# Patient Record
Sex: Male | Born: 1935 | Race: White | Hispanic: No | Smoking: Former smoker
Health system: Southern US, Community
[De-identification: ages and names within clinical notes are randomized; demographics above are authoritative.]

## PROBLEM LIST (undated history)

## (undated) DIAGNOSIS — H3530 Unspecified macular degeneration: Secondary | ICD-10-CM

## (undated) DIAGNOSIS — N189 Chronic kidney disease, unspecified: Secondary | ICD-10-CM

## (undated) DIAGNOSIS — G4730 Sleep apnea, unspecified: Secondary | ICD-10-CM

## (undated) DIAGNOSIS — R29898 Other symptoms and signs involving the musculoskeletal system: Secondary | ICD-10-CM

## (undated) DIAGNOSIS — M1990 Unspecified osteoarthritis, unspecified site: Secondary | ICD-10-CM

## (undated) DIAGNOSIS — H269 Unspecified cataract: Secondary | ICD-10-CM

## (undated) DIAGNOSIS — E119 Type 2 diabetes mellitus without complications: Secondary | ICD-10-CM

## (undated) DIAGNOSIS — I10 Essential (primary) hypertension: Secondary | ICD-10-CM

## (undated) DIAGNOSIS — R0600 Dyspnea, unspecified: Secondary | ICD-10-CM

## (undated) HISTORY — PX: BACK SURGERY: SHX140

## (undated) HISTORY — DX: Unspecified cataract: H26.9

## (undated) HISTORY — DX: Type 2 diabetes mellitus without complications: E11.9

## (undated) HISTORY — DX: Essential (primary) hypertension: I10

## (undated) HISTORY — PX: ANTERIOR CERVICAL DECOMP/DISCECTOMY FUSION: SHX1161

## (undated) HISTORY — PX: COLONOSCOPY: SHX174

## (undated) HISTORY — PX: KNEE SURGERY: SHX244

## (undated) HISTORY — DX: Unspecified macular degeneration: H35.30

## (undated) HISTORY — PX: TONSILLECTOMY: SUR1361

## (undated) HISTORY — PX: EYE SURGERY: SHX253

## (undated) SURGERY — Surgical Case
Anesthesia: *Unknown

---

## 2012-01-04 ENCOUNTER — Other Ambulatory Visit: Payer: Self-pay | Admitting: Sports Medicine

## 2012-01-04 ENCOUNTER — Ambulatory Visit (INDEPENDENT_AMBULATORY_CARE_PROVIDER_SITE_OTHER): Payer: Medicare PPO

## 2012-01-04 ENCOUNTER — Ambulatory Visit (INDEPENDENT_AMBULATORY_CARE_PROVIDER_SITE_OTHER): Payer: Medicare PPO | Admitting: Sports Medicine

## 2012-01-04 ENCOUNTER — Encounter: Payer: Self-pay | Admitting: Sports Medicine

## 2012-01-04 VITALS — BP 162/92 | HR 80 | Wt 300.0 lb

## 2012-01-04 DIAGNOSIS — R609 Edema, unspecified: Secondary | ICD-10-CM

## 2012-01-04 DIAGNOSIS — M1710 Unilateral primary osteoarthritis, unspecified knee: Secondary | ICD-10-CM

## 2012-01-04 DIAGNOSIS — N289 Disorder of kidney and ureter, unspecified: Secondary | ICD-10-CM | POA: Insufficient documentation

## 2012-01-04 DIAGNOSIS — Z0000 Encounter for general adult medical examination without abnormal findings: Secondary | ICD-10-CM | POA: Insufficient documentation

## 2012-01-04 DIAGNOSIS — R600 Localized edema: Secondary | ICD-10-CM

## 2012-01-04 DIAGNOSIS — M1712 Unilateral primary osteoarthritis, left knee: Secondary | ICD-10-CM

## 2012-01-04 DIAGNOSIS — Z299 Encounter for prophylactic measures, unspecified: Secondary | ICD-10-CM

## 2012-01-04 DIAGNOSIS — R29898 Other symptoms and signs involving the musculoskeletal system: Secondary | ICD-10-CM

## 2012-01-04 DIAGNOSIS — I10 Essential (primary) hypertension: Secondary | ICD-10-CM

## 2012-01-04 DIAGNOSIS — L821 Other seborrheic keratosis: Secondary | ICD-10-CM | POA: Insufficient documentation

## 2012-01-04 DIAGNOSIS — IMO0002 Reserved for concepts with insufficient information to code with codable children: Secondary | ICD-10-CM

## 2012-01-04 DIAGNOSIS — E785 Hyperlipidemia, unspecified: Secondary | ICD-10-CM

## 2012-01-04 DIAGNOSIS — M25569 Pain in unspecified knee: Secondary | ICD-10-CM

## 2012-01-04 DIAGNOSIS — L989 Disorder of the skin and subcutaneous tissue, unspecified: Secondary | ICD-10-CM

## 2012-01-04 DIAGNOSIS — G4730 Sleep apnea, unspecified: Secondary | ICD-10-CM

## 2012-01-04 DIAGNOSIS — Z23 Encounter for immunization: Secondary | ICD-10-CM

## 2012-01-04 DIAGNOSIS — N189 Chronic kidney disease, unspecified: Secondary | ICD-10-CM | POA: Insufficient documentation

## 2012-01-04 MED ORDER — AMBULATORY NON FORMULARY MEDICATION: Status: DC

## 2012-01-04 NOTE — Assessment & Plan Note (Signed)
Script for new CPAP machine

## 2012-01-04 NOTE — Assessment & Plan Note (Signed)
Needs Tdap and influenza. Up-to-date on Zostavax, Pneumovax, colonoscopy, PSA.

## 2012-01-04 NOTE — Assessment & Plan Note (Signed)
These appear to be multiple seborrheic keratoses. Return to have cryotherapy.

## 2012-01-04 NOTE — Assessment & Plan Note (Signed)
Currently fasting, checking lipid panel.

## 2012-01-04 NOTE — Progress Notes (Signed)
Subjective:    CC: Establish care.   HPI:  Hypertension: Has been controlled in the past on lisinopril/hydrochlorothiazide.  Hyperlipidemia: Needs recheck of lipids.  Skin lesions: Multiple, on face, and back, would like these checked.  Sleep apnea: Needs a prescription for a new CPAP machine.  Preventative measures: Up-to-date on colonoscopy, shingles vaccine, Pneumovax. Needs flu and tetanus shots.  Past medical history, Surgical history, Family history, Social history, Allergies, and medications have been entered into the medical record, reviewed, and no changes needed.   Review of Systems: No headache, visual changes, nausea, vomiting, diarrhea, constipation, dizziness, abdominal pain, skin rash, fevers, chills, night sweats, swollen lymph nodes, weight loss, chest pain, body aches, joint swelling, muscle aches, shortness of breath, mood changes, visual or auditory hallucinations.  Objective:    General: Well Developed, well nourished, and in no acute distress.  Neuro: Alert and oriented x3, extra-ocular muscles intact.  HEENT: Normocephalic, atraumatic, pupils equal round reactive to light, neck supple, no masses, no lymphadenopathy, thyroid nonpalpable.  Skin: Warm and dry, no rashes noted. Multiple seborrheic keratoses noted on back and face. Cardiac: Regular rate and rhythm, no murmurs rubs or gallops.  Respiratory: Clear to auscultation bilaterally. Not using accessory muscles, speaking in full sentences.  Abdominal: Soft, nontender, nondistended, positive bowel sounds, no masses, no organomegaly.  Musculoskeletal: Shoulder, elbow, wrist, hip, knee, ankle stable, and with full range of motion.  He does have 2+ pitting edema in both lower extremities.  Impression and Recommendations:    The patient was counselled, risk factors were discussed, anticipatory guidance given.  I spent over one hour with this patient, greater than 50% was face-to-face counseling.

## 2012-01-04 NOTE — Assessment & Plan Note (Signed)
Checking CMET, urinalysis, BNP, TSH.

## 2012-01-04 NOTE — Assessment & Plan Note (Signed)
Continue current medications. We'll check ambulatory home blood pressures. We will make changes as needed at his return visit in 2 weeks.

## 2012-01-05 LAB — CBC
HCT: 46.4 % (ref 39.0–52.0)
Hemoglobin: 16.2 g/dL (ref 13.0–17.0)
MCH: 30.7 pg (ref 26.0–34.0)
MCHC: 34.9 g/dL (ref 30.0–36.0)
MCV: 87.9 fL (ref 78.0–100.0)
Platelets: 143 K/uL — ABNORMAL LOW (ref 150–400)
RBC: 5.28 MIL/uL (ref 4.22–5.81)
RDW: 14.3 % (ref 11.5–15.5)
WBC: 9.7 10*3/uL (ref 4.0–10.5)

## 2012-01-05 LAB — URINALYSIS
Bilirubin Urine: NEGATIVE
Glucose, UA: NEGATIVE mg/dL
Hgb urine dipstick: NEGATIVE
Ketones, ur: NEGATIVE mg/dL
Nitrite: NEGATIVE
Protein, ur: NEGATIVE mg/dL
Specific Gravity, Urine: 1.011 (ref 1.005–1.030)
Urobilinogen, UA: 0.2 mg/dL (ref 0.0–1.0)
pH: 6 (ref 5.0–8.0)

## 2012-01-05 LAB — LIPID PANEL
Cholesterol: 173 mg/dL (ref 0–200)
HDL: 38 mg/dL — ABNORMAL LOW (ref >39)
LDL Cholesterol: 95 mg/dL (ref 0–99)
Total CHOL/HDL Ratio: 4.6 ratio
Triglycerides: 202 mg/dL — ABNORMAL HIGH (ref <150)
VLDL: 40 mg/dL (ref 0–40)

## 2012-01-05 LAB — COMPREHENSIVE METABOLIC PANEL
Albumin: 4.7 g/dL (ref 3.5–5.2)
BUN: 15 mg/dL (ref 6–23)
Calcium: 9.4 mg/dL (ref 8.4–10.5)
Chloride: 103 mEq/L (ref 96–112)
Creat: 1.08 mg/dL (ref 0.50–1.35)
Glucose, Bld: 86 mg/dL (ref 70–99)
Potassium: 4.4 mEq/L (ref 3.5–5.3)

## 2012-01-05 LAB — BRAIN NATRIURETIC PEPTIDE: Brain Natriuretic Peptide: 6.2 pg/mL (ref 0.0–100.0)

## 2012-01-05 LAB — COMPREHENSIVE METABOLIC PANEL WITH GFR
ALT: 25 U/L (ref 0–53)
AST: 23 U/L (ref 0–37)
Alkaline Phosphatase: 33 U/L — ABNORMAL LOW (ref 39–117)
CO2: 26 meq/L (ref 19–32)
Sodium: 140 meq/L (ref 135–145)
Total Bilirubin: 0.6 mg/dL (ref 0.3–1.2)
Total Protein: 6.8 g/dL (ref 6.0–8.3)

## 2012-01-05 LAB — TSH: TSH: 2.392 u[IU]/mL (ref 0.350–4.500)

## 2012-01-18 ENCOUNTER — Encounter: Payer: Self-pay | Admitting: Sports Medicine

## 2012-01-18 ENCOUNTER — Ambulatory Visit (INDEPENDENT_AMBULATORY_CARE_PROVIDER_SITE_OTHER): Payer: Medicare PPO | Admitting: Sports Medicine

## 2012-01-18 VITALS — BP 143/88 | HR 66 | Wt 299.0 lb

## 2012-01-18 DIAGNOSIS — IMO0002 Reserved for concepts with insufficient information to code with codable children: Secondary | ICD-10-CM

## 2012-01-18 DIAGNOSIS — I10 Essential (primary) hypertension: Secondary | ICD-10-CM

## 2012-01-18 DIAGNOSIS — R609 Edema, unspecified: Secondary | ICD-10-CM

## 2012-01-18 DIAGNOSIS — M179 Osteoarthritis of knee, unspecified: Secondary | ICD-10-CM | POA: Insufficient documentation

## 2012-01-18 DIAGNOSIS — R600 Localized edema: Secondary | ICD-10-CM

## 2012-01-18 DIAGNOSIS — G4730 Sleep apnea, unspecified: Secondary | ICD-10-CM

## 2012-01-18 DIAGNOSIS — M1710 Unilateral primary osteoarthritis, unspecified knee: Secondary | ICD-10-CM

## 2012-01-18 MED ORDER — MELOXICAM 15 MG PO TABS: ORAL_TABLET | ORAL | Status: DC

## 2012-01-18 MED ORDER — AMLODIPINE BESYLATE 10 MG PO TABS: 5.0000 mg | ORAL_TABLET | Freq: Every day | ORAL | Status: DC

## 2012-01-18 MED ORDER — AMBULATORY NON FORMULARY MEDICATION: Status: DC

## 2012-01-18 NOTE — Assessment & Plan Note (Signed)
Insufficiently controlled with lisinopril/hydrochlorothiazide. I would like to amlodipine 5 mg daily. Come back to see me in 4 weeks.

## 2012-01-18 NOTE — Assessment & Plan Note (Addendum)
Benign.

## 2012-01-18 NOTE — Assessment & Plan Note (Signed)
Ultrasound guided injection as above. Knee strapped elastic bandage. Come back in one month.

## 2012-01-18 NOTE — Assessment & Plan Note (Signed)
Needs humidifier.

## 2012-01-18 NOTE — Progress Notes (Signed)
Subjective:    CC: Followup  HPI: Hypertension: Has been on lisinopril/hydrochlorothiazide for about 2 weeks. No adverse effects.  Left knee pain: With known osteoarthritis, severe, bone-on-bone. His last injection was about 4 months ago and lasted until today. Pain is localized, doesn't radiate, severe. Feels tight.  Past medical history, Surgical history, Family history, Social history, Allergies, and medications have been entered into the medical record, reviewed, and no changes needed.   Review of Systems: No fevers, chills, night sweats, weight loss, chest pain, or shortness of breath.   Objective:    General: Well Developed, well nourished, and in no acute distress.  Neuro: Alert and oriented x3, extra-ocular muscles intact.  HEENT: Normocephalic, atraumatic, pupils equal round reactive to light, neck supple, no masses, no lymphadenopathy, thyroid nonpalpable.  Skin: Warm and dry, no rashes. Cardiac: Regular rate and rhythm, no murmurs rubs or gallops.  Respiratory: Clear to auscultation bilaterally. Not using accessory muscles, speaking in full sentences. Left knee: Normal to inspection with no erythema or effusion or obvious bony abnormalities. Palpation normal with no warmth, joint line tenderness, patellar tenderness, or condyle tenderness. ROM full in flexion and extension and lower leg rotation. Ligaments with solid consistent endpoints including ACL, PCL, LCL, MCL. Negative Mcmurray's, Apley's, and Thessalonian tests. Non painful patellar compression. Patellar glide without crepitus. Patellar and quadriceps tendons unremarkable. Hamstring and quadriceps strength is normal.   Procedure: Real-time Ultrasound Guided Injection of left knee Device: GE Logiq E  Ultrasound guided injection is preferred based studies that show increased duration, increased effect, greater accuracy, decreased procedural pain, increased response rate, and decreased cost with ultrasound guided  versus blind injection.  Verbal informed consent obtained.  Time-out conducted.  Noted no overlying erythema, induration, or other signs of local infection.  Skin prepped in a sterile fashion.  Local anesthesia: Topical Ethyl chloride.  With sterile technique and under real time ultrasound guidance:  2 cc triamcinolone, 4 cc lidocaine injected into the joint.  The knee was then strapped. Completed without difficulty  Pain immediately resolved suggesting accurate placement of the medication.  Advised to call if fevers/chills, erythema, induration, drainage, or persistent bleeding.  Images permanently stored and available for review in the ultrasound unit.  Impression: Technically successful ultrasound guided injection.  Impression and Recommendations:

## 2012-01-21 ENCOUNTER — Other Ambulatory Visit: Payer: Self-pay | Admitting: Sports Medicine

## 2012-01-21 DIAGNOSIS — I10 Essential (primary) hypertension: Secondary | ICD-10-CM

## 2012-01-22 ENCOUNTER — Other Ambulatory Visit: Payer: Self-pay | Admitting: *Deleted

## 2012-01-22 MED ORDER — LISINOPRIL-HYDROCHLOROTHIAZIDE 20-25 MG PO TABS: 1.0000 | ORAL_TABLET | Freq: Every day | ORAL | Status: DC

## 2012-01-29 ENCOUNTER — Ambulatory Visit (INDEPENDENT_AMBULATORY_CARE_PROVIDER_SITE_OTHER): Payer: Medicare PPO | Admitting: Sports Medicine

## 2012-01-29 ENCOUNTER — Encounter: Payer: Self-pay | Admitting: Sports Medicine

## 2012-01-29 ENCOUNTER — Ambulatory Visit (INDEPENDENT_AMBULATORY_CARE_PROVIDER_SITE_OTHER): Payer: Medicare PPO

## 2012-01-29 VITALS — BP 130/73 | HR 94 | Wt 297.0 lb

## 2012-01-29 DIAGNOSIS — M179 Osteoarthritis of knee, unspecified: Secondary | ICD-10-CM

## 2012-01-29 DIAGNOSIS — M48062 Spinal stenosis, lumbar region with neurogenic claudication: Secondary | ICD-10-CM

## 2012-01-29 DIAGNOSIS — I10 Essential (primary) hypertension: Secondary | ICD-10-CM

## 2012-01-29 DIAGNOSIS — M5137 Other intervertebral disc degeneration, lumbosacral region: Secondary | ICD-10-CM

## 2012-01-29 DIAGNOSIS — IMO0002 Reserved for concepts with insufficient information to code with codable children: Secondary | ICD-10-CM

## 2012-01-29 DIAGNOSIS — M1710 Unilateral primary osteoarthritis, unspecified knee: Secondary | ICD-10-CM

## 2012-01-29 MED ORDER — GABAPENTIN 300 MG PO CAPS: ORAL_CAPSULE | ORAL | Status: DC

## 2012-01-29 MED ORDER — PREDNISONE 50 MG PO TABS: ORAL_TABLET | ORAL | Status: DC

## 2012-01-29 NOTE — Assessment & Plan Note (Signed)
Improved significantly, still awaiting the lisinopril/hydrochlorothiazide combination pill from his mail-order pharmacy. 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

## 2012-01-29 NOTE — Progress Notes (Signed)
SPORTS MEDICINE CONSULTATION REPORT  Subjective:    CC: Feet numbness  HPI: Steven Patton comes back to see me to discuss 3-4 years of numbness that he localizes on the medial aspect and plantar aspect of both feet, radiating down his legs. He notes only mild back pain. He denies any trauma. He notes that this is particularly bad associated with calf cramping after walking for long distances, flexing his spine relieves all symptoms. In fact, he does particularly like to push the grocery cart. He denies any bowel or bladder changes. He also has had ABIs in the past that were negative. He's never had any evaluation or treatment for lumbar spinal stenosis.  Past medical history, Surgical history, Family history, Social history, Allergies, and medications have been entered into the medical record, reviewed, and no changes needed.   Review of Systems: No headache, visual changes, nausea, vomiting, diarrhea, constipation, dizziness, abdominal pain, skin rash, fevers, chills, night sweats, weight loss, swollen lymph nodes, body aches, joint swelling, muscle aches, chest pain, shortness of breath, mood changes, visual or auditory hallucinations.   Objective:   Vitals:  Afebrile, vital signs stable. General: Well Developed, well nourished, and in no acute distress.  Neuro/Psych: Alert and oriented x3, extra-ocular muscles intact, able to move all 4 extremities.  Skin: Warm and dry, no rashes noted.  Respiratory: Not using accessory muscles, speaking in full sentences, trachea midline.  Cardiovascular: Pulses palpable, no extremity edema. Abdomen: Does not appear distended. Back Exam:  Inspection: Unremarkable  Motion: Flexion 45 deg, Extension 45 deg, Side Bending to 45 deg bilaterally,  Rotation to 45 deg bilaterally  SLR laying: Negative  XSLR laying: Negative  Palpable tenderness: None. FABER: negative. Sensory change: Gross sensation intact to all lumbar and sacral dermatomes.  Reflexes: 2+ at both  patellar tendons, 2+ at achilles tendons, Babinski's downgoing.  Strength at foot  Plantar-flexion: 5/5 Dorsi-flexion: 5/5 Eversion: 5/5 Inversion: 5/5  Leg strength  Quad: 5/5 Hamstring: 5/5 Hip flexor: 5/5 Hip abductors: 5/5   Impression and Recommendations:   This case required medical decision making of moderate complexity.

## 2012-01-29 NOTE — Assessment & Plan Note (Signed)
Doing very well status post injection.

## 2012-01-29 NOTE — Patient Instructions (Addendum)
Spinal Stenosis One cause of back pain is spinal stenosis. Stenosis means abnormal narrowing. The spinal canal contains and protects the spinal nerve roots. In spinal stenosis, the spinal canal narrows and pinches the spinal cord and nerves. This causes low back pain and pain in the legs. Stenosis may pinch the nerves that control muscles and sensation in the legs. This leads to pain and abnormal feelings in the leg muscles and areas supplied by those nerves. CAUSES  Spinal stenosis often happens to people as they get older and arthritic boney growths occur in their spinal canal. There is also a loss of the disk height between the bones of the back, which also adds to this problem. Sometimes the problem is present at birth. SYMPTOMS   Pain that is generally worse with activities, particularly standing and walking.  Numbness, tingling, hot or cold feelings, weakness, or a weariness in the legs.  Clumsiness, frequent falling, and a foot-slapping gait, which may come as a result of nerve pressure and muscle weakness. DIAGNOSIS   Your caregiver may suspect spinal stenosis if you have unusual leg symptoms, such as those previously mentioned.  Your orthopedic surgeon may request special imaging exams, such a computerized magnetic scan (MRI) or computerized X-ray scan (CT) to find out the cause of the problem. TREATMENT   Sometimes treatments such as postural changes or nonsteroidal anti-inflammatory drugs will relieve the pain.  Nonsteroidal anti-inflammatory medications may help relieve symptoms. These medicines do this by decreasing swelling and inflammation in the nerves.  When stenosis causes severe nerve root compression, conservative treatment may not be enough to maintain a normal lifestyle. Surgery may be recommended to relieve the pressure on affected nerves. In properly selected patients, the results are very good, and patients are able to continue a normal lifestyle. HOME CARE  INSTRUCTIONS   Flexing the spine by leaning forward while walking may relieve symptoms. Lying with the knees drawn up to the chest may offer some relief. These positions enlarge the space available to the nerves. They may make it easier for stenosis sufferers to walk longer distances.  Rest, followed by gradually resuming activity, also can help.  Aerobic activity, such as bicycling or swimming, is often recommended.  Losing weight can also relieve some of the load on the spine.  Application of warm or cold compresses to the area of pain can be helpful. SEEK MEDICAL CARE IF:   The periods of relief between episodes of pain become shorter and shorter.  You experience pain that radiates down your leg, even when you are not standing or walking. SEEK IMMEDIATE MEDICAL CARE IF:   You have a loss of bowel or bladder control.  You have a sudden loss of feeling in your legs.  You suddenly cannot move your legs. Document Released: 04/11/2003 Document Revised: 04/13/2011 Document Reviewed: 06/06/2009 [employer] Patient Information 2013 [employer].  

## 2012-01-29 NOTE — Assessment & Plan Note (Signed)
Symptoms are highly suggestive of the above. I do suspect L5 nerve root bilaterally. Prednisone for 5 days, gabapentin, formal physical therapy, x-rays. He will come back to see me after 4 weeks of therapy and if no better we can proceed with MRI.  This will be for interventional injection plan.

## 2012-02-08 ENCOUNTER — Ambulatory Visit: Payer: Medicare PPO | Attending: Sports Medicine | Admitting: Physical Therapy

## 2012-02-08 DIAGNOSIS — IMO0001 Reserved for inherently not codable concepts without codable children: Secondary | ICD-10-CM | POA: Insufficient documentation

## 2012-02-08 DIAGNOSIS — M4040 Postural lordosis, site unspecified: Secondary | ICD-10-CM | POA: Insufficient documentation

## 2012-02-08 DIAGNOSIS — M6281 Muscle weakness (generalized): Secondary | ICD-10-CM | POA: Insufficient documentation

## 2012-02-08 DIAGNOSIS — R293 Abnormal posture: Secondary | ICD-10-CM | POA: Insufficient documentation

## 2012-02-11 ENCOUNTER — Ambulatory Visit: Payer: Medicare PPO | Admitting: Physical Therapy

## 2012-02-15 ENCOUNTER — Ambulatory Visit: Payer: Medicare PPO | Admitting: Physical Therapy

## 2012-02-18 ENCOUNTER — Ambulatory Visit: Payer: Medicare PPO | Admitting: Physical Therapy

## 2012-02-19 ENCOUNTER — Ambulatory Visit: Payer: Medicare PPO | Admitting: Sports Medicine

## 2012-02-22 ENCOUNTER — Ambulatory Visit: Payer: Medicare PPO | Admitting: Physical Therapy

## 2012-02-25 ENCOUNTER — Ambulatory Visit: Payer: Medicare PPO | Admitting: Physical Therapy

## 2012-02-26 ENCOUNTER — Ambulatory Visit (INDEPENDENT_AMBULATORY_CARE_PROVIDER_SITE_OTHER): Payer: Medicare PPO | Admitting: Sports Medicine

## 2012-02-26 ENCOUNTER — Encounter: Payer: Self-pay | Admitting: Sports Medicine

## 2012-02-26 VITALS — BP 156/77 | HR 68 | Wt 300.0 lb

## 2012-02-26 DIAGNOSIS — IMO0002 Reserved for concepts with insufficient information to code with codable children: Secondary | ICD-10-CM

## 2012-02-26 DIAGNOSIS — M48062 Spinal stenosis, lumbar region with neurogenic claudication: Secondary | ICD-10-CM

## 2012-02-26 DIAGNOSIS — M179 Osteoarthritis of knee, unspecified: Secondary | ICD-10-CM

## 2012-02-26 DIAGNOSIS — M1710 Unilateral primary osteoarthritis, unspecified knee: Secondary | ICD-10-CM

## 2012-02-26 DIAGNOSIS — I10 Essential (primary) hypertension: Secondary | ICD-10-CM

## 2012-02-26 DIAGNOSIS — I209 Angina pectoris, unspecified: Secondary | ICD-10-CM

## 2012-02-26 MED ORDER — NITROGLYCERIN 0.4 MG SL SUBL: 0.4000 mg | SUBLINGUAL_TABLET | SUBLINGUAL | Status: DC | PRN

## 2012-02-26 MED ORDER — ASPIRIN EC 81 MG PO TBEC: 81.0000 mg | DELAYED_RELEASE_TABLET | Freq: Every day | ORAL | Status: DC

## 2012-02-26 MED ORDER — METOPROLOL TARTRATE 25 MG PO TABS: 25.0000 mg | ORAL_TABLET | Freq: Two times a day (BID) | ORAL | Status: DC

## 2012-02-26 NOTE — Assessment & Plan Note (Signed)
Pain free.

## 2012-02-26 NOTE — Progress Notes (Signed)
Subjective:    CC: Followup  HPI: Spinal stenosis: Has been to formal physical therapy, oral steroids, muscle relaxers, gabapentin. Currently has zero pain, does still get some numbness in his feet with ambulation which resolves with forward flexion, but does not desire any further intervention due to lack of pain.  Hypertension: Doing well with current medications.  Angina: 2 weeks ago noted an episode of right-sided substernal chest pain that was sharp, nonpleuritic, and occurred at rest. It lasted approximately 15 minutes, radiated into his neck and jaw, but not his arm, he denies any presyncope, nausea, or diaphoresis during this episode. He is able to push the grocery cart around, and does not get any symptoms. He last had a stress test at least 5 years ago.  Knee osteoarthritis: pain is completely resolved status post intra-articular injection.  Past medical history, Surgical history, Family history not pertinant except as noted below, Social history, Allergies, and medications have been entered into the medical record, reviewed, and no changes needed.   Review of Systems: No fevers, chills, night sweats, weight loss, chest pain, or shortness of breath.   Objective:    General: Well Developed, well nourished, and in no acute distress.  Neuro: Alert and oriented x3, extra-ocular muscles intact, sensation grossly intact.  HEENT: Normocephalic, atraumatic, pupils equal round reactive to light, neck supple, no masses, no lymphadenopathy, thyroid nonpalpable.  Skin: Warm and dry, no rashes. Cardiac: Regular rate and rhythm, no murmurs rubs or gallops. Pain in chest is not reproducible with palpation. Respiratory: Clear to auscultation bilaterally. Not using accessory muscles, speaking in full sentences.  Impression and Recommendations:

## 2012-02-26 NOTE — Assessment & Plan Note (Signed)
Pain resolved after physical therapy. Still has some numbness with walking, but desires not to further investigate this due to no further pain.

## 2012-02-26 NOTE — Assessment & Plan Note (Signed)
Increase amlodipine to one full tablet. Recheck in 2 weeks.

## 2012-02-26 NOTE — Assessment & Plan Note (Signed)
Single episode 2 weeks ago, radiating to the jaws, no nausea, no diaphoresis, was at rest. No pre-syncope. I do think he needs to see our cardiologist for stress testing, I'm going to give him some nitroglycerin, aspirin, and place him on low-dose metoprolol.

## 2012-03-11 ENCOUNTER — Ambulatory Visit: Payer: Medicare PPO | Admitting: Sports Medicine

## 2012-03-14 ENCOUNTER — Encounter: Payer: Self-pay | Admitting: Sports Medicine

## 2012-03-14 ENCOUNTER — Ambulatory Visit (INDEPENDENT_AMBULATORY_CARE_PROVIDER_SITE_OTHER): Payer: Medicare PPO | Admitting: Sports Medicine

## 2012-03-14 VITALS — BP 132/76 | HR 60 | Wt 297.0 lb

## 2012-03-14 DIAGNOSIS — I10 Essential (primary) hypertension: Secondary | ICD-10-CM

## 2012-03-14 DIAGNOSIS — I209 Angina pectoris, unspecified: Secondary | ICD-10-CM

## 2012-03-14 NOTE — Progress Notes (Signed)
Subjective:    CC: Followup  HPI: Hypertension: Currently well controlled on amlodipine 10, lisinopril/hydrochlorothiazide maximal dose, metoprolol.  Angina: Has not had any further episodes since I saw him, he does keep the nitroglycerin in his pocket. He is not yet gotten his visit with the cardiologist.  Past medical history, Surgical history, Family history not pertinant except as noted below, Social history, Allergies, and medications have been entered into the medical record, reviewed, and no changes needed.   Review of Systems: No fevers, chills, night sweats, weight loss, chest pain, or shortness of breath.   Objective:    General: Well Developed, well nourished, and in no acute distress.  Neuro: Alert and oriented x3, extra-ocular muscles intact, sensation grossly intact.  HEENT: Normocephalic, atraumatic, pupils equal round reactive to light, neck supple, no masses, no lymphadenopathy, thyroid nonpalpable.  Skin: Warm and dry, no rashes. Cardiac: Regular rate and rhythm, no murmurs rubs or gallops.  Respiratory: Clear to auscultation bilaterally. Not using accessory muscles, speaking in full sentences. Impression and Recommendations:    I spent 20 minutes with this patient, greater than 50% was face-to-face counseling regarding hypertension and angina pectoris.

## 2012-03-14 NOTE — Assessment & Plan Note (Signed)
I think that Steven Patton is doing very well in regards to his blood pressure. No changes.

## 2012-03-14 NOTE — Assessment & Plan Note (Signed)
Has still not been scheduled for his visit with the cardiologist. He's really not had any further episodes of angina, but he does have some nitroglycerin on hand just in case. I do think he needs to be stressed, or go straight to catheterization. We will make sure this is set up before he leaves today.

## 2012-04-28 ENCOUNTER — Other Ambulatory Visit: Payer: Self-pay | Admitting: *Deleted

## 2012-04-28 DIAGNOSIS — M1710 Unilateral primary osteoarthritis, unspecified knee: Secondary | ICD-10-CM

## 2012-04-28 MED ORDER — MELOXICAM 15 MG PO TABS: ORAL_TABLET | ORAL | Status: DC

## 2012-05-18 ENCOUNTER — Ambulatory Visit: Payer: Medicare PPO | Admitting: Sports Medicine

## 2012-05-18 ENCOUNTER — Ambulatory Visit (INDEPENDENT_AMBULATORY_CARE_PROVIDER_SITE_OTHER): Payer: Medicare PPO | Admitting: Sports Medicine

## 2012-05-18 ENCOUNTER — Encounter: Payer: Self-pay | Admitting: Sports Medicine

## 2012-05-18 VITALS — BP 121/62 | HR 75 | Wt 306.0 lb

## 2012-05-18 DIAGNOSIS — M1610 Unilateral primary osteoarthritis, unspecified hip: Secondary | ICD-10-CM

## 2012-05-18 DIAGNOSIS — M169 Osteoarthritis of hip, unspecified: Secondary | ICD-10-CM

## 2012-05-18 DIAGNOSIS — M1612 Unilateral primary osteoarthritis, left hip: Secondary | ICD-10-CM

## 2012-05-18 DIAGNOSIS — I209 Angina pectoris, unspecified: Secondary | ICD-10-CM

## 2012-05-18 DIAGNOSIS — Z96649 Presence of unspecified artificial hip joint: Secondary | ICD-10-CM | POA: Insufficient documentation

## 2012-05-18 NOTE — Progress Notes (Signed)
  Subjective:    CC: Pain  HPI: Left hip pain: This very pleasant 77 year old male comes in with a several week history of pain he localizes in his left groin, radiating to the left testicle, and to the medial aspect of the left knee but not beyond the knee. It is worse with flexion of the hip, and with weightbearing. He denies any injuries, denies any penile swelling, discharge, dysuria. Pain is sharp. Moderate to severe. Stable.  Past medical history, Surgical history, Family history not pertinant except as noted below, Social history, Allergies, and medications have been entered into the medical record, reviewed, and no changes needed.   Review of Systems: No fevers, chills, night sweats, weight loss, chest pain, or shortness of breath.   Objective:    General: Well Developed, well nourished, and in no acute distress.  Neuro: Alert and oriented x3, extra-ocular muscles intact, sensation grossly intact.  HEENT: Normocephalic, atraumatic, pupils equal round reactive to light, neck supple, no masses, no lymphadenopathy, thyroid nonpalpable.  Skin: Warm and dry, no rashes. Cardiac: Regular rate and rhythm, no murmurs rubs or gallops, no lower extremity edema.  Respiratory: Clear to auscultation bilaterally. Not using accessory muscles, speaking in full sentences. Left Hip: ROM IR: 25 and painful, ER: 45 Deg, Flexion: 120 Deg, Extension: 100 Deg, Abduction: 45 Deg, Adduction: 45 Deg Strength IR: 5/5, ER: 5/5, Flexion: 5/5, Extension: 5/5, Abduction: 5/5, Adduction: 5/5 Pelvic alignment unremarkable to inspection and palpation. Standing hip rotation and gait without trendelenburg sign / unsteadiness. Greater trochanter without tenderness to palpation. No tenderness over piriformis and greater trochanter. Reproduction of pain with flexion adduction internal rotation. No SI joint tenderness and normal minimal SI movement.  Procedure: Real-time Ultrasound Guided Injection of left femoral  acetabular joint Device: GE Logiq E  Ultrasound guided injection is preferred based studies that show increased duration, increased effect, greater accuracy, decreased procedural pain, increased response rate, and decreased cost with ultrasound guided versus blind injection.  Verbal informed consent obtained.  Time-out conducted.  Noted no overlying erythema, induration, or other signs of local infection.  Skin prepped in a sterile fashion.  Local anesthesia: Topical Ethyl chloride.  With sterile technique and under real time ultrasound guidance: Spinal needle directed towards femoral head/neck junction, once confirmed in joint capsule, 2 cc Kenalog 40, 4 cc lidocaine injected easily. Capsule seen distended with fluid.  Completed without difficulty  Pain immediately resolved suggesting accurate placement of the medication.  Advised to call if fevers/chills, erythema, induration, drainage, or persistent bleeding.  Images permanently stored and available for review in the ultrasound unit.  Impression: Technically successful ultrasound guided injection.  Impression and Recommendations:

## 2012-05-18 NOTE — Assessment & Plan Note (Signed)
Intra-articular injection as above. X-rays. Hip rehabilitation exercises. Return in 4 weeks for this.  Of note, he was to come back for custom orthotics.

## 2012-05-18 NOTE — Assessment & Plan Note (Signed)
His visit is coming up soon with the cardiologist.

## 2012-05-23 ENCOUNTER — Encounter: Payer: Self-pay | Admitting: Sports Medicine

## 2012-05-23 ENCOUNTER — Ambulatory Visit (INDEPENDENT_AMBULATORY_CARE_PROVIDER_SITE_OTHER): Payer: Medicare PPO | Admitting: Sports Medicine

## 2012-05-23 VITALS — BP 156/81 | HR 74

## 2012-05-23 DIAGNOSIS — M48062 Spinal stenosis, lumbar region with neurogenic claudication: Secondary | ICD-10-CM

## 2012-05-23 DIAGNOSIS — M1612 Unilateral primary osteoarthritis, left hip: Secondary | ICD-10-CM

## 2012-05-23 DIAGNOSIS — IMO0002 Reserved for concepts with insufficient information to code with codable children: Secondary | ICD-10-CM

## 2012-05-23 DIAGNOSIS — M179 Osteoarthritis of knee, unspecified: Secondary | ICD-10-CM

## 2012-05-23 DIAGNOSIS — M1710 Unilateral primary osteoarthritis, unspecified knee: Secondary | ICD-10-CM

## 2012-05-23 MED ORDER — TRAMADOL HCL 50 MG PO TABS: 50.0000 mg | ORAL_TABLET | Freq: Three times a day (TID) | ORAL | Status: DC | PRN

## 2012-05-23 NOTE — Assessment & Plan Note (Signed)
Left knee injected under ultrasound guidance. Custom orthotics made. Return in 4 weeks.

## 2012-05-23 NOTE — Progress Notes (Signed)
    Patient was fitted for a : standard, cushioned, semi-rigid orthotic. The orthotic was heated and afterward the patient stood on the orthotic blank positioned on the orthotic stand. The patient was positioned in subtalar neutral position and 10 degrees of ankle dorsiflexion in a weight bearing stance. After completion of molding, a stable base was applied to the orthotic blank. The blank was ground to a stable position for weight bearing. Size: 11 Base: Blue EVA Additional Posting and Padding: None The patient ambulated these, and they were very comfortable.  Procedure: Real-time Ultrasound Guided Injection of left knee  Device: GE Logiq E  Ultrasound guided injection is preferred based studies that show increased duration, increased effect, greater accuracy, decreased procedural pain, increased response rate, and decreased cost with ultrasound guided versus blind injection.  Verbal informed consent obtained.  Time-out conducted.  Noted no overlying erythema, induration, or other signs of local infection.  Skin prepped in a sterile fashion.  Local anesthesia: Topical Ethyl chloride.  With sterile technique and under real time ultrasound guidance:  2 cc Kenalog 40, 4 cc lidocaine injected easily into the suprapatellar recess. Completed without difficulty  Pain immediately resolved suggesting accurate placement of the medication.  Advised to call if fevers/chills, erythema, induration, drainage, or persistent bleeding.  Images permanently stored and available for review in the ultrasound unit.  Impression: Technically successful ultrasound guided injection.  I spent 40 minutes with this patient, greater than 50% was face-to-face time counseling regarding the below diagnosis.

## 2012-05-23 NOTE — Assessment & Plan Note (Signed)
Pain resolved after intra-articular injection.

## 2012-05-23 NOTE — Assessment & Plan Note (Signed)
Custom orthotics as above. 

## 2012-05-30 ENCOUNTER — Ambulatory Visit (INDEPENDENT_AMBULATORY_CARE_PROVIDER_SITE_OTHER): Payer: Medicare PPO | Admitting: Sports Medicine

## 2012-05-30 ENCOUNTER — Encounter: Payer: Self-pay | Admitting: Sports Medicine

## 2012-05-30 ENCOUNTER — Ambulatory Visit (INDEPENDENT_AMBULATORY_CARE_PROVIDER_SITE_OTHER): Payer: Medicare PPO

## 2012-05-30 VITALS — BP 147/80 | HR 88 | Wt 296.0 lb

## 2012-05-30 DIAGNOSIS — M169 Osteoarthritis of hip, unspecified: Secondary | ICD-10-CM

## 2012-05-30 DIAGNOSIS — IMO0002 Reserved for concepts with insufficient information to code with codable children: Secondary | ICD-10-CM

## 2012-05-30 DIAGNOSIS — M1612 Unilateral primary osteoarthritis, left hip: Secondary | ICD-10-CM

## 2012-05-30 DIAGNOSIS — M1710 Unilateral primary osteoarthritis, unspecified knee: Secondary | ICD-10-CM

## 2012-05-30 DIAGNOSIS — M179 Osteoarthritis of knee, unspecified: Secondary | ICD-10-CM

## 2012-05-30 NOTE — Assessment & Plan Note (Signed)
Continues to do well after injection. 

## 2012-05-30 NOTE — Assessment & Plan Note (Signed)
S/p 2 injections. Repeated steroid and started supartz today. Return in 1 week for injection number 2 of 5.

## 2012-05-30 NOTE — Progress Notes (Signed)
  Subjective:    CC: Followup  HPI: Hip osteoarthritis: Continues to do well after intra-articular injection.  Left knee pain: Status post 2 injections, the most recent of which was a week ago. Unfortunately pains recurred.  The most recent injection was one week ago. He's never had Visco supplementation. Pain is localized over the anterolateral knee joint, worse with weightbearing.  Past medical history, Surgical history, Family history not pertinant except as noted below, Social history, Allergies, and medications have been entered into the medical record, reviewed, and no changes needed.   Review of Systems: No fevers, chills, night sweats, weight loss, chest pain, or shortness of breath.   Objective:    General: Well Developed, well nourished, and in no acute distress.  Neuro: Alert and oriented x3, extra-ocular muscles intact, sensation grossly intact.  HEENT: Normocephalic, atraumatic, pupils equal round reactive to light, neck supple, no masses, no lymphadenopathy, thyroid nonpalpable.  Skin: Warm and dry, no rashes. Cardiac: Regular rate and rhythm, no murmurs rubs or gallops, no lower extremity edema.  Respiratory: Clear to auscultation bilaterally. Not using accessory muscles, speaking in full sentences. Left Hip: ROM IR: 45 Deg, ER: 45 Deg, Flexion: 120 Deg, Extension: 100 Deg, Abduction: 45 Deg, Adduction: 45 Deg Strength IR: 5/5, ER: 5/5, Flexion: 3+/5, Extension: 5/5, Abduction: 5/5, Adduction: 5/5 Pelvic alignment unremarkable to inspection and palpation. Standing hip rotation and gait without trendelenburg sign / unsteadiness. Greater trochanter without tenderness to palpation. No tenderness over piriformis and greater trochanter. No pain with FABER or FADIR. No SI joint tenderness and normal minimal SI movement. Left Knee: Normal to inspection with no erythema or effusion or obvious bony abnormalities. Mild medial and lateral joint line pain. ROM full in flexion  and extension and lower leg rotation. Ligaments with solid consistent endpoints including ACL, PCL, LCL, MCL. Negative Mcmurray's, Apley's, and Thessalonian tests. Non painful patellar compression. Patellar glide without crepitus. Patellar and quadriceps tendons unremarkable. Hamstring and quadriceps strength is normal.   Procedure: Real-time Ultrasound Guided Injection of left knee Device: GE Logiq E  Ultrasound guided injection is preferred based studies that show increased duration, increased effect, greater accuracy, decreased procedural pain, increased response rate, and decreased cost with ultrasound guided versus blind injection.  Verbal informed consent obtained.  Time-out conducted.  Noted no overlying erythema, induration, or other signs of local infection.  Skin prepped in a sterile fashion.  Local anesthesia: Topical Ethyl chloride.  With sterile technique and under real time ultrasound guidance:  22-gauge needle advanced into the suprapatellar recess, 2 cc Kenalog 40, 4 cc lidocaine injected easily, syringe switched and 25 mg/2.5 mL of Supartz (sodium hyaluronate) in a prefilled syringe was injected easily into the knee. Completed without difficulty  Pain immediately resolved suggesting accurate placement of the medication.  Advised to call if fevers/chills, erythema, induration, drainage, or persistent bleeding.  Images permanently stored and available for review in the ultrasound unit.  Impression: Technically successful ultrasound guided injection. Impression and Recommendations:

## 2012-06-06 ENCOUNTER — Encounter: Payer: Self-pay | Admitting: Sports Medicine

## 2012-06-06 ENCOUNTER — Ambulatory Visit (INDEPENDENT_AMBULATORY_CARE_PROVIDER_SITE_OTHER): Payer: Medicare PPO | Admitting: Sports Medicine

## 2012-06-06 VITALS — BP 128/76 | HR 82

## 2012-06-06 DIAGNOSIS — I10 Essential (primary) hypertension: Secondary | ICD-10-CM

## 2012-06-06 DIAGNOSIS — IMO0002 Reserved for concepts with insufficient information to code with codable children: Secondary | ICD-10-CM

## 2012-06-06 DIAGNOSIS — M1612 Unilateral primary osteoarthritis, left hip: Secondary | ICD-10-CM

## 2012-06-06 DIAGNOSIS — I209 Angina pectoris, unspecified: Secondary | ICD-10-CM

## 2012-06-06 DIAGNOSIS — M1712 Unilateral primary osteoarthritis, left knee: Secondary | ICD-10-CM

## 2012-06-06 DIAGNOSIS — M1710 Unilateral primary osteoarthritis, unspecified knee: Secondary | ICD-10-CM

## 2012-06-06 DIAGNOSIS — M169 Osteoarthritis of hip, unspecified: Secondary | ICD-10-CM

## 2012-06-06 DIAGNOSIS — M179 Osteoarthritis of knee, unspecified: Secondary | ICD-10-CM

## 2012-06-06 DIAGNOSIS — M1610 Unilateral primary osteoarthritis, unspecified hip: Secondary | ICD-10-CM

## 2012-06-06 MED ORDER — METOPROLOL TARTRATE 50 MG PO TABS: 50.0000 mg | ORAL_TABLET | Freq: Two times a day (BID) | ORAL | Status: DC

## 2012-06-06 NOTE — Assessment & Plan Note (Addendum)
Continue aspirin. Increasing metoprolol to 50 mg twice a day to decrease myocardial demand. Lipids were under fairly good control, without a statin. Blood pressure is under good control. Risk factors as above are fairly optimized. Continue nitroglycerin as needed. Keep close followup with the cardiologist, I do suspect he will proceed to catheterization sooner rather than later. We are going to try to get him in tomorrow or the day after rather than his appointment in 2 weeks.

## 2012-06-06 NOTE — Assessment & Plan Note (Signed)
Injection to the left hip joint today. Last injection was only one month ago. If continues to have pain, we need to consider a hip arthroplasty. Certainly we would need cardiac catheterization first.

## 2012-06-06 NOTE — Progress Notes (Signed)
  Subjective:    CC: Pain  HPI: Left knee osteoarthritis: here for Supartz injection #2 of 5, unfortunately has not yet had a response, we did place steroid and Supartz in his knee last time.  Left hip osteoarthritis: I injected his femoral acetabular joint a month ago, he had an excellent response, but unfortunately pain has recurred.  Angina:  Did have a single episode that took 3 nitroglycerin to resolve. He does have his visit coming up with the cardiologist in a couple of weeks.  This occurred at rest.  Past medical history, Surgical history, Family history not pertinant except as noted below, Social history, Allergies, and medications have been entered into the medical record, reviewed, and no changes needed.   Review of Systems: No fevers, chills, night sweats, weight loss, chest pain, or shortness of breath.   Objective:    General: Well Developed, well nourished, and in no acute distress.  Neuro: Alert and oriented x3, extra-ocular muscles intact, sensation grossly intact.  HEENT: Normocephalic, atraumatic, pupils equal round reactive to light, neck supple, no masses, no lymphadenopathy, thyroid nonpalpable.  Skin: Warm and dry, no rashes. Cardiac: Regular rate and rhythm, no murmurs rubs or gallops, no lower extremity edema.  Respiratory: Clear to auscultation bilaterally. Not using accessory muscles, speaking in full sentences.  Procedure: Real-time Ultrasound Guided Injection of left knee Device: GE Logiq E  Ultrasound guided injection is preferred based studies that show increased duration, increased effect, greater accuracy, decreased procedural pain, increased response rate, and decreased cost with ultrasound guided versus blind injection.  Verbal informed consent obtained.  Time-out conducted.  Noted no overlying erythema, induration, or other signs of local infection.  Skin prepped in a sterile fashion.  Local anesthesia: Topical Ethyl chloride.  With sterile  technique and under real time ultrasound guidance:  25 mg/2.5 mL of Supartz (sodium hyaluronate) in a prefilled syringe was injected easily into the knee through a 22-gauge needle. Completed without difficulty  Pain immediately resolved suggesting accurate placement of the medication.  Advised to call if fevers/chills, erythema, induration, drainage, or persistent bleeding.  Images permanently stored and available for review in the ultrasound unit.  Impression: Technically successful ultrasound guided injection.  Procedure: Real-time Ultrasound Guided Injection of left femoral acetabular joint Device: GE Logiq E  Ultrasound guided injection is preferred based studies that show increased duration, increased effect, greater accuracy, decreased procedural pain, increased response rate, and decreased cost with ultrasound guided versus blind injection.  Verbal informed consent obtained.  Time-out conducted.  Noted no overlying erythema, induration, or other signs of local infection.  Skin prepped in a sterile fashion.  Local anesthesia: Topical Ethyl chloride.  With sterile technique and under real time ultrasound guidance:  Spinal needle advanced to the femoral head/neck junction, 2 cc Kenalog 40, 4 cc lidocaine injected easily. Completed without difficulty  Pain immediately resolved suggesting accurate placement of the medication.  Advised to call if fevers/chills, erythema, induration, drainage, or persistent bleeding.  Images permanently stored and available for review in the ultrasound unit.  Impression: Technically successful ultrasound guided injection.  Impression and Recommendations:

## 2012-06-06 NOTE — Assessment & Plan Note (Signed)
Well controlled, no changes 

## 2012-06-06 NOTE — Assessment & Plan Note (Signed)
Supartz injection #2 of 5 today. Return in one week for #3 of 5.

## 2012-06-08 ENCOUNTER — Encounter: Payer: Self-pay | Admitting: Cardiology

## 2012-06-08 ENCOUNTER — Ambulatory Visit (INDEPENDENT_AMBULATORY_CARE_PROVIDER_SITE_OTHER): Payer: Medicare PPO | Admitting: Cardiology

## 2012-06-08 ENCOUNTER — Ambulatory Visit (HOSPITAL_BASED_OUTPATIENT_CLINIC_OR_DEPARTMENT_OTHER)
Admission: RE | Admit: 2012-06-08 | Discharge: 2012-06-08 | Disposition: A | Discharge status: Home / Self Care | Payer: Medicare PPO | Source: Ambulatory Visit | Attending: Cardiology | Admitting: Cardiology

## 2012-06-08 ENCOUNTER — Encounter: Payer: Self-pay | Admitting: *Deleted

## 2012-06-08 VITALS — BP 120/70 | HR 94 | Wt 295.0 lb

## 2012-06-08 DIAGNOSIS — R079 Chest pain, unspecified: Secondary | ICD-10-CM

## 2012-06-08 DIAGNOSIS — I10 Essential (primary) hypertension: Secondary | ICD-10-CM

## 2012-06-08 DIAGNOSIS — R0602 Shortness of breath: Secondary | ICD-10-CM | POA: Insufficient documentation

## 2012-06-08 LAB — CBC
MCV: 88.5 fL (ref 78.0–100.0)
Platelets: 170 10*3/uL (ref 150–400)
RDW: 13.8 % (ref 11.5–15.5)
WBC: 13.7 10*3/uL — ABNORMAL HIGH (ref 4.0–10.5)

## 2012-06-08 NOTE — Assessment & Plan Note (Signed)
Patient symptoms are concerning. He describes pain at rest one week ago radiating to his jaws creating what was felt to be a toothache. There was improvement with nitroglycerin. I feel definitive evaluation is indicated. Plan to proceed with cardiac catheterization. The risks and benefits were discussed and the patient agrees to proceed.

## 2012-06-08 NOTE — Progress Notes (Signed)
HPI: 77-year-old male with no prior cardiac history for evaluation of chest pain. The patient states he has had 4-5 episodes of chest pain in the past 3 years. He had an episode Easter that awoke him from sleep. His most recent episode occurred approximately one week ago while riding in his car. The pain is epigastric in location and described as a stabbing pain. It radiates to his neck and he feels pain in his teeth described as a toothache. No associated symptoms. The pain is not pleuritic, positional or related to food. He had nitroglycerin at his primary care physician had given him at the time of his most recent event. He took 3 with eventual resolution of his pain. He has had no pain in the past one week. He denies exertional chest pain, dyspnea on exertion, pedal edema or syncope. No melena, hematochezia or acholic stools.  Current Outpatient Prescriptions  Medication Sig Dispense Refill  . AMBULATORY NON FORMULARY MEDICATION [employer] CPAP Machine, needs humidifier to fit CPAP machine.  1 each  0  . amLODipine (NORVASC) 10 MG tablet Take 1 tablet (10 mg total) by mouth daily.  90 tablet  3  . aspirin EC 81 MG tablet Take 1 tablet (81 mg total) by mouth daily.  90 tablet  3  . gabapentin (NEURONTIN) 300 MG capsule 4 tabs PO TID.      . lisinopril-hydrochlorothiazide (PRINZIDE,ZESTORETIC) 20-25 MG per tablet Take 1 tablet by mouth daily.  90 tablet  3  . meloxicam (MOBIC) 15 MG tablet One tab PO qAM with breakfast for 2 weeks, then daily prn pain.  90 tablet  1  . metoprolol tartrate (LOPRESSOR) 50 MG tablet Take 1 tablet (50 mg total) by mouth 2 (two) times daily.  180 tablet  3  . nitroGLYCERIN (NITROSTAT) 0.4 MG SL tablet Place 1 tablet (0.4 mg total) under the tongue every 5 (five) minutes as needed for chest pain.  100 tablet  3  . traMADol (ULTRAM) 50 MG tablet Take 1 tablet (50 mg total) by mouth every 8 (eight) hours as needed for pain.  50 tablet  2   No current facility-administered  medications for this visit.    No Known Allergies  Past Medical History  Diagnosis Date  . Cataract   . Macular degeneration, left eye   . Hypertension     Past Surgical History  Procedure Laterality Date  . Knee surgery      History   Social History  . Marital Status: Married    Spouse Name: N/A    Number of Children: 5  . Years of Education: N/A   Occupational History  . Not on file.   Social History Main Topics  . Smoking status: Former Smoker  . Smokeless tobacco: Not on file  . Alcohol Use: No  . Drug Use: No  . Sexually Active: Yes    Birth Control/ Protection: None   Other Topics Concern  . Not on file   Social History Narrative  . No narrative on file    Family History  Problem Relation Age of Onset  . Cancer Daughter     ROS: no fevers or chills, productive cough, hemoptysis, dysphasia, odynophagia, melena, hematochezia, dysuria, hematuria, rash, seizure activity, orthopnea, PND, pedal edema, claudication. Remaining systems are negative.  Physical Exam:   Blood pressure 120/70, pulse 94, weight 295 lb (133.811 kg).  General:  Well developed/obese in NAD Skin warm/dry Patient not depressed No peripheral clubbing Back-normal HEENT-normal/normal eyelids Neck   supple/normal carotid upstroke bilaterally; no bruits; no JVD; no thyromegaly chest - CTA/ normal expansion CV - RRR/normal S1 and S2; no rubs or gallops;  PMI nondisplaced, 1/6 systolic ejection murmur Abdomen -NT/ND, no HSM, no mass, + bowel sounds, no bruit 2+ femoral pulses, no bruits Ext-no edema, chords, 2+ DP Neuro-grossly nonfocal  ECG sinus rhythm at a rate of 84. Left anterior fascicular block. RV conduction delay. No ST changes.   

## 2012-06-08 NOTE — Patient Instructions (Addendum)
Your physician recommends that you schedule a follow-up appointment in: 6 WEEKS WITH DR Jens Som IN [place]  Your physician recommends that you HAVE LAB WORK TODAY  A chest x-ray takes a picture of the organs and structures inside the chest, including the heart, lungs, and blood vessels. This test can show several things, including, whether the heart is enlarges; whether fluid is building up in the lungs; and whether pacemaker / defibrillator leads are still in place.

## 2012-06-08 NOTE — Assessment & Plan Note (Signed)
Continue present blood pressure medications. 

## 2012-06-09 ENCOUNTER — Telehealth: Payer: Self-pay | Admitting: Cardiology

## 2012-06-09 ENCOUNTER — Other Ambulatory Visit: Payer: Self-pay | Admitting: Cardiology

## 2012-06-09 DIAGNOSIS — R072 Precordial pain: Secondary | ICD-10-CM

## 2012-06-09 LAB — BASIC METABOLIC PANEL WITH GFR
CO2: 24 mEq/L (ref 19–32)
Calcium: 9.5 mg/dL (ref 8.4–10.5)
Creat: 1.31 mg/dL (ref 0.50–1.35)
GFR, Est Non African American: 53 mL/min — ABNORMAL LOW

## 2012-06-09 NOTE — Telephone Encounter (Signed)
Spoke with pt, aware of lab results. Verbal understanding not to take lisinopril HCT today or tomorrow to prepare for cath. He will drink plenty of fluids

## 2012-06-09 NOTE — Telephone Encounter (Signed)
New problem ° ° °Pt returning your call. °

## 2012-06-10 ENCOUNTER — Encounter (HOSPITAL_BASED_OUTPATIENT_CLINIC_OR_DEPARTMENT_OTHER): Payer: Self-pay

## 2012-06-10 ENCOUNTER — Encounter (HOSPITAL_BASED_OUTPATIENT_CLINIC_OR_DEPARTMENT_OTHER)
Admission: RE | Disposition: A | Discharge status: Home / Self Care | Payer: Self-pay | Source: Ambulatory Visit | Attending: Cardiovascular Disease

## 2012-06-10 ENCOUNTER — Inpatient Hospital Stay (HOSPITAL_BASED_OUTPATIENT_CLINIC_OR_DEPARTMENT_OTHER)
Admission: RE | Admit: 2012-06-10 | Discharge: 2012-06-10 | Disposition: A | Discharge status: Home / Self Care | Payer: Medicare PPO | Source: Ambulatory Visit | Attending: Cardiovascular Disease | Admitting: Cardiovascular Disease

## 2012-06-10 DIAGNOSIS — R079 Chest pain, unspecified: Secondary | ICD-10-CM | POA: Insufficient documentation

## 2012-06-10 DIAGNOSIS — R072 Precordial pain: Secondary | ICD-10-CM

## 2012-06-10 DIAGNOSIS — Z79899 Other long term (current) drug therapy: Secondary | ICD-10-CM | POA: Insufficient documentation

## 2012-06-10 DIAGNOSIS — I4460 Unspecified fascicular block: Secondary | ICD-10-CM | POA: Insufficient documentation

## 2012-06-10 HISTORY — PX: CARDIAC CATHETERIZATION: SHX172

## 2012-06-10 SURGERY — JV LEFT HEART CATHETERIZATION WITH CORONARY ANGIOGRAM

## 2012-06-10 MED ORDER — SODIUM CHLORIDE 0.45 % IV SOLN: INTRAVENOUS | Status: AC

## 2012-06-10 MED ORDER — ONDANSETRON HCL 4 MG/2ML IJ SOLN: 4.0000 mg | Freq: Four times a day (QID) | INTRAMUSCULAR | Status: DC | PRN

## 2012-06-10 MED ORDER — SODIUM CHLORIDE 0.9 % IJ SOLN: 3.0000 mL | INTRAMUSCULAR | Status: DC | PRN

## 2012-06-10 MED ORDER — SODIUM CHLORIDE 0.9 % IV SOLN
1.0000 mL/kg/h | INTRAVENOUS | Status: DC
  Administered 2012-06-10: 1 mL/kg/h via INTRAVENOUS

## 2012-06-10 MED ORDER — ACETAMINOPHEN 325 MG PO TABS: 650.0000 mg | ORAL_TABLET | ORAL | Status: DC | PRN

## 2012-06-10 MED ORDER — DIAZEPAM 2 MG PO TABS
2.0000 mg | ORAL_TABLET | ORAL | Status: AC
  Administered 2012-06-10: 2 mg via ORAL

## 2012-06-10 MED ORDER — OXYCODONE-ACETAMINOPHEN 5-325 MG PO TABS: 1.0000 | ORAL_TABLET | ORAL | Status: DC | PRN

## 2012-06-10 MED ORDER — DIAZEPAM 2 MG PO TABS: 2.0000 mg | ORAL_TABLET | Freq: Two times a day (BID) | ORAL | Status: DC | PRN

## 2012-06-10 MED ORDER — SODIUM CHLORIDE 0.9 % IJ SOLN: 3.0000 mL | Freq: Two times a day (BID) | INTRAMUSCULAR | Status: DC

## 2012-06-10 MED ORDER — ASPIRIN EC 325 MG PO TBEC: 325.0000 mg | DELAYED_RELEASE_TABLET | Freq: Every day | ORAL | Status: DC

## 2012-06-10 MED ORDER — SODIUM CHLORIDE 0.9 % IV SOLN: 250.0000 mL | INTRAVENOUS | Status: DC | PRN

## 2012-06-10 MED ORDER — ASPIRIN 81 MG PO CHEW
324.0000 mg | CHEWABLE_TABLET | ORAL | Status: AC
  Administered 2012-06-10: 324 mg via ORAL

## 2012-06-10 NOTE — CV Procedure (Signed)
      Catheterization   Indication:  Chest Pain  Procedure: After informed consent and clinical "time out" the right groin was prepped and draped in a sterile fashion.  A 5Fr sheath was placed in the right femoral artery using seldinger technique and local lidocaine.  Standard JL4, JR4 and angled pigtail catheters were used to engage the coronary arteries.  Coronary arteries were visualized in orthogonal views using caudal and cranial angulation.  RAO ventriculography was done using 24 * cc of contrast.    Medications:   Versed: 2 mg's  Fentanyl: 0 ug's  Coronary Arteries: Right dominant with no anomalies  LM: Normal  LAD: Normal    D1: nornal  D2 nornal  D3: normal  Circumflex: nornal   OM1: nornal  OM2: normal  RCA: normal   PDA: normal  PLA: normal  Ventriculography: EF: 60 %, no RWMA;s  Hemodynamics:  Aortic Pressure: 125 69  mmHg  LV Pressure: 125 14  mmHg  Impression:  No signficant CAD  F/U primary  [name] 06/10/2012 9:54 AM

## 2012-06-10 NOTE — Progress Notes (Signed)
Bedrest begins @ 1015, tegaderm dressing applied to right groin site, site level 0.

## 2012-06-10 NOTE — Interval H&P Note (Signed)
History and Physical Interval Note:  06/10/2012 9:26 AM  Steven Patton  has presented today for surgery, with the diagnosis of cp  The various methods of treatment have been discussed with the patient and family. After consideration of risks, benefits and other options for treatment, the patient has consented to  Procedure(s): JV LEFT HEART CATHETERIZATION WITH CORONARY ANGIOGRAM (N/A) as a surgical intervention .  The patient's history has been reviewed, patient examined, no change in status, stable for surgery.  I have reviewed the patient's chart and labs.  Questions were answered to the patient's satisfaction.     [name]

## 2012-06-10 NOTE — Progress Notes (Signed)
Allen's test negative on right hand.  Performed twice, both negative.

## 2012-06-10 NOTE — H&P (View-Only) (Signed)
HPI: 77 year old male with no prior cardiac history for evaluation of chest pain. The patient states he has had 4-5 episodes of chest pain in the past 3 years. He had an episode Easter that awoke him from sleep. His most recent episode occurred approximately one week ago while riding in his car. The pain is epigastric in location and described as a stabbing pain. It radiates to his neck and he feels pain in his teeth described as a toothache. No associated symptoms. The pain is not pleuritic, positional or related to food. He had nitroglycerin at his primary care physician had given him at the time of his most recent event. He took 3 with eventual resolution of his pain. He has had no pain in the past one week. He denies exertional chest pain, dyspnea on exertion, pedal edema or syncope. No melena, hematochezia or acholic stools.  Current Outpatient Prescriptions  Medication Sig Dispense Refill  . AMBULATORY NON FORMULARY MEDICATION [employer] CPAP Machine, needs humidifier to fit CPAP machine.  1 each  0  . amLODipine (NORVASC) 10 MG tablet Take 1 tablet (10 mg total) by mouth daily.  90 tablet  3  . aspirin EC 81 MG tablet Take 1 tablet (81 mg total) by mouth daily.  90 tablet  3  . gabapentin (NEURONTIN) 300 MG capsule 4 tabs PO TID.      Marland Kitchen lisinopril-hydrochlorothiazide (PRINZIDE,ZESTORETIC) 20-25 MG per tablet Take 1 tablet by mouth daily.  90 tablet  3  . meloxicam (MOBIC) 15 MG tablet One tab PO qAM with breakfast for 2 weeks, then daily prn pain.  90 tablet  1  . metoprolol tartrate (LOPRESSOR) 50 MG tablet Take 1 tablet (50 mg total) by mouth 2 (two) times daily.  180 tablet  3  . nitroGLYCERIN (NITROSTAT) 0.4 MG SL tablet Place 1 tablet (0.4 mg total) under the tongue every 5 (five) minutes as needed for chest pain.  100 tablet  3  . traMADol (ULTRAM) 50 MG tablet Take 1 tablet (50 mg total) by mouth every 8 (eight) hours as needed for pain.  50 tablet  2   No current facility-administered  medications for this visit.    No Known Allergies  Past Medical History  Diagnosis Date  . Cataract   . Macular degeneration, left eye   . Hypertension     Past Surgical History  Procedure Laterality Date  . Knee surgery      History   Social History  . Marital Status: Married    Spouse Name: N/A    Number of Children: 5  . Years of Education: N/A   Occupational History  . Not on file.   Social History Main Topics  . Smoking status: Former Games developer  . Smokeless tobacco: Not on file  . Alcohol Use: No  . Drug Use: No  . Sexually Active: Yes    Birth Control/ Protection: None   Other Topics Concern  . Not on file   Social History Narrative  . No narrative on file    Family History  Problem Relation Age of Onset  . Cancer Daughter     ROS: no fevers or chills, productive cough, hemoptysis, dysphasia, odynophagia, melena, hematochezia, dysuria, hematuria, rash, seizure activity, orthopnea, PND, pedal edema, claudication. Remaining systems are negative.  Physical Exam:   Blood pressure 120/70, pulse 94, weight 295 lb (133.811 kg).  General:  Well developed/obese in NAD Skin warm/dry Patient not depressed No peripheral clubbing Back-normal HEENT-normal/normal eyelids Neck  supple/normal carotid upstroke bilaterally; no bruits; no JVD; no thyromegaly chest - CTA/ normal expansion CV - RRR/normal S1 and S2; no rubs or gallops;  PMI nondisplaced, 1/6 systolic ejection murmur Abdomen -NT/ND, no HSM, no mass, + bowel sounds, no bruit 2+ femoral pulses, no bruits Ext-no edema, chords, 2+ DP Neuro-grossly nonfocal  ECG sinus rhythm at a rate of 84. Left anterior fascicular block. RV conduction delay. No ST changes.

## 2012-06-13 ENCOUNTER — Ambulatory Visit (INDEPENDENT_AMBULATORY_CARE_PROVIDER_SITE_OTHER): Payer: Medicare PPO | Admitting: Sports Medicine

## 2012-06-13 VITALS — BP 124/75 | HR 64

## 2012-06-13 DIAGNOSIS — M1710 Unilateral primary osteoarthritis, unspecified knee: Secondary | ICD-10-CM

## 2012-06-13 DIAGNOSIS — M1612 Unilateral primary osteoarthritis, left hip: Secondary | ICD-10-CM

## 2012-06-13 DIAGNOSIS — M179 Osteoarthritis of knee, unspecified: Secondary | ICD-10-CM

## 2012-06-13 DIAGNOSIS — IMO0002 Reserved for concepts with insufficient information to code with codable children: Secondary | ICD-10-CM

## 2012-06-13 DIAGNOSIS — R079 Chest pain, unspecified: Secondary | ICD-10-CM

## 2012-06-13 NOTE — Assessment & Plan Note (Signed)
Continues to do well after second femoroacetabular joint injection last week.

## 2012-06-13 NOTE — Assessment & Plan Note (Signed)
Supartz injection #3 of 5 given today into the left knee. Return in one week for #4.

## 2012-06-13 NOTE — Progress Notes (Signed)
  Subjective:    CC: Followup  HPI: Left knee osteoarthritis: Here for Supartz injection #3 of 5.  Has not yet noted any benefit.  Left hip osteoarthritis: Continues to do very well after second femoroacetabular joint injection last week.  Chest pain: Recent cardiac catheterization showed clean coronaries, no intervention was performed per patient.  Past medical history, Surgical history, Family history not pertinant except as noted below, Social history, Allergies, and medications have been entered into the medical record, reviewed, and no changes needed.  Review of Systems: No fevers, chills, night sweats, weight loss, chest pain, or shortness of breath.   Objective:    General: Well Developed, well nourished, and in no acute distress.  Neuro: Alert and oriented x3, extra-ocular muscles intact, sensation grossly intact.  HEENT: Normocephalic, atraumatic, pupils equal round reactive to light, neck supple, no masses, no lymphadenopathy, thyroid nonpalpable.  Skin: Warm and dry, no rashes. Cardiac: Regular rate and rhythm, no murmurs rubs or gallops, no lower extremity edema.  Respiratory: Clear to auscultation bilaterally. Not using accessory muscles, speaking in full sentences.  Procedure: Real-time Ultrasound Guided Injection of left knee  Device: GE Logiq E  Ultrasound guided injection is preferred based studies that show increased duration, increased effect, greater accuracy, decreased procedural pain, increased response rate, and decreased cost with ultrasound guided versus blind injection.  Verbal informed consent obtained.  Time-out conducted.  Noted no overlying erythema, induration, or other signs of local infection.  Skin prepped in a sterile fashion.  Local anesthesia: Topical Ethyl chloride.  With sterile technique and under real time ultrasound guidance: 25 mg/2.5 mL of Supartz (sodium hyaluronate) in a prefilled syringe was injected easily into the knee through a 22-gauge  needle.  Completed without difficulty  Pain immediately resolved suggesting accurate placement of the medication.  Advised to call if fevers/chills, erythema, induration, drainage, or persistent bleeding.  Images permanently stored and available for review in the ultrasound unit.  Impression: Technically successful ultrasound guided injection.  Impression and Recommendations:

## 2012-06-13 NOTE — Assessment & Plan Note (Signed)
Catheterization Jun 10, 2012 was negative, clean coronaries.

## 2012-06-20 ENCOUNTER — Ambulatory Visit (INDEPENDENT_AMBULATORY_CARE_PROVIDER_SITE_OTHER): Payer: Medicare PPO | Admitting: Sports Medicine

## 2012-06-20 ENCOUNTER — Encounter: Payer: Self-pay | Admitting: Sports Medicine

## 2012-06-20 ENCOUNTER — Ambulatory Visit: Payer: Medicare PPO | Admitting: Family Medicine

## 2012-06-20 VITALS — BP 129/75 | HR 59

## 2012-06-20 DIAGNOSIS — M169 Osteoarthritis of hip, unspecified: Secondary | ICD-10-CM

## 2012-06-20 DIAGNOSIS — M1710 Unilateral primary osteoarthritis, unspecified knee: Secondary | ICD-10-CM

## 2012-06-20 DIAGNOSIS — IMO0002 Reserved for concepts with insufficient information to code with codable children: Secondary | ICD-10-CM

## 2012-06-20 DIAGNOSIS — M1612 Unilateral primary osteoarthritis, left hip: Secondary | ICD-10-CM

## 2012-06-20 DIAGNOSIS — E669 Obesity, unspecified: Secondary | ICD-10-CM

## 2012-06-20 DIAGNOSIS — M1610 Unilateral primary osteoarthritis, unspecified hip: Secondary | ICD-10-CM

## 2012-06-20 DIAGNOSIS — M179 Osteoarthritis of knee, unspecified: Secondary | ICD-10-CM

## 2012-06-20 MED ORDER — PHENTERMINE HCL 37.5 MG PO CAPS: 37.5000 mg | ORAL_CAPSULE | ORAL | Status: DC

## 2012-06-20 NOTE — Progress Notes (Signed)
  Subjective:    CC: Followup  HPI: Left hip osteoarthritis: Stable. Continues to do well months after injection.  Left knee osteoarthritis: Starting to have some improvement after 3 Supartz injections, here for #4.   Obesity:  Stable. Starting phentermine now the he has had a catheterization, and his coronaries are clean.  Past medical history, Surgical history, Family history not pertinant except as noted below, Social history, Allergies, and medications have been entered into the medical record, reviewed, and no changes needed.   Review of Systems: No fevers, chills, night sweats, weight loss, chest pain, or shortness of breath.   Objective:    General: Well Developed, well nourished, and in no acute distress.  Neuro: Alert and oriented x3, extra-ocular muscles intact, sensation grossly intact.  HEENT: Normocephalic, atraumatic, pupils equal round reactive to light, neck supple, no masses, no lymphadenopathy, thyroid nonpalpable.  Skin: Warm and dry, no rashes. Cardiac: Regular rate and rhythm, no murmurs rubs or gallops, no lower extremity edema.  Respiratory: Clear to auscultation bilaterally. Not using accessory muscles, speaking in full sentences.  Procedure: Real-time Ultrasound Guided Injection of left knee  Device: GE Logiq E  Ultrasound guided injection is preferred based studies that show increased duration, increased effect, greater accuracy, decreased procedural pain, increased response rate, and decreased cost with ultrasound guided versus blind injection.  Verbal informed consent obtained.  Time-out conducted.  Noted no overlying erythema, induration, or other signs of local infection.  Skin prepped in a sterile fashion.  Local anesthesia: Topical Ethyl chloride.  With sterile technique and under real time ultrasound guidance: 25 mg/2.5 mL of Supartz (sodium hyaluronate) in a prefilled syringe was injected easily into the knee through a 22-gauge needle.  Completed  without difficulty  Pain immediately resolved suggesting accurate placement of the medication.  Advised to call if fevers/chills, erythema, induration, drainage, or persistent bleeding.  Images permanently stored and available for review in the ultrasound unit.  Impression: Technically successful ultrasound guided injection.  Impression and Recommendations:

## 2012-06-20 NOTE — Assessment & Plan Note (Signed)
Continues to do well 

## 2012-06-20 NOTE — Assessment & Plan Note (Signed)
Phentermine. Nutritionist visit.

## 2012-06-20 NOTE — Assessment & Plan Note (Signed)
Supartz injection #4 of 5 in the left knee. Return in one week for #5.

## 2012-06-22 ENCOUNTER — Ambulatory Visit: Payer: Medicare PPO | Admitting: Cardiology

## 2012-06-22 ENCOUNTER — Encounter: Payer: Medicare PPO | Attending: Sports Medicine | Admitting: *Deleted

## 2012-06-22 VITALS — Ht 69.0 in | Wt 296.5 lb

## 2012-06-22 DIAGNOSIS — M1710 Unilateral primary osteoarthritis, unspecified knee: Secondary | ICD-10-CM | POA: Insufficient documentation

## 2012-06-22 DIAGNOSIS — IMO0002 Reserved for concepts with insufficient information to code with codable children: Secondary | ICD-10-CM | POA: Insufficient documentation

## 2012-06-22 DIAGNOSIS — Z713 Dietary counseling and surveillance: Secondary | ICD-10-CM | POA: Insufficient documentation

## 2012-06-22 DIAGNOSIS — E669 Obesity, unspecified: Secondary | ICD-10-CM

## 2012-06-22 NOTE — Progress Notes (Signed)
Medical Nutrition Therapy:  Appt start time: 1445 end time:  1545.  Assessment:  Primary concern today: Obesity. Patient with a history of hip and knee osteoarthritis, visiting today for weight management counseling. Patient reports that he has a new prescription for phentermine to aid with weight loss. He was previously on phentermine 2-3 years ago with 60 pounds weight loss, but has since regained the weight. He does report about a 10 pound weight loss in the last month due to eating out less. He reports drinking 2-3 liters of lemonade (packets), but is unsure if it is sugar sweetened or light. He does eat out several days a week with his wife, choosing mainly fried meats. His portion size is usually small-moderate. His goal is to lose 50-60 pounds.   WEIGHT: 296.5 pounds BMI: 43.9  MEDICATIONS: Phentermine, amlodipine, gabapentin, lisinopril-HCTZ, metoprolol   DIETARY INTAKE:   Usual eating pattern includes 3 meals and 1-2 snacks per day.  24-hr recall:  B ( AM): Poptart, lemonade  Snk ( AM): None  L ( PM): 6 inch Subway sub (club, vegetables, no cheese or condiments OR frozen dinner OR Progressive soup, lemonade, coffee (sweet and low, cream) Snk ( PM): None D ( PM): Same as lunch, [place] (shrimp and rice), Lin[employer] (Chinese buffet, fried chicken), lemonade or sweet tea Snk ( PM): Cheese crackers Beverages: lemonade, sweet tea, coffee   Usual physical activity: Walking on treadmill, daily let exercises for knee 3 times daily (resistance band)  Estimated energy needs: 2000 calories 250 g carbohydrates 125 g protein 56 g fat  Progress Towards Goal(s):  In progress.   Nutritional Diagnosis:  [place]-3.3 Overweight/obesity As related to excessive energy intake and physical inactivity.  As evidenced by BMI 43.9.    Intervention:  Nutrition counsling. We discussed strategies for weight loss, including portion control, limiting fried foods, increasing fruits and vegetables, and exercise.    Goals:  1. 1 pound weight loss per week. Goal weight 245-250 pounds. 2. Check calories in lemonade, if sugar sweetened, switch to diet or light.  3. Limit intake of fried food, choose baked or grilled more frequently.  4. Increase intake of fruits and vegetables.   Handouts given during visit include:  General healthful eating handout  Monitoring/Evaluation:  Dietary intake, exercise, and body weight prn.

## 2012-06-28 ENCOUNTER — Ambulatory Visit: Payer: Medicare PPO | Admitting: Sports Medicine

## 2012-06-30 ENCOUNTER — Encounter: Payer: Self-pay | Admitting: Sports Medicine

## 2012-06-30 ENCOUNTER — Ambulatory Visit (INDEPENDENT_AMBULATORY_CARE_PROVIDER_SITE_OTHER): Payer: Medicare PPO | Admitting: Sports Medicine

## 2012-06-30 VITALS — BP 133/79 | HR 75 | Wt 295.0 lb

## 2012-06-30 DIAGNOSIS — IMO0002 Reserved for concepts with insufficient information to code with codable children: Secondary | ICD-10-CM

## 2012-06-30 DIAGNOSIS — M1710 Unilateral primary osteoarthritis, unspecified knee: Secondary | ICD-10-CM

## 2012-06-30 DIAGNOSIS — M179 Osteoarthritis of knee, unspecified: Secondary | ICD-10-CM

## 2012-06-30 NOTE — Progress Notes (Signed)
   Procedure: Real-time Ultrasound Guided Injection of left knee  Device: GE Logiq E  Ultrasound guided injection is preferred based studies that show increased duration, increased effect, greater accuracy, decreased procedural pain, increased response rate, and decreased cost with ultrasound guided versus blind injection.  Verbal informed consent obtained.  Time-out conducted.  Noted no overlying erythema, induration, or other signs of local infection.  Skin prepped in a sterile fashion.  Local anesthesia: Topical Ethyl chloride.  With sterile technique and under real time ultrasound guidance: 25 mg/2.5 mL of Supartz (sodium hyaluronate) in a prefilled syringe was injected easily into the knee through a 22-gauge needle.  Completed without difficulty  Pain immediately resolved suggesting accurate placement of the medication.  Advised to call if fevers/chills, erythema, induration, drainage, or persistent bleeding.  Images permanently stored and available for review in the ultrasound unit.  Impression: Technically successful ultrasound guided injection.

## 2012-06-30 NOTE — Assessment & Plan Note (Signed)
Pain is 80% better since starting Supartz series. Injection #5 of 5 done today. Return in one month to see how things are going.

## 2012-07-18 ENCOUNTER — Encounter: Payer: Self-pay | Admitting: Sports Medicine

## 2012-07-18 ENCOUNTER — Ambulatory Visit: Payer: Medicare PPO | Admitting: Sports Medicine

## 2012-07-18 ENCOUNTER — Ambulatory Visit (INDEPENDENT_AMBULATORY_CARE_PROVIDER_SITE_OTHER): Payer: Medicare PPO | Admitting: Sports Medicine

## 2012-07-18 VITALS — BP 122/79 | HR 77 | Wt 288.0 lb

## 2012-07-18 DIAGNOSIS — M179 Osteoarthritis of knee, unspecified: Secondary | ICD-10-CM

## 2012-07-18 DIAGNOSIS — E669 Obesity, unspecified: Secondary | ICD-10-CM

## 2012-07-18 DIAGNOSIS — M1612 Unilateral primary osteoarthritis, left hip: Secondary | ICD-10-CM

## 2012-07-18 DIAGNOSIS — M1710 Unilateral primary osteoarthritis, unspecified knee: Secondary | ICD-10-CM

## 2012-07-18 DIAGNOSIS — M48062 Spinal stenosis, lumbar region with neurogenic claudication: Secondary | ICD-10-CM

## 2012-07-18 DIAGNOSIS — IMO0002 Reserved for concepts with insufficient information to code with codable children: Secondary | ICD-10-CM

## 2012-07-18 MED ORDER — HYDROCODONE-ACETAMINOPHEN 5-325 MG PO TABS: 1.0000 | ORAL_TABLET | Freq: Three times a day (TID) | ORAL | Status: DC | PRN

## 2012-07-18 MED ORDER — PHENTERMINE HCL 37.5 MG PO CAPS: 37.5000 mg | ORAL_CAPSULE | ORAL | Status: DC

## 2012-07-18 NOTE — Assessment & Plan Note (Signed)
Status post multiple steroid injections as well as Supartz series. Reinjection today. Referral to orthopedics for consideration of total knee arthroplasty.

## 2012-07-18 NOTE — Assessment & Plan Note (Signed)
Pain has returned. We will obtain an MRI for interventional injection planning.

## 2012-07-18 NOTE — Assessment & Plan Note (Signed)
Refilling. Return in one month to recheck weight.

## 2012-07-18 NOTE — Progress Notes (Signed)
  Subjective:    CC: Followup  HPI: Left knee osteoarthritis: Has finished several injections, and just finished to 5 shots 2 part series, continues to have pain and wonders if we can reinject today.  Left hip osteoarthritis: Pain continues to be resolved after 2 intra-articular injections under ultrasound guidance.  Low back pain: Has symptoms highly classic for spinal stenosis with neurogenic claudication, we've not treated this in the past as his symptoms had resolved after conservative measures, unfortunately they have returned today.  Obesity: Started phentermine after clean coronary catheterization. He has overall lost 9 pounds in the last one month.  Past medical history, Surgical history, Family history not pertinant except as noted below, Social history, Allergies, and medications have been entered into the medical record, reviewed, and no changes needed.   Review of Systems: No fevers, chills, night sweats, weight loss, chest pain, or shortness of breath.   Objective:    General: Well Developed, well nourished, and in no acute distress.  Neuro: Alert and oriented x3, extra-ocular muscles intact, sensation grossly intact.  HEENT: Normocephalic, atraumatic, pupils equal round reactive to light, neck supple, no masses, no lymphadenopathy, thyroid nonpalpable.  Skin: Warm and dry, no rashes. Cardiac: Regular rate and rhythm, no murmurs rubs or gallops, no lower extremity edema.  Respiratory: Clear to auscultation bilaterally. Not using accessory muscles, speaking in full sentences.  Procedure: Real-time Ultrasound Guided Injection of left knee Device: GE Logiq E  Verbal informed consent obtained.  Time-out conducted.  Noted no overlying erythema, induration, or other signs of local infection.  Skin prepped in a sterile fashion.  Local anesthesia: Topical Ethyl chloride.  With sterile technique and under real time ultrasound guidance:  2 cc Kenalog 40, 4 cc lidocaine injected  easily into the suprapatellar recess. Completed without difficulty  Pain immediately resolved suggesting accurate placement of the medication.  Advised to call if fevers/chills, erythema, induration, drainage, or persistent bleeding.  Images permanently stored and available for review in the ultrasound unit.  Impression: Technically successful ultrasound guided injection. Impression and Recommendations:

## 2012-07-18 NOTE — Assessment & Plan Note (Signed)
Pain continues to be resolved after injection.

## 2012-07-19 ENCOUNTER — Telehealth: Payer: Self-pay | Admitting: *Deleted

## 2012-07-19 ENCOUNTER — Ambulatory Visit (INDEPENDENT_AMBULATORY_CARE_PROVIDER_SITE_OTHER): Payer: Medicare PPO

## 2012-07-19 DIAGNOSIS — M519 Unspecified thoracic, thoracolumbar and lumbosacral intervertebral disc disorder: Secondary | ICD-10-CM

## 2012-07-19 DIAGNOSIS — M48061 Spinal stenosis, lumbar region without neurogenic claudication: Secondary | ICD-10-CM

## 2012-07-19 DIAGNOSIS — M5137 Other intervertebral disc degeneration, lumbosacral region: Secondary | ICD-10-CM

## 2012-07-19 DIAGNOSIS — M48062 Spinal stenosis, lumbar region with neurogenic claudication: Secondary | ICD-10-CM

## 2012-07-19 NOTE — Telephone Encounter (Signed)
Prior auth obtained for MRI L-spine WO.  Number is [id number].  Imaging dept notified.

## 2012-07-27 ENCOUNTER — Ambulatory Visit (INDEPENDENT_AMBULATORY_CARE_PROVIDER_SITE_OTHER): Payer: Medicare PPO | Admitting: Cardiology

## 2012-07-27 ENCOUNTER — Encounter: Payer: Self-pay | Admitting: Cardiology

## 2012-07-27 VITALS — BP 128/70 | HR 79 | Wt 288.0 lb

## 2012-07-27 DIAGNOSIS — D72829 Elevated white blood cell count, unspecified: Secondary | ICD-10-CM

## 2012-07-27 DIAGNOSIS — R079 Chest pain, unspecified: Secondary | ICD-10-CM

## 2012-07-27 DIAGNOSIS — Z01810 Encounter for preprocedural cardiovascular examination: Secondary | ICD-10-CM | POA: Insufficient documentation

## 2012-07-27 DIAGNOSIS — I10 Essential (primary) hypertension: Secondary | ICD-10-CM

## 2012-07-27 NOTE — Patient Instructions (Addendum)
Your physician recommends that you schedule a follow-up appointment in: AS NEEDED  

## 2012-07-27 NOTE — Assessment & Plan Note (Signed)
Blood pressure controlled. Continue present medications. 

## 2012-07-27 NOTE — Assessment & Plan Note (Signed)
No further symptoms. Catheterization revealed normal coronary arteries. No further workup at this time.

## 2012-07-27 NOTE — Assessment & Plan Note (Signed)
Noted on precatheterization laboratories. Repeat CBC.

## 2012-07-27 NOTE — Assessment & Plan Note (Signed)
Patient is scheduled for knee replacement. His catheterization showed normal coronaries. He may proceed without further cardiac workup.

## 2012-07-27 NOTE — Progress Notes (Signed)
HPI: pleasant male for followup of chest pain. I initially saw him in May of 2014 with the symptoms and felt they were concerning. We scheduled a cardiac catheterization which was performed in May of 2014 and showed an ejection fraction of 60% and normal coronary arteries. Note white blood cell count was mildly elevated on precatheterization laboratories. Since his cath., the patient denies any dyspnea on exertion, orthopnea, PND, pedal edema, palpitations, syncope or chest pain.    Current Outpatient Prescriptions  Medication Sig Dispense Refill  . AMBULATORY NON FORMULARY MEDICATION [employer] CPAP Machine, needs humidifier to fit CPAP machine.  1 each  0  . amLODipine (NORVASC) 10 MG tablet Take 1 tablet (10 mg total) by mouth daily.  90 tablet  3  . aspirin EC 81 MG tablet Take 1 tablet (81 mg total) by mouth daily.  90 tablet  3  . gabapentin (NEURONTIN) 300 MG capsule 4 tabs PO TID.      Marland Kitchen HYDROcodone-acetaminophen (NORCO/VICODIN) 5-325 MG per tablet Take 1 tablet by mouth every 8 (eight) hours as needed for pain.  40 tablet  0  . lisinopril-hydrochlorothiazide (PRINZIDE,ZESTORETIC) 20-25 MG per tablet Take 1 tablet by mouth daily.  90 tablet  3  . meloxicam (MOBIC) 15 MG tablet One tab PO qAM with breakfast for 2 weeks, then daily prn pain.  90 tablet  1  . metoprolol tartrate (LOPRESSOR) 50 MG tablet Take 1 tablet (50 mg total) by mouth 2 (two) times daily.  180 tablet  3  . nitroGLYCERIN (NITROSTAT) 0.4 MG SL tablet Place 1 tablet (0.4 mg total) under the tongue every 5 (five) minutes as needed for chest pain.  100 tablet  3  . phentermine 37.5 MG capsule Take 1 capsule (37.5 mg total) by mouth every morning.  30 capsule  0  . traMADol (ULTRAM) 50 MG tablet Take 1 tablet (50 mg total) by mouth every 8 (eight) hours as needed for pain.  50 tablet  2   No current facility-administered medications for this visit.     Past Medical History  Diagnosis Date  . Cataract   . Macular  degeneration, left eye   . Hypertension     Past Surgical History  Procedure Laterality Date  . Knee surgery      History   Social History  . Marital Status: Married    Spouse Name: N/A    Number of Children: 5  . Years of Education: N/A   Occupational History  . Not on file.   Social History Main Topics  . Smoking status: Former Games developer  . Smokeless tobacco: Not on file  . Alcohol Use: No  . Drug Use: No  . Sexually Active: Yes    Birth Control/ Protection: None   Other Topics Concern  . Not on file   Social History Narrative  . No narrative on file    ROS: no fevers or chills, productive cough, hemoptysis, dysphasia, odynophagia, melena, hematochezia, dysuria, hematuria, rash, seizure activity, orthopnea, PND, pedal edema, claudication. Remaining systems are negative.  Physical Exam: Well-developed obese in no acute distress.  Skin is warm and dry.  HEENT is normal.  Neck is supple.  Chest is clear to auscultation with normal expansion.  Cardiovascular exam is regular rate and rhythm.  Abdominal exam nontender or distended. No masses palpated. Right groin with no hematoma and no bruit. Extremities show no edema. neuro grossly intact  ECG sinus rhythm at a rate of 79. Left axis deviation.  RV conduction delay. No ST changes.

## 2012-07-28 LAB — CBC
Hemoglobin: 15.9 g/dL (ref 13.0–17.0)
RBC: 5.17 MIL/uL (ref 4.22–5.81)
WBC: 12.8 10*3/uL — ABNORMAL HIGH (ref 4.0–10.5)

## 2012-08-15 ENCOUNTER — Ambulatory Visit (INDEPENDENT_AMBULATORY_CARE_PROVIDER_SITE_OTHER): Payer: Medicare PPO | Admitting: Sports Medicine

## 2012-08-15 ENCOUNTER — Encounter: Payer: Self-pay | Admitting: Sports Medicine

## 2012-08-15 VITALS — BP 116/81 | HR 77 | Wt 281.0 lb

## 2012-08-15 DIAGNOSIS — E669 Obesity, unspecified: Secondary | ICD-10-CM

## 2012-08-15 DIAGNOSIS — D72829 Elevated white blood cell count, unspecified: Secondary | ICD-10-CM

## 2012-08-15 DIAGNOSIS — M1710 Unilateral primary osteoarthritis, unspecified knee: Secondary | ICD-10-CM

## 2012-08-15 DIAGNOSIS — M179 Osteoarthritis of knee, unspecified: Secondary | ICD-10-CM

## 2012-08-15 DIAGNOSIS — IMO0002 Reserved for concepts with insufficient information to code with codable children: Secondary | ICD-10-CM

## 2012-08-15 DIAGNOSIS — M48062 Spinal stenosis, lumbar region with neurogenic claudication: Secondary | ICD-10-CM

## 2012-08-15 MED ORDER — PHENTERMINE HCL 37.5 MG PO CAPS: 37.5000 mg | ORAL_CAPSULE | ORAL | Status: DC

## 2012-08-15 MED ORDER — AMBULATORY NON FORMULARY MEDICATION: Status: DC

## 2012-08-15 NOTE — Assessment & Plan Note (Signed)
Is getting set up for knee replacement with Dr. Luiz Blare, already has clearance from cardiology.

## 2012-08-15 NOTE — Assessment & Plan Note (Signed)
This is likely related to recent steroid injection. We will keep an eye on this.

## 2012-08-15 NOTE — Assessment & Plan Note (Signed)
Total weight loss of 14 pounds. Refilling phentermine. Return in one month for a weight check and refills.

## 2012-08-15 NOTE — Progress Notes (Signed)
  Subjective:    CC: Followup  HPI: Obesity: Good weight loss so far, needs a refill on the medications.  Knee osteoarthritis: getting set up for total knee arthroplasty with Dr. Luiz Blare.  Lumbar spinal stenosis: with neurogenic claudication, desires to avoid surgical treatment, painful to stand up straight, pain also causes cramping in both legs with ambulation, that her with leaning forward. Symptoms radiate predominately to the anterior left thigh.  Leukocytosis: Noted a couple of recent CBCs, he has had multiple steroid injections which could be contributory.  Past medical history, Surgical history, Family history not pertinant except as noted below, Social history, Allergies, and medications have been entered into the medical record, reviewed, and no changes needed.   Review of Systems: No fevers, chills, night sweats, weight loss, chest pain, or shortness of breath.   Objective:    General: Well Developed, well nourished, and in no acute distress.  Neuro: Alert and oriented x3, extra-ocular muscles intact, sensation grossly intact.  HEENT: Normocephalic, atraumatic, pupils equal round reactive to light, neck supple, no masses, no lymphadenopathy, thyroid nonpalpable.  Skin: Warm and dry, no rashes. Cardiac: Regular rate and rhythm, no murmurs rubs or gallops, no lower extremity edema.  Respiratory: Clear to auscultation bilaterally. Not using accessory muscles, speaking in full sentences. Impression and Recommendations:

## 2012-08-15 NOTE — Assessment & Plan Note (Signed)
This is fairly severe at the T11-T12 as well as the L4-L5 levels. He does desire to avoid surgery, we will try an epidural, as his symptoms are predominantly left and teary her thigh we will pursue initially a left-sided L4-L5 transforaminal epidural steroid injection. Next target will be left-sided T11-T12 injection. He does not have any signs of cauda equina syndrome or cord compression.

## 2012-08-17 ENCOUNTER — Other Ambulatory Visit: Payer: Self-pay | Admitting: Sports Medicine

## 2012-08-17 MED ORDER — HYDROCODONE-ACETAMINOPHEN 5-325 MG PO TABS: 1.0000 | ORAL_TABLET | Freq: Three times a day (TID) | ORAL | Status: DC | PRN

## 2012-08-29 ENCOUNTER — Ambulatory Visit
Admission: RE | Admit: 2012-08-29 | Discharge: 2012-08-29 | Disposition: A | Discharge status: Home / Self Care | Payer: Medicare PPO | Source: Ambulatory Visit | Attending: Sports Medicine | Admitting: Sports Medicine

## 2012-08-29 MED ORDER — IOHEXOL 180 MG/ML  SOLN
1.0000 mL | Freq: Once | INTRAMUSCULAR | Status: AC | PRN
  Administered 2012-08-29: 1 mL via EPIDURAL

## 2012-08-29 MED ORDER — METHYLPREDNISOLONE ACETATE 40 MG/ML INJ SUSP (RADIOLOG
120.0000 mg | Freq: Once | INTRAMUSCULAR | Status: AC
  Administered 2012-08-29: 120 mg via EPIDURAL

## 2012-09-02 ENCOUNTER — Encounter (HOSPITAL_COMMUNITY): Payer: Self-pay | Admitting: Pharmacy Technician

## 2012-09-05 ENCOUNTER — Encounter: Payer: Self-pay | Admitting: Sports Medicine

## 2012-09-05 ENCOUNTER — Ambulatory Visit (INDEPENDENT_AMBULATORY_CARE_PROVIDER_SITE_OTHER): Payer: Medicare PPO | Admitting: Sports Medicine

## 2012-09-05 VITALS — BP 102/70 | HR 124 | Wt 276.0 lb

## 2012-09-05 DIAGNOSIS — M48062 Spinal stenosis, lumbar region with neurogenic claudication: Secondary | ICD-10-CM

## 2012-09-05 DIAGNOSIS — E669 Obesity, unspecified: Secondary | ICD-10-CM

## 2012-09-05 MED ORDER — HYDROCODONE-ACETAMINOPHEN 10-325 MG PO TABS: 1.0000 | ORAL_TABLET | Freq: Three times a day (TID) | ORAL | Status: DC | PRN

## 2012-09-05 NOTE — Assessment & Plan Note (Signed)
Holding off on phentermine for now.

## 2012-09-05 NOTE — Progress Notes (Addendum)
  Subjective:    CC: Followup  HPI: Lumbar spinal stenosis with degenerative disc disease: Steven Patton has been through all conservative measures including physical therapy, oral steroids, NSAIDs, muscle relaxers, gabapentin. He does not note any improvement with gabapentin, hydrocodone is moderately effective. I just sent him for a left-sided L4-L5 transforaminal epidural steroid injection, he tells me he noted 70-90% improvement in his symptoms, for several days after the injection but unfortunately symptoms have returned. Symptoms are severe, persistent, stable.  Osteoarthritis: Is scheduled for total knee replacement. This will be with Dr. Luiz Blare.  Obesity: This patient is having significant pain, he was getting good weight loss from phentermine, but desires to hold off on it for now until we can get the knee and the back issues sorted out.  Past medical history, Surgical history, Family history not pertinant except as noted below, Social history, Allergies, and medications have been entered into the medical record, reviewed, and no changes needed.   Review of Systems: No fevers, chills, night sweats, weight loss, chest pain, or shortness of breath.   Objective:    General: Well Developed, well nourished, and in no acute distress.  Neuro: Alert and oriented x3, extra-ocular muscles intact, sensation grossly intact.  HEENT: Normocephalic, atraumatic, pupils equal round reactive to light, neck supple, no masses, no lymphadenopathy, thyroid nonpalpable.  Skin: Warm and dry, no rashes. Cardiac: Regular rate and rhythm, no murmurs rubs or gallops, no lower extremity edema.  Respiratory: Clear to auscultation bilaterally. Not using accessory muscles, speaking in full sentences.  Impression and Recommendations:

## 2012-09-05 NOTE — Assessment & Plan Note (Signed)
Good but temporary response to a left L4-5 transforaminal epidural. With a significant amount of pain today. Repeat L4-L5 left-sided transforaminal epidural steroid injection. Doubling pain medicine to 10/325. No response to gabapentin, discontinuing this.

## 2012-09-06 ENCOUNTER — Encounter (HOSPITAL_COMMUNITY): Payer: Self-pay

## 2012-09-07 ENCOUNTER — Encounter (HOSPITAL_COMMUNITY)
Admission: RE | Admit: 2012-09-07 | Discharge: 2012-09-07 | Disposition: A | Discharge status: Home / Self Care | Payer: Medicare PPO | Source: Ambulatory Visit | Attending: Orthopedic Surgery | Admitting: Orthopedic Surgery

## 2012-09-07 ENCOUNTER — Encounter (HOSPITAL_COMMUNITY): Payer: Self-pay

## 2012-09-07 DIAGNOSIS — Z01818 Encounter for other preprocedural examination: Secondary | ICD-10-CM | POA: Insufficient documentation

## 2012-09-07 DIAGNOSIS — Z01812 Encounter for preprocedural laboratory examination: Secondary | ICD-10-CM | POA: Insufficient documentation

## 2012-09-07 HISTORY — DX: Unspecified osteoarthritis, unspecified site: M19.90

## 2012-09-07 HISTORY — DX: Sleep apnea, unspecified: G47.30

## 2012-09-07 LAB — COMPREHENSIVE METABOLIC PANEL
ALT: 20 U/L (ref 0–53)
Alkaline Phosphatase: 54 U/L (ref 39–117)
CO2: 29 mEq/L (ref 19–32)
GFR calc Af Amer: 64 mL/min — ABNORMAL LOW (ref >90)
Glucose, Bld: 123 mg/dL — ABNORMAL HIGH (ref 70–99)
Potassium: 3.7 mEq/L (ref 3.5–5.1)
Sodium: 139 mEq/L (ref 135–145)
Total Protein: 7.6 g/dL (ref 6.0–8.3)

## 2012-09-07 LAB — URINALYSIS, ROUTINE W REFLEX MICROSCOPIC
Bilirubin Urine: NEGATIVE
Ketones, ur: NEGATIVE mg/dL
Leukocytes, UA: NEGATIVE
Nitrite: NEGATIVE
Protein, ur: NEGATIVE mg/dL
Urobilinogen, UA: 1 mg/dL (ref 0.0–1.0)

## 2012-09-07 LAB — TYPE AND SCREEN: Antibody Screen: NEGATIVE

## 2012-09-07 LAB — SURGICAL PCR SCREEN
MRSA, PCR: NEGATIVE
Staphylococcus aureus: NEGATIVE

## 2012-09-07 LAB — CBC
Hemoglobin: 16.8 g/dL (ref 13.0–17.0)
MCHC: 35.6 g/dL (ref 30.0–36.0)
RBC: 5.3 MIL/uL (ref 4.22–5.81)
WBC: 14.4 10*3/uL — ABNORMAL HIGH (ref 4.0–10.5)

## 2012-09-07 LAB — PROTIME-INR
INR: 0.93 (ref 0.00–1.49)
Prothrombin Time: 12.3 seconds (ref 11.6–15.2)

## 2012-09-07 NOTE — Pre-Procedure Instructions (Signed)
Steven Patton  09/07/2012   Your procedure is scheduled on: Friday, August 15th     Report to [facility] at 8:30 AM.  Call this number if you have problems the morning of surgery: [phone]   Remember:   Do not eat food or drink liquids after midnight Thursday.   Take these medicines the morning of surgery with A SIP OF WATER: Norvasc, Norco, Lopressor   Do not wear jewelry.  Do not wear lotions, powders, or colognes. You may NOT wear deodorant.             Men may shave face and neck.  Do not bring valuables to the hospital.  [facility] is not responsible for any belongings or valuables.  Contacts, dentures or bridgework may not be worn into surgery.   Leave suitcase in the car. After surgery it may be brought to your room.  For patients admitted to the hospital, checkout time is 11:00 AM the day of discharge.   Name and phone number of your driver:  Steven Patton --  SPOUSE   Special Instructions: Shower using CHG 2 nights before surgery and the night before surgery.  If you shower the day of surgery use CHG.  Use special wash - you have one bottle of CHG for all showers.  You should use approximately 1/3 of the bottle for each shower.   Please read over the following fact sheets that you were given: Pain Booklet, Coughing and Deep Breathing, Blood Transfusion Information, MRSA Information and Surgical Site Infection Prevention

## 2012-09-07 NOTE — Progress Notes (Signed)
I called the office to remind them that we do not have orders.  Pt also denies he has pacer or icd...........DA

## 2012-09-12 ENCOUNTER — Ambulatory Visit
Admission: RE | Admit: 2012-09-12 | Discharge: 2012-09-12 | Disposition: A | Discharge status: Home / Self Care | Payer: Medicare PPO | Source: Ambulatory Visit | Attending: Sports Medicine | Admitting: Sports Medicine

## 2012-09-12 MED ORDER — METHYLPREDNISOLONE ACETATE 40 MG/ML INJ SUSP (RADIOLOG
120.0000 mg | Freq: Once | INTRAMUSCULAR | Status: AC
  Administered 2012-09-12: 120 mg via EPIDURAL

## 2012-09-12 MED ORDER — IOHEXOL 180 MG/ML  SOLN
1.0000 mL | Freq: Once | INTRAMUSCULAR | Status: AC | PRN
  Administered 2012-09-12: 1 mL via EPIDURAL

## 2012-09-15 ENCOUNTER — Ambulatory Visit (INDEPENDENT_AMBULATORY_CARE_PROVIDER_SITE_OTHER): Payer: Medicare PPO | Admitting: Sports Medicine

## 2012-09-15 ENCOUNTER — Encounter: Payer: Self-pay | Admitting: Sports Medicine

## 2012-09-15 VITALS — BP 123/75 | HR 63 | Wt 272.0 lb

## 2012-09-15 DIAGNOSIS — M179 Osteoarthritis of knee, unspecified: Secondary | ICD-10-CM

## 2012-09-15 DIAGNOSIS — M48062 Spinal stenosis, lumbar region with neurogenic claudication: Secondary | ICD-10-CM

## 2012-09-15 DIAGNOSIS — M1710 Unilateral primary osteoarthritis, unspecified knee: Secondary | ICD-10-CM

## 2012-09-15 DIAGNOSIS — IMO0002 Reserved for concepts with insufficient information to code with codable children: Secondary | ICD-10-CM

## 2012-09-15 MED ORDER — AMBULATORY NON FORMULARY MEDICATION: Status: DC

## 2012-09-15 NOTE — Progress Notes (Signed)
  Subjective:    CC: Followup  HPI: Lumbar degenerative disease: Now status post 2 epidurals, 50% pain relief after his second epidural recently, the rest of his pain is controlled with hydrocodone tens. Symptoms are mild, persistent, stable. He does have radiation down his legs with the pain, but not much axial pain.  Knee osteoarthritis: scheduled for total knee arthroplasty tomorrow.  Past medical history, Surgical history, Family history not pertinant except as noted below, Social history, Allergies, and medications have been entered into the medical record, reviewed, and no changes needed.   Review of Systems: No fevers, chills, night sweats, weight loss, chest pain, or shortness of breath.   Objective:    General: Well Developed, well nourished, and in no acute distress.  Neuro: Alert and oriented x3, extra-ocular muscles intact, sensation grossly intact.  HEENT: Normocephalic, atraumatic, pupils equal round reactive to light, neck supple, no masses, no lymphadenopathy, thyroid nonpalpable.  Skin: Warm and dry, no rashes. Cardiac: Regular rate and rhythm, no murmurs rubs or gallops, no lower extremity edema.  Respiratory: Clear to auscultation bilaterally. Not using accessory muscles, speaking in full sentences. Impression and Recommendations:

## 2012-09-15 NOTE — Assessment & Plan Note (Signed)
Pain 50% better with left L4/L5 transforaminal injection. I do suspect he will proceed to surgical intervention but we will delay this until after is knee replacement is healed. Prescription written for lift chair.

## 2012-09-15 NOTE — Assessment & Plan Note (Signed)
TKA is scheduled for tomorrow.

## 2012-09-16 ENCOUNTER — Encounter (HOSPITAL_COMMUNITY): Payer: Self-pay | Admitting: Anesthesiology

## 2012-09-16 ENCOUNTER — Encounter (HOSPITAL_COMMUNITY)
Admission: RE | Disposition: A | Discharge status: Home / Self Care | Payer: Self-pay | Source: Ambulatory Visit | Attending: Orthopedic Surgery

## 2012-09-16 ENCOUNTER — Inpatient Hospital Stay (HOSPITAL_COMMUNITY)
Admission: RE | Admit: 2012-09-16 | Discharge: 2012-09-18 | DRG: 470 | Disposition: A | Discharge status: Home / Self Care | Payer: Medicare PPO | Source: Ambulatory Visit | Attending: Orthopedic Surgery | Admitting: Orthopedic Surgery

## 2012-09-16 ENCOUNTER — Inpatient Hospital Stay (HOSPITAL_COMMUNITY): Payer: Medicare PPO | Admitting: Anesthesiology

## 2012-09-16 ENCOUNTER — Encounter (HOSPITAL_COMMUNITY): Payer: Self-pay | Admitting: Certified Registered"

## 2012-09-16 DIAGNOSIS — M48062 Spinal stenosis, lumbar region with neurogenic claudication: Secondary | ICD-10-CM | POA: Diagnosis present

## 2012-09-16 DIAGNOSIS — I10 Essential (primary) hypertension: Secondary | ICD-10-CM | POA: Diagnosis present

## 2012-09-16 DIAGNOSIS — IMO0002 Reserved for concepts with insufficient information to code with codable children: Principal | ICD-10-CM | POA: Diagnosis present

## 2012-09-16 DIAGNOSIS — Z6841 Body Mass Index (BMI) 40.0 and over, adult: Secondary | ICD-10-CM

## 2012-09-16 DIAGNOSIS — M1710 Unilateral primary osteoarthritis, unspecified knee: Principal | ICD-10-CM | POA: Diagnosis present

## 2012-09-16 DIAGNOSIS — E669 Obesity, unspecified: Secondary | ICD-10-CM | POA: Diagnosis present

## 2012-09-16 DIAGNOSIS — H3530 Unspecified macular degeneration: Secondary | ICD-10-CM | POA: Diagnosis present

## 2012-09-16 DIAGNOSIS — Z87891 Personal history of nicotine dependence: Secondary | ICD-10-CM

## 2012-09-16 DIAGNOSIS — M169 Osteoarthritis of hip, unspecified: Secondary | ICD-10-CM | POA: Diagnosis present

## 2012-09-16 DIAGNOSIS — M179 Osteoarthritis of knee, unspecified: Secondary | ICD-10-CM | POA: Diagnosis present

## 2012-09-16 DIAGNOSIS — E785 Hyperlipidemia, unspecified: Secondary | ICD-10-CM | POA: Diagnosis present

## 2012-09-16 DIAGNOSIS — M1610 Unilateral primary osteoarthritis, unspecified hip: Secondary | ICD-10-CM | POA: Diagnosis present

## 2012-09-16 DIAGNOSIS — G4730 Sleep apnea, unspecified: Secondary | ICD-10-CM | POA: Diagnosis present

## 2012-09-16 HISTORY — PX: TOTAL KNEE ARTHROPLASTY: SHX125

## 2012-09-16 SURGERY — ARTHROPLASTY, KNEE, TOTAL
Anesthesia: Regional | Site: Knee | Laterality: Left | Wound class: Clean

## 2012-09-16 MED ORDER — TRANEXAMIC ACID 100 MG/ML IV SOLN
1000.0000 mg | INTRAVENOUS | Status: AC
  Administered 2012-09-16: 1000 mg via INTRAVENOUS
  Filled 2012-09-16 (×2): qty 10

## 2012-09-16 MED ORDER — METHOCARBAMOL 750 MG PO TABS: 750.0000 mg | ORAL_TABLET | Freq: Three times a day (TID) | ORAL | Status: DC

## 2012-09-16 MED ORDER — DEXAMETHASONE SODIUM PHOSPHATE 10 MG/ML IJ SOLN
10.0000 mg | Freq: Four times a day (QID) | INTRAMUSCULAR | Status: AC
  Administered 2012-09-16 – 2012-09-17 (×3): 10 mg via INTRAVENOUS
  Filled 2012-09-16 (×3): qty 1

## 2012-09-16 MED ORDER — POVIDONE-IODINE 7.5 % EX SOLN
Freq: Once | CUTANEOUS | Status: DC
  Filled 2012-09-16: qty 118

## 2012-09-16 MED ORDER — DIPHENHYDRAMINE HCL 12.5 MG/5ML PO ELIX: 12.5000 mg | ORAL_SOLUTION | ORAL | Status: DC | PRN

## 2012-09-16 MED ORDER — FENTANYL CITRATE 0.05 MG/ML IJ SOLN
INTRAMUSCULAR | Status: AC
  Administered 2012-09-16: 100 ug via INTRAVENOUS
  Filled 2012-09-16: qty 2

## 2012-09-16 MED ORDER — FENTANYL CITRATE 0.05 MG/ML IJ SOLN
INTRAMUSCULAR | Status: DC | PRN
  Administered 2012-09-16: 100 ug via INTRAVENOUS
  Administered 2012-09-16 (×2): 50 ug via INTRAVENOUS

## 2012-09-16 MED ORDER — PROPOFOL 10 MG/ML IV BOLUS
INTRAVENOUS | Status: DC | PRN
  Administered 2012-09-16: 200 mg via INTRAVENOUS

## 2012-09-16 MED ORDER — DOCUSATE SODIUM 100 MG PO CAPS
100.0000 mg | ORAL_CAPSULE | Freq: Two times a day (BID) | ORAL | Status: DC
  Administered 2012-09-16 – 2012-09-18 (×5): 100 mg via ORAL
  Filled 2012-09-16 (×6): qty 1

## 2012-09-16 MED ORDER — FENTANYL CITRATE 0.05 MG/ML IJ SOLN: 100.0000 ug | Freq: Once | INTRAMUSCULAR | Status: AC

## 2012-09-16 MED ORDER — LIDOCAINE HCL (CARDIAC) 20 MG/ML IV SOLN
INTRAVENOUS | Status: DC | PRN
  Administered 2012-09-16: 40 mg via INTRAVENOUS

## 2012-09-16 MED ORDER — ASPIRIN EC 325 MG PO TBEC
325.0000 mg | DELAYED_RELEASE_TABLET | Freq: Two times a day (BID) | ORAL | Status: DC
  Administered 2012-09-16 – 2012-09-18 (×4): 325 mg via ORAL
  Filled 2012-09-16 (×6): qty 1

## 2012-09-16 MED ORDER — HYDROMORPHONE HCL PF 1 MG/ML IJ SOLN
INTRAMUSCULAR | Status: AC
  Administered 2012-09-16: 12:00:00
  Filled 2012-09-16: qty 2

## 2012-09-16 MED ORDER — MIDAZOLAM HCL 2 MG/2ML IJ SOLN
INTRAMUSCULAR | Status: AC
  Administered 2012-09-16: 1 mg
  Filled 2012-09-16: qty 2

## 2012-09-16 MED ORDER — MIDAZOLAM HCL 2 MG/2ML IJ SOLN
INTRAMUSCULAR | Status: AC
  Administered 2012-09-16: 13:00:00
  Filled 2012-09-16: qty 2

## 2012-09-16 MED ORDER — ZOLPIDEM TARTRATE 5 MG PO TABS
5.0000 mg | ORAL_TABLET | Freq: Every evening | ORAL | Status: DC | PRN
  Administered 2012-09-17: 5 mg via ORAL
  Filled 2012-09-16: qty 1

## 2012-09-16 MED ORDER — HYDROMORPHONE HCL PF 1 MG/ML IJ SOLN: 0.2500 mg | INTRAMUSCULAR | Status: DC | PRN

## 2012-09-16 MED ORDER — CEFAZOLIN SODIUM-DEXTROSE 2-3 GM-% IV SOLR
2.0000 g | Freq: Four times a day (QID) | INTRAVENOUS | Status: AC
  Administered 2012-09-16 (×2): 2 g via INTRAVENOUS
  Filled 2012-09-16 (×2): qty 50

## 2012-09-16 MED ORDER — LISINOPRIL 20 MG PO TABS
20.0000 mg | ORAL_TABLET | Freq: Every day | ORAL | Status: DC
  Administered 2012-09-17 – 2012-09-18 (×2): 20 mg via ORAL
  Filled 2012-09-16 (×3): qty 1

## 2012-09-16 MED ORDER — HYDROCHLOROTHIAZIDE 25 MG PO TABS
25.0000 mg | ORAL_TABLET | Freq: Every day | ORAL | Status: DC
  Administered 2012-09-17 – 2012-09-18 (×2): 25 mg via ORAL
  Filled 2012-09-16 (×3): qty 1

## 2012-09-16 MED ORDER — OXYCODONE HCL 5 MG PO TABS
5.0000 mg | ORAL_TABLET | Freq: Once | ORAL | Status: AC | PRN
  Administered 2012-09-16: 5 mg via ORAL

## 2012-09-16 MED ORDER — OXYCODONE-ACETAMINOPHEN 5-325 MG PO TABS
1.0000 | ORAL_TABLET | ORAL | Status: DC | PRN
  Administered 2012-09-16: 2 via ORAL

## 2012-09-16 MED ORDER — ALUM & MAG HYDROXIDE-SIMETH 200-200-20 MG/5ML PO SUSP: 30.0000 mL | ORAL | Status: DC | PRN

## 2012-09-16 MED ORDER — FERROUS SULFATE 325 (65 FE) MG PO TABS
325.0000 mg | ORAL_TABLET | Freq: Two times a day (BID) | ORAL | Status: DC
  Administered 2012-09-16 – 2012-09-18 (×4): 325 mg via ORAL
  Filled 2012-09-16 (×6): qty 1

## 2012-09-16 MED ORDER — DEXAMETHASONE SODIUM PHOSPHATE 10 MG/ML IJ SOLN
INTRAMUSCULAR | Status: DC | PRN
  Administered 2012-09-16: 10 mg via INTRAVENOUS

## 2012-09-16 MED ORDER — BUPIVACAINE-EPINEPHRINE PF 0.5-1:200000 % IJ SOLN
INTRAMUSCULAR | Status: DC | PRN
  Administered 2012-09-16: 30 mL

## 2012-09-16 MED ORDER — DEXTROSE-NACL 5-0.45 % IV SOLN
INTRAVENOUS | Status: DC
  Administered 2012-09-16: 75 mL/h via INTRAVENOUS

## 2012-09-16 MED ORDER — POLYETHYLENE GLYCOL 3350 17 G PO PACK
17.0000 g | PACK | Freq: Every day | ORAL | Status: DC | PRN
  Filled 2012-09-16: qty 1

## 2012-09-16 MED ORDER — DEXTROSE 5 % IV SOLN
500.0000 mg | Freq: Four times a day (QID) | INTRAVENOUS | Status: DC | PRN
  Filled 2012-09-16: qty 5

## 2012-09-16 MED ORDER — OXYCODONE HCL 5 MG/5ML PO SOLN: 5.0000 mg | Freq: Once | ORAL | Status: AC | PRN

## 2012-09-16 MED ORDER — ONDANSETRON HCL 4 MG/2ML IJ SOLN
INTRAMUSCULAR | Status: DC | PRN
  Administered 2012-09-16: 4 mg via INTRAVENOUS

## 2012-09-16 MED ORDER — LISINOPRIL-HYDROCHLOROTHIAZIDE 20-25 MG PO TABS: 1.0000 | ORAL_TABLET | Freq: Every day | ORAL | Status: DC

## 2012-09-16 MED ORDER — CEFUROXIME SODIUM 1.5 G IJ SOLR
INTRAMUSCULAR | Status: DC | PRN
  Administered 2012-09-16: 1.5 g

## 2012-09-16 MED ORDER — NITROGLYCERIN 0.4 MG SL SUBL: 0.4000 mg | SUBLINGUAL_TABLET | SUBLINGUAL | Status: DC | PRN

## 2012-09-16 MED ORDER — MIDAZOLAM HCL 2 MG/2ML IJ SOLN: 1.0000 mg | Freq: Once | INTRAMUSCULAR | Status: DC

## 2012-09-16 MED ORDER — AMLODIPINE BESYLATE 10 MG PO TABS
10.0000 mg | ORAL_TABLET | Freq: Every day | ORAL | Status: DC
  Administered 2012-09-17 – 2012-09-18 (×2): 10 mg via ORAL
  Filled 2012-09-16 (×2): qty 1

## 2012-09-16 MED ORDER — PROMETHAZINE HCL 25 MG/ML IJ SOLN: 12.5000 mg | Freq: Four times a day (QID) | INTRAMUSCULAR | Status: DC | PRN

## 2012-09-16 MED ORDER — LACTATED RINGERS IV SOLN
INTRAVENOUS | Status: DC
  Administered 2012-09-16 (×2): via INTRAVENOUS

## 2012-09-16 MED ORDER — ASPIRIN EC 325 MG PO TBEC: 325.0000 mg | DELAYED_RELEASE_TABLET | Freq: Two times a day (BID) | ORAL | Status: DC

## 2012-09-16 MED ORDER — CELECOXIB 200 MG PO CAPS
200.0000 mg | ORAL_CAPSULE | Freq: Two times a day (BID) | ORAL | Status: DC
  Administered 2012-09-16 – 2012-09-18 (×4): 200 mg via ORAL
  Filled 2012-09-16 (×5): qty 1

## 2012-09-16 MED ORDER — HYDROMORPHONE HCL PF 1 MG/ML IJ SOLN: 1.0000 mg | INTRAMUSCULAR | Status: DC | PRN

## 2012-09-16 MED ORDER — METOPROLOL TARTRATE 50 MG PO TABS
50.0000 mg | ORAL_TABLET | Freq: Two times a day (BID) | ORAL | Status: DC
  Administered 2012-09-16 – 2012-09-18 (×4): 50 mg via ORAL
  Filled 2012-09-16 (×5): qty 1

## 2012-09-16 MED ORDER — METHOCARBAMOL 500 MG PO TABS
ORAL_TABLET | ORAL | Status: AC
  Administered 2012-09-16: 13:00:00
  Filled 2012-09-16: qty 1

## 2012-09-16 MED ORDER — SODIUM CHLORIDE 0.9 % IR SOLN
Status: DC | PRN
  Administered 2012-09-16: 1000 mL
  Administered 2012-09-16: 3000 mL

## 2012-09-16 MED ORDER — OXYCODONE-ACETAMINOPHEN 5-325 MG PO TABS: 1.0000 | ORAL_TABLET | Freq: Four times a day (QID) | ORAL | Status: DC | PRN

## 2012-09-16 MED ORDER — CEFAZOLIN SODIUM-DEXTROSE 2-3 GM-% IV SOLR
2.0000 g | INTRAVENOUS | Status: AC
  Administered 2012-09-16: 2 g via INTRAVENOUS
  Filled 2012-09-16: qty 50

## 2012-09-16 MED ORDER — ONDANSETRON HCL 4 MG/2ML IJ SOLN: 4.0000 mg | Freq: Four times a day (QID) | INTRAMUSCULAR | Status: DC | PRN

## 2012-09-16 MED ORDER — OXYCODONE-ACETAMINOPHEN 5-325 MG PO TABS
ORAL_TABLET | ORAL | Status: AC
  Administered 2012-09-16: 13:00:00
  Filled 2012-09-16: qty 2

## 2012-09-16 MED ORDER — METHOCARBAMOL 500 MG PO TABS
500.0000 mg | ORAL_TABLET | Freq: Four times a day (QID) | ORAL | Status: DC | PRN
  Administered 2012-09-16: 500 mg via ORAL

## 2012-09-16 MED ORDER — ONDANSETRON HCL 4 MG PO TABS: 4.0000 mg | ORAL_TABLET | Freq: Four times a day (QID) | ORAL | Status: DC | PRN

## 2012-09-16 MED ORDER — OXYCODONE HCL 5 MG PO TABS
ORAL_TABLET | ORAL | Status: AC
  Administered 2012-09-16: 13:00:00
  Filled 2012-09-16: qty 1

## 2012-09-16 SURGICAL SUPPLY — 70 items
BANDAGE ELASTIC 4 VELCRO ST LF (GAUZE/BANDAGES/DRESSINGS) ×2 IMPLANT
BANDAGE ELASTIC 6 VELCRO ST LF (GAUZE/BANDAGES/DRESSINGS) ×2 IMPLANT
BANDAGE ESMARK 6X9 LF (GAUZE/BANDAGES/DRESSINGS) ×1 IMPLANT
BENZOIN TINCTURE PRP APPL 2/3 (GAUZE/BANDAGES/DRESSINGS) ×2 IMPLANT
BLADE SAGITTAL 25.0X1.19X90 (BLADE) ×2 IMPLANT
BLADE SAW SAG 90X13X1.27 (BLADE) ×2 IMPLANT
BNDG ESMARK 6X9 LF (GAUZE/BANDAGES/DRESSINGS) ×2
BOWL SMART MIX CTS (DISPOSABLE) ×2 IMPLANT
CAPT RP KNEE ×2 IMPLANT
CEMENT HV SMART SET (Cement) ×4 IMPLANT
CLOTH BEACON ORANGE TIMEOUT ST (SAFETY) ×2 IMPLANT
COVER SURGICAL LIGHT HANDLE (MISCELLANEOUS) ×2 IMPLANT
CUFF TOURNIQUET SINGLE 34IN LL (TOURNIQUET CUFF) ×2 IMPLANT
CUFF TOURNIQUET SINGLE 44IN (TOURNIQUET CUFF) IMPLANT
DRAPE EXTREMITY T 121X128X90 (DRAPE) ×2 IMPLANT
DRAPE U-SHAPE 47X51 STRL (DRAPES) ×2 IMPLANT
DRSG PAD ABDOMINAL 8X10 ST (GAUZE/BANDAGES/DRESSINGS) ×2 IMPLANT
DURAPREP 26ML APPLICATOR (WOUND CARE) ×2 IMPLANT
ELECT REM PT RETURN 9FT ADLT (ELECTROSURGICAL) ×2
ELECTRODE REM PT RTRN 9FT ADLT (ELECTROSURGICAL) ×1 IMPLANT
EVACUATOR 1/8 PVC DRAIN (DRAIN) ×2 IMPLANT
FACESHIELD LNG OPTICON STERILE (SAFETY) ×2 IMPLANT
GAUZE XEROFORM 5X9 LF (GAUZE/BANDAGES/DRESSINGS) ×2 IMPLANT
GLOVE BIO SURGEON STRL SZ 6 (GLOVE) ×2 IMPLANT
GLOVE BIO SURGEON STRL SZ7.5 (GLOVE) ×2 IMPLANT
GLOVE BIOGEL PI IND STRL 7.0 (GLOVE) ×2 IMPLANT
GLOVE BIOGEL PI IND STRL 7.5 (GLOVE) ×1 IMPLANT
GLOVE BIOGEL PI IND STRL 8 (GLOVE) ×2 IMPLANT
GLOVE BIOGEL PI INDICATOR 7.0 (GLOVE) ×2
GLOVE BIOGEL PI INDICATOR 7.5 (GLOVE) ×1
GLOVE BIOGEL PI INDICATOR 8 (GLOVE) ×2
GLOVE ECLIPSE 7.0 STRL STRAW (GLOVE) ×2 IMPLANT
GLOVE ECLIPSE 7.5 STRL STRAW (GLOVE) ×4 IMPLANT
GLOVE SURG SS PI 8.5 STRL IVOR (GLOVE) ×1
GLOVE SURG SS PI 8.5 STRL STRW (GLOVE) ×1 IMPLANT
GOWN PREVENTION PLUS LG XLONG (DISPOSABLE) IMPLANT
GOWN STRL NON-REIN LRG LVL3 (GOWN DISPOSABLE) ×4 IMPLANT
GOWN STRL REIN XL XLG (GOWN DISPOSABLE) ×4 IMPLANT
HANDPIECE INTERPULSE COAX TIP (DISPOSABLE) ×1
HOOD PEEL AWAY FACE SHEILD DIS (HOOD) ×6 IMPLANT
IMMOBILIZER KNEE 20 (SOFTGOODS)
IMMOBILIZER KNEE 20 THIGH 36 (SOFTGOODS) IMPLANT
IMMOBILIZER KNEE 22 UNIV (SOFTGOODS) IMPLANT
IMMOBILIZER KNEE 24 THIGH 36 (MISCELLANEOUS) ×1 IMPLANT
IMMOBILIZER KNEE 24 UNIV (MISCELLANEOUS) ×2
KIT BASIN OR (CUSTOM PROCEDURE TRAY) ×2 IMPLANT
KIT ROOM TURNOVER OR (KITS) ×2 IMPLANT
MANIFOLD NEPTUNE II (INSTRUMENTS) ×2 IMPLANT
NEEDLE HYPO 25GX1X1/2 BEV (NEEDLE) IMPLANT
NS IRRIG 1000ML POUR BTL (IV SOLUTION) ×2 IMPLANT
PACK TOTAL JOINT (CUSTOM PROCEDURE TRAY) ×2 IMPLANT
PAD ARMBOARD 7.5X6 YLW CONV (MISCELLANEOUS) ×4 IMPLANT
PAD CAST 4YDX4 CTTN HI CHSV (CAST SUPPLIES) ×1 IMPLANT
PADDING CAST COTTON 4X4 STRL (CAST SUPPLIES) ×1
SET HNDPC FAN SPRY TIP SCT (DISPOSABLE) ×1 IMPLANT
SPONGE GAUZE 4X4 12PLY (GAUZE/BANDAGES/DRESSINGS) ×2 IMPLANT
STAPLER VISISTAT 35W (STAPLE) IMPLANT
STRIP CLOSURE SKIN 1/2X4 (GAUZE/BANDAGES/DRESSINGS) ×2 IMPLANT
SUCTION FRAZIER TIP 10 FR DISP (SUCTIONS) ×2 IMPLANT
SUT MON AB 3-0 SH 27 (SUTURE) ×1
SUT MON AB 3-0 SH27 (SUTURE) ×1 IMPLANT
SUT VIC AB 0 CTB1 27 (SUTURE) ×4 IMPLANT
SUT VIC AB 1 CT1 27 (SUTURE) ×2
SUT VIC AB 1 CT1 27XBRD ANBCTR (SUTURE) ×2 IMPLANT
SUT VIC AB 2-0 CTB1 (SUTURE) ×4 IMPLANT
SYR CONTROL 10ML LL (SYRINGE) IMPLANT
TOWEL OR 17X24 6PK STRL BLUE (TOWEL DISPOSABLE) ×2 IMPLANT
TOWEL OR 17X26 10 PK STRL BLUE (TOWEL DISPOSABLE) ×2 IMPLANT
TRAY FOLEY CATH 16FRSI W/METER (SET/KITS/TRAYS/PACK) ×2 IMPLANT
WATER STERILE IRR 1000ML POUR (IV SOLUTION) ×2 IMPLANT

## 2012-09-16 NOTE — Progress Notes (Signed)
Orthopedic Tech Progress Note Patient Details:  Steven Patton 03-23-1935 [id number]  CPM Left Knee CPM Left Knee: On Left Knee Flexion (Degrees): 60 Left Knee Extension (Degrees): 0   Shawnie Pons 09/16/2012, 12:54 PM

## 2012-09-16 NOTE — Anesthesia Postprocedure Evaluation (Signed)
  Anesthesia Post-op Note  Patient: Daivion Pape  Procedure(s) Performed: Procedure(s): TOTAL KNEE ARTHROPLASTY (Left)  Patient Location: PACU  Anesthesia Type:GA combined with regional for post-op pain  Level of Consciousness: awake and alert   Airway and Oxygen Therapy: Patient Spontanous Breathing and Patient connected to nasal cannula oxygen  Post-op Pain: none  Post-op Assessment: Post-op Vital signs reviewed, Patient's Cardiovascular Status Stable, Respiratory Function Stable, Patent Airway and No signs of Nausea or vomiting  Post-op Vital Signs: Reviewed and stable  Complications: No apparent anesthesia complications

## 2012-09-16 NOTE — H&P (Signed)
TOTAL KNEE ADMISSION H&P  Patient is being admitted for left total knee arthroplasty.  Subjective:  Chief Complaint:left knee pain.  HPI: Steven Patton, 77 y.o. male, has a history of pain and functional disability in the left knee due to arthritis and has failed non-surgical conservative treatments for greater than 12 weeks to includeNSAID's and/or analgesics, corticosteriod injections, viscosupplementation injections, use of assistive devices and weight reduction as appropriate.  Onset of symptoms was gradual, starting 2 years ago with gradually worsening course since that time. The patient noted no past surgery on the left knee(s).  Patient currently rates pain in the left knee(s) at 8 out of 10 with activity. Patient has night pain, worsening of pain with activity and weight bearing, pain that interferes with activities of daily living, pain with passive range of motion and joint swelling.  Patient has evidence of subchondral cysts, subchondral sclerosis and joint space narrowing by imaging studies. This patient has had failure of all conservative care. There is no active infection.  Patient Active Problem List   Diagnosis Date Noted  . Preop cardiovascular exam 07/27/2012  . Elevated white blood cell count 07/27/2012  . Obesity 06/20/2012  . Chest pain 06/08/2012  . Osteoarthritis of left hip 05/18/2012  . Lumbar stenosis with neurogenic claudication 01/29/2012  . Knee osteoarthritis 01/18/2012  . Hypertension 01/04/2012  . Lower extremity edema 01/04/2012  . Skin lesions 01/04/2012  . Hyperlipidemia 01/04/2012  . Preventive measure 01/04/2012  . Sleep apnea 01/04/2012   Past Medical History  Diagnosis Date  . Cataract   . Macular degeneration, left eye   . Hypertension   . Arthritis   . Sleep apnea     2008.Marland KitchenMarland KitchenWEARS CPAP    Past Surgical History  Procedure Laterality Date  . Knee surgery    . Cardiac catheterization  06-10-12  . Eye surgery      CATARACT RIGHT EYE  .  Tonsillectomy      No prescriptions prior to admission   No Known Allergies  History  Substance Use Topics  . Smoking status: Former Games developer  . Smokeless tobacco: Not on file  . Alcohol Use: No    Family History  Problem Relation Age of Onset  . Cancer Daughter      ROS  Objective:  Physical Exam  Vital signs in last 24 hours: Pulse Rate:  [63] 63 (08/14 1502) BP: (123)/(75) 123/75 mmHg (08/14 1502) Weight:  [123.378 kg (272 lb)] 123.378 kg (272 lb) (08/14 1502)  Labs:   Estimated body mass index is 42.51 kg/(m^2) as calculated from the following:   Height as of 06/22/12: 5\' 9"  (1.753 m).   Weight as of 07/18/12: 130.636 kg (288 lb).   Imaging Review Plain radiographs demonstrate severe degenerative joint disease of the left knee(s). The overall alignment ismild valgus. The bone quality appears to be good for age and reported activity level.  Assessment/Plan:  End stage arthritis, left knee   The patient history, physical examination, clinical judgment of the provider and imaging studies are consistent with end stage degenerative joint disease of the left knee(s) and total knee arthroplasty is deemed medically necessary. The treatment options including medical management, injection therapy arthroscopy and arthroplasty were discussed at length. The risks and benefits of total knee arthroplasty were presented and reviewed. The risks due to aseptic loosening, infection, stiffness, patella tracking problems, thromboembolic complications and other imponderables were discussed. The patient acknowledged the explanation, agreed to proceed with the plan and consent was signed. Patient  is being admitted for inpatient treatment for surgery, pain control, PT, OT, prophylactic antibiotics, VTE prophylaxis, progressive ambulation and ADL's and discharge planning. The patient is planning to be discharged home with home health services

## 2012-09-16 NOTE — Op Note (Signed)
Steven Patton, PERSONS NO.:  [id number]  MEDICAL RECORD NO.:  [id number]  LOCATION:  5N07C                        FACILITY:  [facility]  PHYSICIAN:  Harvie Junior, M.D.   DATE OF BIRTH:  1935-08-20  DATE OF PROCEDURE:  09/16/2012 DATE OF DISCHARGE:                              OPERATIVE REPORT   PREOPERATIVE DIAGNOSES: 1. End-stage degenerative joint disease, left knee. 2. Fragmented superior medial patella.  POSTOPERATIVE DIAGNOSES: 1. End-stage degenerative joint disease, left knee. 2. Fragmented superior medial patella.  PROCEDURE: 1. Left total knee replacement with a Sigma system, size 4 femur, size     5 tibia, 10 mm bridging bearing, and a 35 mm all polyethylene     patella. 2. Excision of superior medial patellar bone.  SURGEON:  Harvie Junior, M.D.  ASSISTANT:  Marshia Ly, PA.  ANESTHESIA:  General.  BRIEF HISTORY:  Steven Patton is a 77 year old male with a long history of having had significant complaints of left knee pain.  We treated him conservatively for prolonged period of time.  He had undergone activity modification, injection therapy, use of a cane, and weight loss.  With failure of all conservative care, he was taken to the operating room for left total knee replacement.  Preoperative x-ray showed bone-on-bone changes and he was having significant night pain and light activity pain.  Once this was completed, after a long discussion, he was taken to the operating room for left total knee replacement.  The patient was noted to have this fragmentation of the superior medial patella and we discussed this preoperatively, felt that excising this would be the appropriate course of action, and he is brought to the operating room for this procedure.  PROCEDURE:  The patient was taken to the operating room and after adequate anesthesia was achieved with general anesthetic, the patient was placed supine on the operating table.  Left leg was  then prepped and draped in usual sterile fashion.  Following this, the leg was exsanguinated.  Blood pressure tourniquet inflated to 350 mmHg. Following this, an incision was made in the midline, subcutaneous tissue down the level of the extensor mechanism.  A medial parapatellar arthrotomy was undertaken.  Once this was done, attention was turned towards the removal of medial and lateral meniscus, retropatellar fat pad, synovium in the anterior aspect of the femur, and anterior and posterior cruciates.  Attention then turned to medial lateral meniscus were removed.  Retropatellar fat pad, synovium in the anterior aspect of femur and anterior-posterior cruciates.  Once this was done, the tibia was cut perpendicular to its long axis with an extramedullary guide 2 mm below the low side.  The femur was then cut with an intramedullary guide, 5 degree valgus, and at this point, spacer blocks were put in place.  Excellent gap balance was achieved.  At this point, attention was then turned to the femur, which was sized to a size 4 and anterior and posterior cuts were made chamfer and box.  Attention turned to the tibia, really a 4 was too small here, so we went with a 5 and this was drilled and keeled.  Trial components were put in place.  An excellent range of motion and stability was achieved.  Attention was then turned towards the patella, which was cut down to a level of 14 mm.  This was done, the superior medial fragment and portion of the patella was identified and this was shelled out of the extensor mechanism.  The patella itself would have been much larger, but this fragment of piece, the 35 was what would fit the patellar bone.  This paddle was used, it was drilled and keeled, and trial 35 was put in place.  Knee was put through a range of motion, excellent stability, range of motion was achieved.  At this point, the wound was copiously and thoroughly lavaged, suctioned dry.  Attention  was turned towards removal of trial components and the final components were then cemented into place, size 4 femur, size 5 tibia, 10 bridging trial was used, and then a 35 all poly patella held with a clamp.  Once all bone was allowed to dry and excess bone cement had been removed, the tourniquet was let down.  All bleeding was controlled with electrocautery.  At this point, we finally opened a 10 poly and put this in place.  Excellent range of motion and stability was achieved.  The final 10 mm poly was chosen and placed and the knee put through a range of motion, excellent stability, range of motion was achieved.  Medium Hemovac drain was placed and the medial patellar arthrotomy was closed with 1 Vicryl running, skin with 0 and 2- 0 Vicryl, and then 3-0 Monocryl subcuticular.  Benzoin and Steri-Strips were applied.  Sterile compressive dressing was applied and the patient was taken to the recovery room and he was noted to be in a satisfactory condition.  Estimated blood loss for the procedure was seizure was minimal.     Harvie Junior, M.D.     Ranae Plumber  D:  09/16/2012  T:  09/16/2012  Job:  [id number]

## 2012-09-16 NOTE — Brief Op Note (Signed)
09/16/2012  1:44 PM  PATIENT:  Steven Patton  77 y.o. male  PRE-OPERATIVE DIAGNOSIS:  DEGENERATIVE JOINT DISEASE, left knee  POST-OPERATIVE DIAGNOSIS:  DEGENERATIVE JOINT DISEASE, left knee  PROCEDURE:  Procedure(s): TOTAL KNEE ARTHROPLASTY (Left)  SURGEON:  Surgeon(s) and Role:    * Harvie Junior, MD - Primary  PHYSICIAN ASSISTANT:   ASSISTANTS: bethune   ANESTHESIA:   general  EBL:  Total I/O In: 1300 [I.V.:1300] Out: 75 [Urine:75]  BLOOD ADMINISTERED:none  DRAINS: (1) Hemovact drain(s) in the l knee with  Suction Open   LOCAL MEDICATIONS USED:  NONE  SPECIMEN:  No Specimen  DISPOSITION OF SPECIMEN:  N/A  COUNTS:  YES  TOURNIQUET:   Total Tourniquet Time Documented: Thigh (Left) - 55 minutes Total: Thigh (Left) - 55 minutes   DICTATION: .Other Dictation: Dictation Number [id number]  PLAN OF CARE: Admit to inpatient   PATIENT DISPOSITION:  PACU - hemodynamically stable.   Delay start of Pharmacological VTE agent (>24hrs) due to surgical blood loss or risk of bleeding: no

## 2012-09-16 NOTE — Anesthesia Procedure Notes (Signed)
Anesthesia Regional Block:  Femoral nerve block  Pre-Anesthetic Checklist: ,, timeout performed, Correct Patient, Correct Site, Correct Laterality, Correct Procedure, Correct Position, site marked, Risks and benefits discussed, pre-op evaluation,  At surgeon's request and post-op pain management  Laterality: Left  Prep: Maximum Sterile Barrier Precautions used and chloraprep       Needles:  Injection technique: Single-shot  Needle Type: Echogenic Stimulator Needle      Needle Gauge: 22 and 22 G    Additional Needles:  Procedures: ultrasound guided (picture in chart) Femoral nerve block  Nerve Stimulator or Paresthesia:  Response: Patellar respose,   Additional Responses:   Narrative:  Start time: 09/16/2012 8:30 AM End time: 09/16/2012 8:40 AM Injection made incrementally with aspirations every 5 mL. Anesthesiologist: Jolyne Laye,MD  Additional Notes: 2% Lidocaine skin wheel.   Femoral nerve block

## 2012-09-16 NOTE — Transfer of Care (Signed)
Immediate Anesthesia Transfer of Care Note  Patient: Steven Patton  Procedure(s) Performed: Procedure(s): TOTAL KNEE ARTHROPLASTY (Left)  Patient Location: PACU  Anesthesia Type:General  Level of Consciousness: awake, alert  and oriented  Airway & Oxygen Therapy: Patient Spontanous Breathing and Patient connected to nasal cannula oxygen  Post-op Assessment: Report given to PACU RN and Post -op Vital signs reviewed and stable  Post vital signs: Reviewed and stable  Complications: No apparent anesthesia complications

## 2012-09-16 NOTE — Evaluation (Signed)
Physical Therapy Evaluation Patient Details Name: Steven Patton MRN: [id number] DOB: [birth year] Today's Date: 09/16/2012 Time: 3086-5784 PT Time Calculation (min): 27 min  PT Assessment / Plan / Recommendation History of Present Illness  pt presents with L TKA.    Clinical Impression  Pt would benfit from continued PT to improve overall mobility and maximize independence.  Pt motivated and with good family support.      PT Assessment  Patient needs continued PT services    Follow Up Recommendations  Home health PT;Supervision/Assistance - 24 hour    Does the patient have the potential to tolerate intense rehabilitation      Barriers to Discharge        Equipment Recommendations  Rolling walker with 5" wheels;3in1 (PT)    Recommendations for Other Services OT consult   Frequency 7X/week    Precautions / Restrictions Precautions Precautions: Knee Precaution Booklet Issued: Yes (comment) Required Braces or Orthoses: Knee Immobilizer - Left Knee Immobilizer - Left: Discontinue once straight leg raise with < 10 degree lag Restrictions Weight Bearing Restrictions: Yes LLE Weight Bearing: Weight bearing as tolerated   Pertinent Vitals/Pain Indicates tightness in L knee.        Mobility  Bed Mobility Bed Mobility: Supine to Sit;Sitting - Scoot to Edge of Bed Supine to Sit: 4: Min assist;HOB elevated;With rails Sitting - Scoot to Edge of Bed: 4: Min assist Details for Bed Mobility Assistance: A with L LE only.  cues for sequencing and technique.   Transfers Transfers: Sit to Stand;Stand to Sit Sit to Stand: 4: Min assist;With upper extremity assist;From bed Stand to Sit: 4: Min assist;With upper extremity assist;To chair/3-in-1;With armrests Details for Transfer Assistance: cues for UE use, positioning of LEs, controlling descent to chair.   Ambulation/Gait Ambulation/Gait Assistance: 4: Min assist Ambulation Distance (Feet): 10 Feet Assistive device: Rolling  walker Ambulation/Gait Assistance Details: cues for positioning within RW, upright posture, gait sequencing.   Gait Pattern: Step-to pattern;Decreased step length - right;Decreased stance time - left Stairs: No Wheelchair Mobility Wheelchair Mobility: No    Exercises Total Joint Exercises Ankle Circles/Pumps: AROM;Both;10 reps Quad Sets: AROM;Both;10 reps Long Arc Quad: AAROM;Left;10 reps   PT Diagnosis: Abnormality of gait;Acute pain  PT Problem List: Decreased strength;Decreased range of motion;Decreased activity tolerance;Decreased balance;Decreased mobility;Decreased knowledge of use of DME;Pain PT Treatment Interventions: DME instruction;Gait training;Functional mobility training;Therapeutic activities;Therapeutic exercise;Balance training;Patient/family education     PT Goals(Current goals can be found in the care plan section) Acute Rehab PT Goals Patient Stated Goal: Home PT Goal Formulation: With patient Time For Goal Achievement: 09/23/12 Potential to Achieve Goals: Good  Visit Information  Last PT Received On: 09/16/12 Assistance Needed: +1 History of Present Illness: pt presents with L TKA.         Prior Functioning  Home Living Family/patient expects to be discharged to:: Private residence Living Arrangements: Spouse/significant other;Children Available Help at Discharge: Family;Available 24 hours/day Type of Home: House Home Access: Level entry Home Layout: Two level;Able to live on main level with bedroom/bathroom Home Equipment: Walker - 4 wheels Prior Function Level of Independence: Independent with assistive device(s) Communication Communication: No difficulties    Cognition  Cognition Arousal/Alertness: Awake/alert Behavior During Therapy: WFL for tasks assessed/performed Overall Cognitive Status: Within Functional Limits for tasks assessed    Extremity/Trunk Assessment Upper Extremity Assessment Upper Extremity Assessment: Overall WFL for tasks  assessed Lower Extremity Assessment Lower Extremity Assessment: LLE deficits/detail LLE Deficits / Details: AAROM ~10 - 75   Balance Balance  Balance Assessed: No  End of Session PT - End of Session Equipment Utilized During Treatment: Gait belt;Left knee immobilizer Activity Tolerance: Patient tolerated treatment well Patient left: in chair;with call bell/phone within reach;with family/visitor present Nurse Communication: Mobility status CPM Left Knee CPM Left Knee: Off Left Knee Flexion (Degrees): 60 Left Knee Extension (Degrees): 0  GP     RitenourAlison Murray, [place] [phone] 09/16/2012, 2:57 PM

## 2012-09-16 NOTE — Anesthesia Preprocedure Evaluation (Addendum)
Anesthesia Evaluation  Patient identified by MRN, date of birth, ID band Patient awake    Reviewed: Allergy & Precautions, H&P , NPO status , Patient's Chart, lab work & pertinent test results, reviewed documented beta blocker date and time   Airway Mallampati: II TM Distance: >3 FB Neck ROM: Full    Dental no notable dental hx. (+) Edentulous Lower, Edentulous Upper and Dental Advisory Given   Pulmonary sleep apnea and Continuous Positive Airway Pressure Ventilation ,  breath sounds clear to auscultation  Pulmonary exam normal       Cardiovascular hypertension, On Medications and On Home Beta Blockers Rhythm:Regular Rate:Normal  Cardiac cath 06/10/12= No signficant CAD, EF 60%   Neuro/Psych negative neurological ROS  negative psych ROS   GI/Hepatic negative GI ROS, Neg liver ROS,   Endo/Other  Morbid obesity  Renal/GU negative Renal ROS  negative genitourinary   Musculoskeletal   Abdominal   Peds  Hematology negative hematology ROS (+)   Anesthesia Other Findings   Reproductive/Obstetrics negative OB ROS                        Anesthesia Physical Anesthesia Plan  ASA: III  Anesthesia Plan: General and Regional   Post-op Pain Management:    Induction: Intravenous  Airway Management Planned: Oral ETT  Additional Equipment:   Intra-op Plan:   Post-operative Plan: Extubation in OR  Informed Consent: I have reviewed the patients History and Physical, chart, labs and discussed the procedure including the risks, benefits and alternatives for the proposed anesthesia with the patient or authorized representative who has indicated his/her understanding and acceptance.   Dental advisory given  Plan Discussed with: CRNA  Anesthesia Plan Comments:         Anesthesia Quick Evaluation

## 2012-09-16 NOTE — Progress Notes (Signed)
Orthopedic Tech Progress Note Patient Details:  Steven Patton [birth year] [id number] On cpm at 8:10 pm LLE 0-60 Patient ID: Steven Patton, male   DOB: 01/22/1936, 77 y.o.   MRN: [id number]   Steven Patton 09/16/2012, 8:10 PM

## 2012-09-17 LAB — BASIC METABOLIC PANEL
BUN: 25 mg/dL — ABNORMAL HIGH (ref 6–23)
Chloride: 100 mEq/L (ref 96–112)
GFR calc Af Amer: >90 mL/min (ref >90)
Potassium: 4.1 mEq/L (ref 3.5–5.1)
Sodium: 136 mEq/L (ref 135–145)

## 2012-09-17 LAB — CBC
HCT: 39.9 % (ref 39.0–52.0)
Hemoglobin: 14.1 g/dL (ref 13.0–17.0)
RDW: 13.2 % (ref 11.5–15.5)
WBC: 22.4 10*3/uL — ABNORMAL HIGH (ref 4.0–10.5)

## 2012-09-17 NOTE — Progress Notes (Signed)
Physical Therapy Treatment Patient Details Name: Steven Patton MRN: [id number] DOB: 1935-07-07 Today's Date: 09/17/2012 Time: 4098-1191 PT Time Calculation (min): 25 min  PT Assessment / Plan / Recommendation  History of Present Illness     PT Comments   Pt ambulated in hallway and performed exercises.  Pt voices concern over still having knee "popping" during ambulation.  Pt denies pain during ambulation.   Follow Up Recommendations  Home health PT;Supervision/Assistance - 24 hour     Does the patient have the potential to tolerate intense rehabilitation     Barriers to Discharge        Equipment Recommendations  Rolling walker with 5" wheels;3in1 (PT)    Recommendations for Other Services    Frequency     Progress towards PT Goals Progress towards PT goals: Progressing toward goals  Plan Current plan remains appropriate    Precautions / Restrictions Precautions Precautions: Knee Precaution Booklet Issued: Yes (comment) Required Braces or Orthoses: Knee Immobilizer - Left Knee Immobilizer - Left: Discontinue once straight leg raise with < 10 degree lag Restrictions Weight Bearing Restrictions: Yes LLE Weight Bearing: Weight bearing as tolerated   Pertinent Vitals/Pain n/a    Mobility  Bed Mobility Bed Mobility: Sit to Supine Sit to Supine: 4: Min assist Details for Bed Mobility Assistance: assist for L LE Transfers Transfers: Sit to Stand;Stand to Sit Sit to Stand: 4: Min assist;With upper extremity assist;From chair/3-in-1;With armrests Stand to Sit: 4: Min guard;With upper extremity assist;With armrests;To bed Details for Transfer Assistance: cues for UE use, positioning of LEs, assist to steady upon rise Ambulation/Gait Ambulation/Gait Assistance: 4: Min guard Ambulation Distance (Feet): 140 Feet Assistive device: Rolling walker Ambulation/Gait Assistance Details: verbal cues for RW distance and step length, pt concerned "popping" noise still present (states  he had surgery to get rid of the popping) Gait Pattern: Step-to pattern;Decreased step length - right;Decreased stance time - left    Exercises Total Joint Exercises Ankle Circles/Pumps: AROM;Both;20 reps;Supine Quad Sets: AROM;Both;20 reps;Supine Towel Squeeze: AROM;Both;20 reps Short Arc Quad: AROM;Left;Supine;20 reps Heel Slides: AAROM;Left;20 reps;Supine Hip ABduction/ADduction: Supine;20 reps;Left;AROM Straight Leg Raises: AAROM;Left;10 reps;Supine Goniometric ROM: knee flexion approx 85* pt reports bandaging limiting flexion   PT Diagnosis:    PT Problem List:   PT Treatment Interventions:     PT Goals (current goals can now be found in the care plan section)    Visit Information  Last PT Received On: 09/17/12 Assistance Needed: +1    Subjective Data      Cognition  Cognition Arousal/Alertness: Awake/alert Behavior During Therapy: [facility] for tasks assessed/performed Overall Cognitive Status: Within Functional Limits for tasks assessed    Balance     End of Session PT - End of Session Equipment Utilized During Treatment: Gait belt;Left knee immobilizer Activity Tolerance: Patient tolerated treatment well Patient left: in bed;with call bell/phone within reach;with family/visitor present   GP     Lyllie Cobbins,KATHrine E 09/17/2012, 12:07 PM Zenovia Jarred, PT, DPT 09/17/2012 Pager: [phone]

## 2012-09-17 NOTE — Progress Notes (Addendum)
Physical Therapy Treatment Note  Pt with audible popping noise during gait which pt states he feels in his L knee however upon palpation of greater trochanters during gait, therapist could feel slight shifting of L greater trochanter which would occur at same time of popping sound.  Pt also states he needs back surgery, so explained pt needed to discuss with MD in regards to managing popping noise (?from knee, hip or back) as pt states his main reason for TKR was to get rid of popping noise.  Also educated spouse on proper application of KI since she will assist pt at home.   09/17/12 1500  PT Visit Information  Last PT Received On 09/17/12  Assistance Needed +1  PT Time Calculation  PT Start Time 1319  PT Stop Time 1342  PT Time Calculation (min) 23 min  Subjective Data  Subjective I need back surgery too.  Precautions  Precautions Knee  Precaution Booklet Issued Yes (comment)  Required Braces or Orthoses Knee Immobilizer - Left  Knee Immobilizer - Left Discontinue once straight leg raise with < 10 degree lag  Restrictions  LLE Weight Bearing WBAT  Cognition  Arousal/Alertness Awake/alert  Behavior During Therapy WFL for tasks assessed/performed  Overall Cognitive Status Within Functional Limits for tasks assessed  Bed Mobility  Bed Mobility Supine to Sit;Sit to Supine  Supine to Sit 5: Supervision;HOB elevated  Sit to Supine 4: Min assist  Details for Bed Mobility Assistance assist for L LE  Transfers  Transfers Sit to Stand;Stand to Sit  Sit to Stand 4: Min guard;From bed;With upper extremity assist  Stand to Sit 4: Min guard;With upper extremity assist;To bed  Details for Transfer Assistance verbal cue for safety upon descent  Ambulation/Gait  Ambulation/Gait Assistance 4: Min guard;5: Supervision  Ambulation Distance (Feet) 200 Feet  Assistive device Rolling walker  Ambulation/Gait Assistance Details pt continues to have audible popping noise during ambulation however  states no pain, palpated hips during ambulation and could feel L greater trochanter shift slighty during gait as popping heard  Gait Pattern Step-to pattern;Decreased step length - right;Decreased stance time - left  PT - End of Session  Equipment Utilized During Treatment Left knee immobilizer  Activity Tolerance Patient tolerated treatment well  Patient left in bed;with call bell/phone within reach;with family/visitor present  PT - Assessment/Plan  PT Plan Current plan remains appropriate  Follow Up Recommendations Home health PT;Supervision/Assistance - 24 hour  PT equipment Rolling walker with 5" wheels;3in1 (PT)  PT Goal Progression  Progress towards PT goals Progressing toward goals  PT General Charges  $$ ACUTE PT VISIT 1 Procedure  PT Treatments  $Gait Training 23-37 mins   Zenovia Jarred, PT, DPT 09/17/2012 Pager: [phone]

## 2012-09-17 NOTE — Progress Notes (Addendum)
Subjective: POD 1 L TKA Sitting up, doing well.  Tol PO.   Objective: Vital signs in last 24 hours: Temp:  [97.4 F (36.3 C)-98.6 F (37 C)] 98.6 F (37 C) (08/16 0606) Pulse Rate:  [56-68] 66 (08/16 0606) Resp:  [8-18] 16 (08/16 0606) BP: (114-149)/(60-98) 130/60 mmHg (08/16 0606) SpO2:  [94 %-99 %] 94 % (08/16 0606) Weight:  [123.378 kg (272 lb)] 123.378 kg (272 lb) (08/16 0550)  Intake/Output from previous day: 08/15 0701 - 08/16 0700 In: 1540 [P.O.:240; I.V.:1300] Out: 2270 [Urine:1775; Drains:495] Intake/Output this shift:     Recent Labs  09/17/12 0600  HGB 14.1    Recent Labs  09/17/12 0600  WBC 22.4*  RBC 4.53  HCT 39.9  PLT 181    Recent Labs  09/17/12 0600  NA 136  K 4.1  CL 100  CO2 25  BUN 25*  CREATININE 0.97  GLUCOSE 167*  CALCIUM 8.9   No results found for this basename: LABPT, INR,  in the last 72 hours  Neurovascular intact dressing C/D/I  Assessment/Plan: WBC markedly elevated, likely stress demargination  Will re-check in AM D/c drain HP IVFs Cont PO protocol--poss D/C tomorrow if tol PO pain meds and clears PT   Steven Patton A. 09/17/2012, 10:18 AM

## 2012-09-18 LAB — CBC
HCT: 38.9 % — ABNORMAL LOW (ref 39.0–52.0)
Hemoglobin: 13.3 g/dL (ref 13.0–17.0)
MCHC: 34.2 g/dL (ref 30.0–36.0)
RBC: 4.38 MIL/uL (ref 4.22–5.81)

## 2012-09-18 NOTE — Discharge Summary (Signed)
Physician Discharge Summary  Patient ID: Steven Patton MRN: [id number] DOB/AGE: 07/24/1935 77 y.o.  Admit date: 09/16/2012 Discharge date: 09/18/2012  Admission Diagnoses:  Knee osteoarthritis  Discharge Diagnoses:  Principal Problem:   Knee osteoarthritis   Past Medical History  Diagnosis Date  . Cataract   . Macular degeneration, left eye   . Hypertension   . Arthritis   . Sleep apnea     2008.Marland KitchenMarland KitchenWEARS CPAP    Surgeries: Procedure(s): TOTAL KNEE ARTHROPLASTY on 09/16/2012   Consultants (if any):    Discharged Condition: Improved  Hospital Course: Steven Patton is an 77 y.o. male who was admitted 09/16/2012 with a diagnosis of Knee osteoarthritis and went to the operating room on 09/16/2012 and underwent the above named procedures.    He was given perioperative antibiotics:  Anti-infectives   Start     Dose/Rate Route Frequency Ordered Stop   09/16/12 1600  ceFAZolin (ANCEF) IVPB 2 g/50 mL premix     2 g 100 mL/hr over 30 Minutes Intravenous Every 6 hours 09/16/12 1351 09/16/12 2238   09/16/12 1117  cefUROXime (ZINACEF) injection  Status:  Discontinued       As needed 09/16/12 1117 09/16/12 1212   09/16/12 0730  ceFAZolin (ANCEF) IVPB 2 g/50 mL premix     2 g 100 mL/hr over 30 Minutes Intravenous On call to O.R. 09/16/12 2952 09/16/12 1005    .  He was given sequential compression devices, early ambulation, and ASA for DVT prophylaxis.  He benefited maximally from the hospital stay and there were no complications.   On day of d/c, HCT was stable.  Pt. Notes that the popping he experienced before surgery remains, but knee pain gone.  PT noted source of popping may be along greater trochanter.  Pt will monitor and advise Dr. Luiz Blare upon RTC in 2 weeks.  Recent vital signs:  Filed Vitals:   09/18/12 0546  BP: 141/78  Pulse: 73  Temp: 98 F (36.7 C)  Resp: 18    Recent laboratory studies:  Lab Results  Component Value Date   HGB 13.3 09/18/2012   HGB 14.1  09/17/2012   HGB 16.8 09/07/2012   Lab Results  Component Value Date   WBC 22.8* 09/18/2012   PLT 175 09/18/2012   Lab Results  Component Value Date   INR 0.93 09/07/2012   Lab Results  Component Value Date   NA 136 09/17/2012   K 4.1 09/17/2012   CL 100 09/17/2012   CO2 25 09/17/2012   BUN 25* 09/17/2012   CREATININE 0.97 09/17/2012   GLUCOSE 167* 09/17/2012    Discharge Medications:     Medication List    TAKE these medications       methocarbamol 750 MG tablet  Commonly known as:  ROBAXIN-750  Take 1 tablet (750 mg total) by mouth 3 (three) times daily. Prn spasm.     oxyCODONE-acetaminophen 5-325 MG per tablet  Commonly known as:  PERCOCET/ROXICET  Take 1-2 tablets by mouth every 6 (six) hours as needed for pain.      ASK your doctor about these medications       AMBULATORY NON FORMULARY MEDICATION  [employer] CPAP Machine, needs humidifier to fit CPAP machine.     AMBULATORY NON FORMULARY MEDICATION  Shower chair.     AMBULATORY NON FORMULARY MEDICATION  Reclining lift chair.     amLODipine 10 MG tablet  Commonly known as:  NORVASC  Take 10 mg by mouth daily.  aspirin EC 81 MG tablet  Take 1 tablet (81 mg total) by mouth daily.  Ask about: Which instructions should I use?     aspirin EC 325 MG tablet  Take 1 tablet (325 mg total) by mouth 2 (two) times daily after a meal.  Ask about: Which instructions should I use?     HYDROcodone-acetaminophen 10-325 MG per tablet  Commonly known as:  NORCO  Take 1 tablet by mouth every 8 (eight) hours as needed for pain.     lisinopril-hydrochlorothiazide 20-25 MG per tablet  Commonly known as:  PRINZIDE,ZESTORETIC  Take 1 tablet by mouth daily.     meloxicam 15 MG tablet  Commonly known as:  MOBIC  Take 15 mg by mouth.     metoprolol 50 MG tablet  Commonly known as:  LOPRESSOR  Take 50 mg by mouth 2 (two) times daily.     nitroGLYCERIN 0.4 MG SL tablet  Commonly known as:  NITROSTAT  Place 0.4 mg under the  tongue every 5 (five) minutes as needed for chest pain.     phentermine 37.5 MG capsule  Take 37.5 mg by mouth every morning.     traMADol 50 MG tablet  Commonly known as:  ULTRAM  Take 50 mg by mouth every 8 (eight) hours as needed for pain.        Diagnostic Studies: Dg Epidurography  09/12/2012   *RADIOLOGY REPORT*  Clinical Data: Spondylosis without myelopathy.  Spinal stenosis L4- 5.  Brief response to the initial foraminal injection.  Request for repeat.  Left L4  NERVE ROOT BLOCK AND TRANSFORAMINAL EPIDURAL STEROID INJECTION  Technique:  The overlying skin was prepped with Betadineand draped in  sterile fashion. Skin anesthesia was carried out using 1% Lidocaine. A curved 5 inch 22 gauge spinal needle was directed into the superior ventral neural foramen on the left at L4-5. Injection of a few drops of Omnipaque 180  demonstrates spread  outlining the left L4  nerve root. No intravascular extension.  120mg   Depo- Medrol and 2cc 1% Lidocaine were subsequently administered.  No apparent complication. The patient tolerated the procedure without difficulty and was transferred to recovery in excellent condition.  Flouroscopy time: 1 minute 20 seconds  IMPRESSION: Technically successful second left L4 selective nerve root block and transforaminal epidural steroid injection.   Original Report Authenticated By: Paulina Fusi, M.D.   Dg Epidurography  08/29/2012   *RADIOLOGY REPORT*  Clinical data: Severe spinal stenosis L4-5.  Back and left lower extremity pain.  SELECTIVE NERVE ROOT BLOCK AND TRANSFORAMINAL EPIDURAL STEROID INJECTION UNDER FLUOROSCOPY  Technique:  The procedure, risks (including but not limited to bleeding, infection, organ damage), benefits, and alternatives were explained to the patient.  Questions regarding the procedure were encouraged and answered.  The patient understands and consents to the procedure. An appropriate skin entry site was determined under fluoroscopy.  Operator  donned sterile gloves and mask. Site was marked, prepped with Betadine, draped in usual sterile fashion, infiltrated locally with 1% lidocaine.  A 22 gauge spinal needle was advanced to the superior ventral margin of the left L4-5 neural foramen.  Diagnostic injection of 2 ml Omnipaque 180 showed partial outlining of the exiting nerve root as well as epidural extension of contrast, with no intravascular or subarachnoid component.  120 mg Depo-Medrol in 3 ml lidocaine 1% was administered.  The patient tolerated procedure well, with no immediate complication.  Fluoroscopy time: 24 seconds  IMPRESSION: 1.  Technically successful left L4-5  selective nerve root block and transforaminal epidural steroid injection   Original Report Authenticated By: D. Andria Rhein, MD    Disposition: 01-Home or Self Care       Future Appointments Provider Department Dept Phone   11/15/2012 1:45 PM Monica Becton, MD [facility] [place] [phone]      Follow-up Information   Follow up with GRAVES,JOHN L, MD. Schedule an appointment as soon as possible for a visit in 2 weeks.   Specialty:  Orthopedic Surgery   Contact information:   [address] [place] [address] [phone]        Signed: Janee Morn, Nikesha Kwasny A. 09/18/2012, 8:12 AM

## 2012-09-18 NOTE — Progress Notes (Signed)
Physical Therapy Treatment Patient Details Name: Steven Patton MRN: [id number] DOB: 03-Jul-1935 Today's Date: 09/18/2012 Time: 0347-4259 PT Time Calculation (min): 30 min  PT Assessment / Plan / Recommendation  History of Present Illness     PT Comments   Making good progress with mobility; Will have adequat assist at home; discussed car transfer; OK for dc from PT standpoint  Follow Up Recommendations  Home health PT;Supervision/Assistance - 24 hour     Does the patient have the potential to tolerate intense rehabilitation     Barriers to Discharge        Equipment Recommendations  Rolling walker with 5" wheels;3in1 (PT)    Recommendations for Other Services    Frequency 7X/week   Progress towards PT Goals Progress towards PT goals: Progressing toward goals  Plan Current plan remains appropriate    Precautions / Restrictions Precautions Precautions: Knee Precaution Booklet Issued: Yes (comment) Required Braces or Orthoses: Knee Immobilizer - Left Knee Immobilizer - Left: Discontinue once straight leg raise with < 10 degree lag Restrictions LLE Weight Bearing: Weight bearing as tolerated   Pertinent Vitals/Pain Minimal pain with mobility    Mobility  Bed Mobility Bed Mobility: Sit to Supine Sit to Supine: 4: Min guard Details for Bed Mobility Assistance: Pt used gait belt "lasso" to assist RLE into bed Transfers Transfers: Sit to Stand;Stand to Sit Sit to Stand: 4: Min guard;From bed;With upper extremity assist Stand to Sit: 4: Min guard;With upper extremity assist;To bed Details for Transfer Assistance: verbal cue for safety upon descent Ambulation/Gait Ambulation/Gait Assistance: 5: Supervision Ambulation Distance (Feet): 200 Feet Assistive device: Rolling walker Ambulation/Gait Assistance Details: Cues for upright posture, and to push down into RW with UEs to assist with upright posture; cues also to extend hips; Still noted popping, but pt reports less so than  yesterday Gait Pattern: Decreased step length - right;Decreased stance time - left;Step-through pattern    Exercises Total Joint Exercises Ankle Circles/Pumps: AROM;Both;20 reps;Supine Quad Sets: AROM;Left;10 reps Straight Leg Raises: AAROM;Left;10 reps;Supine Long Arc Quad: AAROM;Left;10 reps   PT Diagnosis:    PT Problem List:   PT Treatment Interventions:     PT Goals (current goals can now be found in the care plan section) Acute Rehab PT Goals Patient Stated Goal: Home Time For Goal Achievement: 09/23/12 Potential to Achieve Goals: Good  Visit Information  Last PT Received On: 09/18/12 Assistance Needed: +1    Subjective Data  Subjective: I need back surgery too. looking forward to discharge Patient Stated Goal: Home   Cognition  Cognition Arousal/Alertness: Awake/alert Behavior During Therapy: WFL for tasks assessed/performed Overall Cognitive Status: Within Functional Limits for tasks assessed    Balance     End of Session PT - End of Session Equipment Utilized During Treatment: Left knee immobilizer Activity Tolerance: Patient tolerated treatment well Patient left: in bed;with call bell/phone within reach;with family/visitor present Nurse Communication: Mobility status   GP     Steven Patton [place], [place] [phone]  09/18/2012, 10:34 AM

## 2012-09-18 NOTE — Progress Notes (Signed)
   CARE MANAGEMENT NOTE 09/18/2012  Patient:  Steven Patton, Steven Patton   Account Number:  [id number]  Date Initiated:  09/17/2012  Documentation initiated by:  [facility]  Subjective/Objective Assessment:   Left total knee replacement     Action/Plan:   [facility] PT   Anticipated DC Date:  09/17/2012   Anticipated DC Plan:  HOME W HOME HEALTH SERVICES      DC Planning Services  CM consult      [facility] Choice  HOME HEALTH   Choice offered to / List presented to:  C-3 Spouse   DME arranged  3-N-1      DME agency  [facility]  [employer]     HH arranged  HH-2 PT      HH agency  [facility]   Status of service:  Completed, signed off Medicare Important Message given?   (If response is "NO", the following Medicare IM given date fields will be blank) Date Medicare IM given:   Date Additional Medicare IM given:    Discharge Disposition:  HOME W HOME HEALTH SERVICES  Per UR Regulation:    If discussed at Long Length of Stay Meetings, dates discussed:    Comments:  09-18-12 11:15 CM spoke with pt's wife who states pt has a rolling walker at home and only needs a 3n1. Christoper Allegra was faxed referral for 3n1 to be delivered to pt's home tomorrow, 09-19-12.  Pt's wife aware 3n1 will be delivered to thier home.   Choice of home health agencies were given and pt's wife requested [facility] and referral was made for services to begin tomorrow, 09-19-12. TnT to deliver CPM to home 09-18-12.   No other CM needs were communicated. Freddy Jaksch, BSN, CM [phone].

## 2012-09-19 ENCOUNTER — Encounter (HOSPITAL_COMMUNITY): Payer: Self-pay | Admitting: Orthopedic Surgery

## 2012-09-30 ENCOUNTER — Telehealth: Payer: Self-pay | Admitting: *Deleted

## 2012-09-30 DIAGNOSIS — M51369 Other intervertebral disc degeneration, lumbar region without mention of lumbar back pain or lower extremity pain: Secondary | ICD-10-CM

## 2012-09-30 DIAGNOSIS — M5136 Other intervertebral disc degeneration, lumbar region: Secondary | ICD-10-CM

## 2012-09-30 NOTE — Telephone Encounter (Signed)
Pt informed. I see a referral for his knee but not his back. Please advise.  Meyer Cory, LPN

## 2012-09-30 NOTE — Telephone Encounter (Signed)
The spine surgeon has access to the MRI.  Advice regarding oxycodone sounds good.

## 2012-09-30 NOTE — Telephone Encounter (Signed)
I will go ahead and place an official referral.

## 2012-09-30 NOTE — Telephone Encounter (Signed)
Pt has called stating that he is having extreme back pain. He has some oxycodone from Dr. Luiz Blare but he has only been taking 1 and rx states 1-2 q6hrs prn. Informed him to go ahead and try 2. He wants you to send the MRI to the back doctor you mentioned to him. Meyer Cory, LPN

## 2012-10-18 ENCOUNTER — Other Ambulatory Visit: Payer: Self-pay | Admitting: Orthopedic Surgery

## 2012-10-26 ENCOUNTER — Encounter (HOSPITAL_COMMUNITY): Payer: Self-pay | Admitting: Pharmacy Technician

## 2012-10-28 ENCOUNTER — Encounter (HOSPITAL_COMMUNITY)
Admission: RE | Admit: 2012-10-28 | Discharge: 2012-10-28 | Disposition: A | Discharge status: Home / Self Care | Payer: Medicare PPO | Source: Ambulatory Visit | Attending: Orthopedic Surgery | Admitting: Orthopedic Surgery

## 2012-10-28 ENCOUNTER — Encounter (HOSPITAL_COMMUNITY): Payer: Self-pay

## 2012-10-28 DIAGNOSIS — Z01812 Encounter for preprocedural laboratory examination: Secondary | ICD-10-CM | POA: Insufficient documentation

## 2012-10-28 DIAGNOSIS — Z01818 Encounter for other preprocedural examination: Secondary | ICD-10-CM | POA: Insufficient documentation

## 2012-10-28 LAB — CBC WITH DIFFERENTIAL/PLATELET
Eosinophils Absolute: 0.3 10*3/uL (ref 0.0–0.7)
HCT: 44.2 % (ref 39.0–52.0)
Hemoglobin: 15.8 g/dL (ref 13.0–17.0)
Lymphs Abs: 2.9 10*3/uL (ref 0.7–4.0)
MCH: 31.2 pg (ref 26.0–34.0)
MCV: 87.2 fL (ref 78.0–100.0)
Monocytes Absolute: 1.4 10*3/uL — ABNORMAL HIGH (ref 0.1–1.0)
Monocytes Relative: 11 % (ref 3–12)
Neutro Abs: 7.6 10*3/uL (ref 1.7–7.7)
Neutrophils Relative %: 62 % (ref 43–77)
RBC: 5.07 MIL/uL (ref 4.22–5.81)
RDW: 13.7 % (ref 11.5–15.5)

## 2012-10-28 LAB — URINALYSIS, ROUTINE W REFLEX MICROSCOPIC
Bilirubin Urine: NEGATIVE
Glucose, UA: NEGATIVE mg/dL
Hgb urine dipstick: NEGATIVE
Leukocytes, UA: NEGATIVE
Nitrite: NEGATIVE
Specific Gravity, Urine: 1.023 (ref 1.005–1.030)
Urobilinogen, UA: 0.2 mg/dL (ref 0.0–1.0)
pH: 6 (ref 5.0–8.0)

## 2012-10-28 LAB — COMPREHENSIVE METABOLIC PANEL
ALT: 13 U/L (ref 0–53)
Alkaline Phosphatase: 58 U/L (ref 39–117)
BUN: 18 mg/dL (ref 6–23)
Chloride: 99 mEq/L (ref 96–112)
Creatinine, Ser: 1.15 mg/dL (ref 0.50–1.35)
GFR calc Af Amer: 70 mL/min — ABNORMAL LOW (ref >90)
Glucose, Bld: 126 mg/dL — ABNORMAL HIGH (ref 70–99)
Potassium: 3.5 mEq/L (ref 3.5–5.1)
Total Bilirubin: 0.5 mg/dL (ref 0.3–1.2)

## 2012-10-28 LAB — PROTIME-INR: Prothrombin Time: 13 seconds (ref 11.6–15.2)

## 2012-10-28 LAB — SURGICAL PCR SCREEN
MRSA, PCR: NEGATIVE
Staphylococcus aureus: NEGATIVE

## 2012-10-28 LAB — TYPE AND SCREEN

## 2012-10-28 MED ORDER — POVIDONE-IODINE 7.5 % EX SOLN: Freq: Once | CUTANEOUS | Status: DC

## 2012-10-28 NOTE — Progress Notes (Addendum)
Took a Nitroglycerin a week only required one d/t angina-Allison notified

## 2012-10-28 NOTE — Progress Notes (Signed)
Cardiologist is Dr.Crenshaw with last visit 4months  Heart cath in epic from 2014  Echo and stress test done > 35yrs ago   Dr.Thekkekandam with [facility] Steven Patton is Medical MD   EKG in epic from 07-2012  CXR in epic from 06-2012

## 2012-10-28 NOTE — Pre-Procedure Instructions (Signed)
Steven Patton  10/28/2012   Your procedure is scheduled on:  Wed, Oct 1 @ 11:56 AM  Report to [facility]  2 * 3 at 9:00 AM.  Call this number if you have problems the morning of surgery: [phone]   Remember:   Do not eat food or drink liquids after midnight.   Take these medicines the morning of surgery with A SIP OF WATER: Amlodipine(Norvasc),Metoprolol(Lopressor),and Pain Pill(if needed)               Stop taking your Aspirin,Robaxin,and Fish Oil.No Goody's,BC's,Aleve,Ibuprofen,or Herbal Medications   Do not wear jewelry  Do not wear lotions, powders, or colognes. You may wear deodorant.  Men may shave face and neck.  Do not bring valuables to the hospital.  [facility] is not responsible                  for any belongings or valuables.               Contacts, dentures or bridgework may not be worn into surgery.  Leave suitcase in the car. After surgery it may be brought to your room.  For patients admitted to the hospital, discharge time is determined by your                treatment team.               Patients discharged the day of surgery will not be allowed to drive  home.    Special Instructions: Shower using CHG 2 nights before surgery and the night before surgery.  If you shower the day of surgery use CHG.  Use special wash - you have one bottle of CHG for all showers.  You should use approximately 1/3 of the bottle for each shower.   Please read over the following fact sheets that you were given: Pain Booklet, Coughing and Deep Breathing, MRSA Information and Surgical Site Infection Prevention

## 2012-10-28 NOTE — Progress Notes (Signed)
Anesthesia Chart Review:  Patient is a 77 year old male scheduled for L4-5, L5-S1 decompression on 11/02/12 by Dr. Yevette Edwards.  History includes obesity, former smoker, left eye macular degeneration, arthritis, OSA, HTN, cataracts, tonsillectomy, chest pain history with normal coronaries 06/10/12, left TKA on 09/16/12. PCP is Dr. [name].    Patient has had intermittent chest pain over the past three years.  He had another episode last week.  (Patient did not wait to speak with me at his PAT visit.)  He was ultimately referred to cardiologist Dr. Jens Som in May 2014.  Cardiac cath on 06/10/12 was normal, and no further cardiac work-up recommended.    Cardiac cath on 06/10/12 showed normal coronaries, EF 60%, no regional wall motion abnormalities.  EKG on 07/27/12  Showed SR, LAFB, RV conduction delay.  CXR on 06/08/12 showed no acute abnormalities.  Preoperative labs noted.  Patient had normal coronaries by cath less than six months ago and has since tolerated left TKA.  Unless he has new or progressive CV/CHF symptoms then I would anticipate that he could proceed as planned.  Velna Ochs [facility]/Anesthesiology Phone [phone] 10/28/2012 4:04 PM

## 2012-11-01 MED ORDER — DEXTROSE 5 % IV SOLN
3.0000 g | INTRAVENOUS | Status: AC
  Administered 2012-11-02: 3 g via INTRAVENOUS
  Filled 2012-11-01: qty 3000

## 2012-11-02 ENCOUNTER — Ambulatory Visit (HOSPITAL_COMMUNITY)
Admission: RE | Admit: 2012-11-02 | Discharge: 2012-11-02 | Disposition: A | Discharge status: Home / Self Care | Payer: Medicare PPO | Source: Ambulatory Visit | Attending: Orthopedic Surgery | Admitting: Orthopedic Surgery

## 2012-11-02 ENCOUNTER — Encounter (HOSPITAL_COMMUNITY): Payer: Self-pay | Admitting: Vascular Surgery

## 2012-11-02 ENCOUNTER — Ambulatory Visit (HOSPITAL_COMMUNITY): Payer: Medicare PPO

## 2012-11-02 ENCOUNTER — Ambulatory Visit (HOSPITAL_COMMUNITY): Payer: Medicare PPO | Admitting: Anesthesiology

## 2012-11-02 ENCOUNTER — Encounter (HOSPITAL_COMMUNITY)
Admission: RE | Disposition: A | Discharge status: Home / Self Care | Payer: Self-pay | Source: Ambulatory Visit | Attending: Orthopedic Surgery

## 2012-11-02 ENCOUNTER — Encounter (HOSPITAL_COMMUNITY): Payer: Self-pay | Admitting: Anesthesiology

## 2012-11-02 DIAGNOSIS — M48061 Spinal stenosis, lumbar region without neurogenic claudication: Secondary | ICD-10-CM | POA: Insufficient documentation

## 2012-11-02 DIAGNOSIS — I10 Essential (primary) hypertension: Secondary | ICD-10-CM | POA: Insufficient documentation

## 2012-11-02 DIAGNOSIS — Z79899 Other long term (current) drug therapy: Secondary | ICD-10-CM | POA: Insufficient documentation

## 2012-11-02 HISTORY — PX: LUMBAR LAMINECTOMY/DECOMPRESSION MICRODISCECTOMY: SHX5026

## 2012-11-02 SURGERY — LUMBAR LAMINECTOMY/DECOMPRESSION MICRODISCECTOMY
Anesthesia: General | Site: Back | Wound class: Clean

## 2012-11-02 MED ORDER — ONDANSETRON HCL 4 MG/2ML IJ SOLN
INTRAMUSCULAR | Status: DC | PRN
  Administered 2012-11-02: 4 mg via INTRAVENOUS

## 2012-11-02 MED ORDER — LIDOCAINE HCL (CARDIAC) 20 MG/ML IV SOLN
INTRAVENOUS | Status: DC | PRN
  Administered 2012-11-02: 100 mg via INTRAVENOUS

## 2012-11-02 MED ORDER — THROMBIN 20000 UNITS EX SOLR
CUTANEOUS | Status: DC | PRN
  Administered 2012-11-02: 13:00:00 via TOPICAL

## 2012-11-02 MED ORDER — HEMOSTATIC AGENTS (NO CHARGE) OPTIME
TOPICAL | Status: DC | PRN
  Administered 2012-11-02: 1 via TOPICAL

## 2012-11-02 MED ORDER — METHYLPREDNISOLONE ACETATE 40 MG/ML IJ SUSP
INTRAMUSCULAR | Status: DC | PRN
  Administered 2012-11-02: 40 mg

## 2012-11-02 MED ORDER — OXYCODONE HCL 5 MG PO TABS
ORAL_TABLET | ORAL | Status: AC
  Filled 2012-11-02: qty 1

## 2012-11-02 MED ORDER — THROMBIN 20000 UNITS EX SOLR
CUTANEOUS | Status: AC
  Filled 2012-11-02: qty 20000

## 2012-11-02 MED ORDER — PHENYLEPHRINE HCL 10 MG/ML IJ SOLN
INTRAMUSCULAR | Status: DC | PRN
  Administered 2012-11-02 (×5): 80 ug via INTRAVENOUS

## 2012-11-02 MED ORDER — METHYLENE BLUE 1 % INJ SOLN
INTRAMUSCULAR | Status: DC | PRN
  Administered 2012-11-02: 10 mL

## 2012-11-02 MED ORDER — LACTATED RINGERS IV SOLN
INTRAVENOUS | Status: DC | PRN
  Administered 2012-11-02 (×2): via INTRAVENOUS

## 2012-11-02 MED ORDER — PHENYLEPHRINE HCL 10 MG/ML IJ SOLN
10.0000 mg | INTRAVENOUS | Status: DC | PRN
  Administered 2012-11-02: 10 ug/min via INTRAVENOUS

## 2012-11-02 MED ORDER — ARTIFICIAL TEARS OP OINT
TOPICAL_OINTMENT | OPHTHALMIC | Status: DC | PRN
  Administered 2012-11-02: 1 via OPHTHALMIC

## 2012-11-02 MED ORDER — LACTATED RINGERS IV SOLN
INTRAVENOUS | Status: DC
  Administered 2012-11-02: 10:00:00 via INTRAVENOUS

## 2012-11-02 MED ORDER — FENTANYL CITRATE 0.05 MG/ML IJ SOLN
INTRAMUSCULAR | Status: DC | PRN
  Administered 2012-11-02 (×5): 50 ug via INTRAVENOUS

## 2012-11-02 MED ORDER — ROCURONIUM BROMIDE 100 MG/10ML IV SOLN
INTRAVENOUS | Status: DC | PRN
  Administered 2012-11-02: 10 mg via INTRAVENOUS
  Administered 2012-11-02: 50 mg via INTRAVENOUS

## 2012-11-02 MED ORDER — 0.9 % SODIUM CHLORIDE (POUR BTL) OPTIME
TOPICAL | Status: DC | PRN
  Administered 2012-11-02 (×3): 1000 mL

## 2012-11-02 MED ORDER — GLYCOPYRROLATE 0.2 MG/ML IJ SOLN
INTRAMUSCULAR | Status: DC | PRN
  Administered 2012-11-02: 0.4 mg via INTRAVENOUS

## 2012-11-02 MED ORDER — HYDROMORPHONE HCL PF 1 MG/ML IJ SOLN
INTRAMUSCULAR | Status: AC
  Filled 2012-11-02: qty 1

## 2012-11-02 MED ORDER — HYDROMORPHONE HCL PF 1 MG/ML IJ SOLN
0.2500 mg | INTRAMUSCULAR | Status: DC | PRN
  Administered 2012-11-02 (×2): 0.5 mg via INTRAVENOUS

## 2012-11-02 MED ORDER — OXYCODONE HCL 5 MG/5ML PO SOLN: 5.0000 mg | Freq: Once | ORAL | Status: AC | PRN

## 2012-11-02 MED ORDER — PROPOFOL 10 MG/ML IV BOLUS
INTRAVENOUS | Status: DC | PRN
  Administered 2012-11-02: 200 mg via INTRAVENOUS

## 2012-11-02 MED ORDER — METHYLENE BLUE 1 % INJ SOLN
INTRAMUSCULAR | Status: AC
  Filled 2012-11-02: qty 10

## 2012-11-02 MED ORDER — BUPIVACAINE-EPINEPHRINE PF 0.25-1:200000 % IJ SOLN
INTRAMUSCULAR | Status: AC
  Filled 2012-11-02: qty 30

## 2012-11-02 MED ORDER — BUPIVACAINE-EPINEPHRINE 0.25% -1:200000 IJ SOLN
INTRAMUSCULAR | Status: DC | PRN
  Administered 2012-11-02: 26 mL

## 2012-11-02 MED ORDER — METOCLOPRAMIDE HCL 5 MG/ML IJ SOLN: 10.0000 mg | Freq: Once | INTRAMUSCULAR | Status: DC | PRN

## 2012-11-02 MED ORDER — NEOSTIGMINE METHYLSULFATE 1 MG/ML IJ SOLN
INTRAMUSCULAR | Status: DC | PRN
  Administered 2012-11-02: 3 mg via INTRAVENOUS

## 2012-11-02 MED ORDER — OXYCODONE HCL 5 MG PO TABS
5.0000 mg | ORAL_TABLET | Freq: Once | ORAL | Status: AC | PRN
  Administered 2012-11-02: 5 mg via ORAL

## 2012-11-02 MED ORDER — METHYLPREDNISOLONE ACETATE 40 MG/ML IJ SUSP
INTRAMUSCULAR | Status: AC
  Filled 2012-11-02: qty 1

## 2012-11-02 MED ORDER — EPHEDRINE SULFATE 50 MG/ML IJ SOLN
INTRAMUSCULAR | Status: DC | PRN
  Administered 2012-11-02 (×3): 5 mg via INTRAVENOUS

## 2012-11-02 SURGICAL SUPPLY — 71 items
BENZOIN TINCTURE PRP APPL 2/3 (GAUZE/BANDAGES/DRESSINGS) ×2 IMPLANT
BUR ROUND PRECISION 4.0 (BURR) ×2 IMPLANT
CANISTER SUCTION 2500CC (MISCELLANEOUS) ×2 IMPLANT
CARTRIDGE OIL MAESTRO DRILL (MISCELLANEOUS) ×1 IMPLANT
CLOTH BEACON ORANGE TIMEOUT ST (SAFETY) ×2 IMPLANT
CLSR STERI-STRIP ANTIMIC 1/2X4 (GAUZE/BANDAGES/DRESSINGS) ×2 IMPLANT
CORDS BIPOLAR (ELECTRODE) ×2 IMPLANT
COVER SURGICAL LIGHT HANDLE (MISCELLANEOUS) ×2 IMPLANT
DIFFUSER DRILL AIR PNEUMATIC (MISCELLANEOUS) ×2 IMPLANT
DRAIN CHANNEL 15F RND FF W/TCR (WOUND CARE) IMPLANT
DRAPE POUCH INSTRU U-SHP 10X18 (DRAPES) ×4 IMPLANT
DRAPE SURG 17X23 STRL (DRAPES) ×6 IMPLANT
DURAPREP 26ML APPLICATOR (WOUND CARE) ×2 IMPLANT
ELECT BLADE 4.0 EZ CLEAN MEGAD (MISCELLANEOUS) ×2
ELECT CAUTERY BLADE 6.4 (BLADE) ×2 IMPLANT
ELECT REM PT RETURN 9FT ADLT (ELECTROSURGICAL) ×2
ELECTRODE BLDE 4.0 EZ CLN MEGD (MISCELLANEOUS) ×1 IMPLANT
ELECTRODE REM PT RTRN 9FT ADLT (ELECTROSURGICAL) ×1 IMPLANT
EVACUATOR SILICONE 100CC (DRAIN) IMPLANT
FILTER STRAW FLUID ASPIR (MISCELLANEOUS) ×2 IMPLANT
GAUZE SPONGE 4X4 16PLY XRAY LF (GAUZE/BANDAGES/DRESSINGS) ×2 IMPLANT
GLOVE BIO SURGEON STRL SZ7 (GLOVE) ×4 IMPLANT
GLOVE BIO SURGEON STRL SZ8 (GLOVE) ×2 IMPLANT
GLOVE BIOGEL PI IND STRL 7.0 (GLOVE) ×1 IMPLANT
GLOVE BIOGEL PI IND STRL 8 (GLOVE) ×1 IMPLANT
GLOVE BIOGEL PI INDICATOR 7.0 (GLOVE) ×1
GLOVE BIOGEL PI INDICATOR 8 (GLOVE) ×1
GLOVE BIOGEL PI ORTHO PRO 7.5 (GLOVE) ×1
GLOVE PI ORTHO PRO STRL 7.5 (GLOVE) ×1 IMPLANT
GLOVE SURG SS PI 7.5 STRL IVOR (GLOVE) ×4 IMPLANT
GOWN STRL NON-REIN LRG LVL3 (GOWN DISPOSABLE) ×2 IMPLANT
GOWN STRL REIN XL XLG (GOWN DISPOSABLE) ×4 IMPLANT
IV CATH 14GX2 1/4 (CATHETERS) ×2 IMPLANT
KIT BASIN OR (CUSTOM PROCEDURE TRAY) ×2 IMPLANT
KIT POSITION SURG JACKSON T1 (MISCELLANEOUS) ×2 IMPLANT
KIT ROOM TURNOVER OR (KITS) ×2 IMPLANT
NEEDLE 18GX1X1/2 (RX/OR ONLY) (NEEDLE) ×6 IMPLANT
NEEDLE 22X1 1/2 (OR ONLY) (NEEDLE) ×2 IMPLANT
NEEDLE HYPO 25GX1X1/2 BEV (NEEDLE) ×2 IMPLANT
NEEDLE SPNL 18GX3.5 QUINCKE PK (NEEDLE) ×4 IMPLANT
NS IRRIG 1000ML POUR BTL (IV SOLUTION) ×2 IMPLANT
OIL CARTRIDGE MAESTRO DRILL (MISCELLANEOUS) ×2
PACK LAMINECTOMY ORTHO (CUSTOM PROCEDURE TRAY) ×2 IMPLANT
PACK UNIVERSAL I (CUSTOM PROCEDURE TRAY) ×2 IMPLANT
PAD ARMBOARD 7.5X6 YLW CONV (MISCELLANEOUS) ×4 IMPLANT
PATTIES SURGICAL .5 X.5 (GAUZE/BANDAGES/DRESSINGS) IMPLANT
PATTIES SURGICAL .5 X1 (DISPOSABLE) ×2 IMPLANT
SPONGE GAUZE 4X4 12PLY (GAUZE/BANDAGES/DRESSINGS) ×2 IMPLANT
SPONGE INTESTINAL PEANUT (DISPOSABLE) ×2 IMPLANT
SPONGE SURGIFOAM ABS GEL 100 (HEMOSTASIS) ×2 IMPLANT
STRIP CLOSURE SKIN 1/2X4 (GAUZE/BANDAGES/DRESSINGS) IMPLANT
SURGIFLO TRUKIT (HEMOSTASIS) ×2 IMPLANT
SUT BONE WAX W31G (SUTURE) ×2 IMPLANT
SUT MNCRL AB 4-0 PS2 18 (SUTURE) ×2 IMPLANT
SUT VIC AB 0 CT1 18XCR BRD 8 (SUTURE) ×1 IMPLANT
SUT VIC AB 0 CT1 27 (SUTURE)
SUT VIC AB 0 CT1 27XBRD ANBCTR (SUTURE) IMPLANT
SUT VIC AB 0 CT1 8-18 (SUTURE) ×1
SUT VIC AB 1 CT1 18XCR BRD 8 (SUTURE) IMPLANT
SUT VIC AB 1 CT1 8-18 (SUTURE)
SUT VIC AB 2-0 CT2 18 VCP726D (SUTURE) ×2 IMPLANT
SYR 20CC LL (SYRINGE) IMPLANT
SYR BULB IRRIGATION 50ML (SYRINGE) ×2 IMPLANT
SYR CONTROL 10ML LL (SYRINGE) ×6 IMPLANT
SYR TB 1ML 26GX3/8 SAFETY (SYRINGE) ×4 IMPLANT
SYR TB 1ML LUER SLIP (SYRINGE) ×4 IMPLANT
TAPE CLOTH SURG 6X10 WHT LF (GAUZE/BANDAGES/DRESSINGS) ×2 IMPLANT
TOWEL OR 17X24 6PK STRL BLUE (TOWEL DISPOSABLE) ×2 IMPLANT
TOWEL OR 17X26 10 PK STRL BLUE (TOWEL DISPOSABLE) ×2 IMPLANT
WATER STERILE IRR 1000ML POUR (IV SOLUTION) IMPLANT
YANKAUER SUCT BULB TIP NO VENT (SUCTIONS) ×2 IMPLANT

## 2012-11-02 NOTE — Anesthesia Postprocedure Evaluation (Signed)
Anesthesia Post Note  Patient: Steven Patton  Procedure(s) Performed: Procedure(s) (LRB): LUMBAR LAMINECTOMY/DECOMPRESSION MICRODISCECTOMY 2 LEVEL (N/A)  Anesthesia type: General  Patient location: PACU  Post pain: Pain level controlled  Post assessment: Patient's Cardiovascular Status Stable  Last Vitals:  Filed Vitals:   11/02/12 1715  BP:   Pulse: 78  Temp:   Resp: 17    Post vital signs: Reviewed and stable  Level of consciousness: alert  Complications: No apparent anesthesia complications

## 2012-11-02 NOTE — Anesthesia Procedure Notes (Addendum)
Procedure Name: Intubation Date/Time: 11/02/2012 11:33 AM Performed by: Lovie Chol Pre-anesthesia Checklist: Patient identified, Emergency Drugs available, Suction available, Patient being monitored and Timeout performed Patient Re-evaluated:Patient Re-evaluated prior to inductionOxygen Delivery Method: Circle system utilized Preoxygenation: Pre-oxygenation with 100% oxygen Ventilation: Mask ventilation without difficulty and Oral airway inserted - appropriate to patient size Laryngoscope Size: Miller and 3 Grade View: Grade I Tube type: Oral Tube size: 7.5 mm Number of attempts: 1 Airway Equipment and Method: Stylet Placement Confirmation: ETT inserted through vocal cords under direct vision,  positive ETCO2,  CO2 detector and breath sounds checked- equal and bilateral Secured at: 23 cm Tube secured with: Tape Dental Injury: Teeth and Oropharynx as per pre-operative assessment

## 2012-11-02 NOTE — Transfer of Care (Signed)
Immediate Anesthesia Transfer of Care Note  Patient: Steven Patton  Procedure(s) Performed: Procedure(s) with comments: LUMBAR LAMINECTOMY/DECOMPRESSION MICRODISCECTOMY 2 LEVEL (N/A) - Lumbar 4-5,lumbar 5-sacrum 1 decompression  Patient Location: PACU  Anesthesia Type:General  Level of Consciousness: awake and patient cooperative  Airway & Oxygen Therapy: Patient Spontanous Breathing and Patient connected to face mask oxygen  Post-op Assessment: Report given to PACU RN and Post -op Vital signs reviewed and stable  Post vital signs: Reviewed  Complications: No apparent anesthesia complications

## 2012-11-02 NOTE — Preoperative (Signed)
Beta Blockers   Reason not to administer Beta Blockers:Not Applicable 

## 2012-11-02 NOTE — H&P (Signed)
PREOPERATIVE H&P  Chief Complaint: left leg pain  HPI: Steven Patton is a 77 y.o. male who presents with ongoing left leg pain. MRI = stenosis L4-S1. Patient failed conservative care. Continues to have pain.  Past Medical History  Diagnosis Date  . Cataract   . Macular degeneration, left eye   . Arthritis   . Sleep apnea     2008.Marland KitchenMarland KitchenWEARS CPAP  . Hypertension     takes Amlodipine,Lisinopril,and Metoprolol daily   Past Surgical History  Procedure Laterality Date  . Knee surgery    . Cardiac catheterization  06-10-12  . Eye surgery      CATARACT RIGHT EYE  . Tonsillectomy    . Total knee arthroplasty Left 09/16/2012    Procedure: TOTAL KNEE ARTHROPLASTY;  Surgeon: Harvie Junior, MD;  Location: [facility] OR;  Service: Orthopedics;  Laterality: Left;   History   Social History  . Marital Status: Married    Spouse Name: N/A    Number of Children: 5  . Years of Education: N/A   Social History Main Topics  . Smoking status: Former Games developer  . Smokeless tobacco: Not on file     Comment: quit 3yrs ago  . Alcohol Use: No  . Drug Use: No  . Sexual Activity: Not Currently    Birth Control/ Protection: None   Other Topics Concern  . Not on file   Social History Narrative  . No narrative on file   Family History  Problem Relation Age of Onset  . Cancer Daughter    No Known Allergies Prior to Admission medications   Medication Sig Start Date End Date Taking? Authorizing Provider  amLODipine (NORVASC) 10 MG tablet Take 10 mg by mouth daily.   Yes Historical Provider, MD  aspirin EC 81 MG tablet Take 81 mg by mouth daily.   Yes Historical Provider, MD  lisinopril-hydrochlorothiazide (PRINZIDE,ZESTORETIC) 20-25 MG per tablet Take 1 tablet by mouth daily. 01/22/12  Yes Monica Becton, MD  methocarbamol (ROBAXIN) 750 MG tablet Take 750 mg by mouth 3 (three) times daily as needed (for muscle spasms).    Yes Historical Provider, MD  metoprolol (LOPRESSOR) 50 MG tablet Take 50 mg by  mouth 2 (two) times daily.   Yes Historical Provider, MD  nitroGLYCERIN (NITROSTAT) 0.4 MG SL tablet Place 0.4 mg under the tongue every 5 (five) minutes as needed for chest pain. 02/26/12 02/25/13 Yes Monica Becton, MD  Omega-3 Fatty Acids (FISH OIL PO) Take 1 capsule by mouth daily.    Yes Historical Provider, MD  oxyCODONE-acetaminophen (PERCOCET/ROXICET) 5-325 MG per tablet Take 1-2 tablets by mouth every 6 (six) hours as needed for pain. 09/16/12  Yes Matthew Folks, PA-C  AMBULATORY NON FORMULARY MEDICATION [employer] CPAP Machine, needs humidifier to fit CPAP machine. 01/18/12   Monica Becton, MD  AMBULATORY NON FORMULARY MEDICATION Shower chair. 08/15/12   Monica Becton, MD  AMBULATORY NON FORMULARY MEDICATION Reclining lift chair. 09/15/12   Monica Becton, MD     All other systems have been reviewed and were otherwise negative with the exception of those mentioned in the HPI and as above.  Physical Exam: There were no vitals filed for this visit.  General: Alert, no acute distress Cardiovascular: No pedal edema Respiratory: No cyanosis, no use of accessory musculature Skin: No lesions in the area of chief complaint Neurologic: Sensation intact distally Psychiatric: Patient is competent for consent with normal mood and affect Lymphatic: No axillary or cervical lymphadenopathy  MUSCULOSKELETAL: + SLR on left  Assessment/Plan: Left leg pain Plan for Procedure(s): LUMBAR LAMINECTOMY/DECOMPRESSION L4-1   Emilee Hero, MD 11/02/2012 8:18 AM

## 2012-11-02 NOTE — Anesthesia Preprocedure Evaluation (Addendum)
Anesthesia Evaluation  Patient identified by MRN, date of birth, ID band Patient awake    Reviewed: Allergy & Precautions, H&P , NPO status , Patient's Chart, lab work & pertinent test results, reviewed documented beta blocker date and time   History of Anesthesia Complications Negative for: history of anesthetic complications  Airway Mallampati: II TM Distance: >3 FB Neck ROM: full    Dental  (+) Edentulous Upper, Edentulous Lower and Dental Advisory Given   Pulmonary sleep apnea and Continuous Positive Airway Pressure Ventilation ,  breath sounds clear to auscultation        Cardiovascular hypertension, On Medications and On Home Beta Blockers Rhythm:regular     Neuro/Psych negative neurological ROS  negative psych ROS   GI/Hepatic negative GI ROS, Neg liver ROS,   Endo/Other  negative endocrine ROS  Renal/GU negative Renal ROS  negative genitourinary   Musculoskeletal   Abdominal   Peds  Hematology negative hematology ROS (+)   Anesthesia Other Findings See surgeon's H&P   Reproductive/Obstetrics negative OB ROS                          Anesthesia Physical Anesthesia Plan  ASA: III  Anesthesia Plan: General   Post-op Pain Management:    Induction: Intravenous  Airway Management Planned: Oral ETT  Additional Equipment:   Intra-op Plan:   Post-operative Plan: Extubation in OR  Informed Consent: I have reviewed the patients History and Physical, chart, labs and discussed the procedure including the risks, benefits and alternatives for the proposed anesthesia with the patient or authorized representative who has indicated his/her understanding and acceptance.   Dental Advisory Given  Plan Discussed with: CRNA and Surgeon  Anesthesia Plan Comments:         Anesthesia Quick Evaluation

## 2012-11-03 NOTE — Op Note (Signed)
NAMEBONNER, Steven NO.:  [id number]  MEDICAL RECORD NO.:  [id number]  LOCATION:  [facility]                         FACILITY:  [facility]  PHYSICIAN:  Estill Bamberg, MD      DATE OF BIRTH:  [birth year]  DATE OF PROCEDURE:  11/02/2012 DATE OF DISCHARGE:  11/02/2012                              OPERATIVE REPORT   PREOPERATIVE DIAGNOSES: 1. L4-5, L5-S1 spinal stenosis. 2. Severe and debilitating left leg pain.  POSTOPERATIVE DIAGNOSES: 1. L4-5, L5-S1 spinal stenosis. 2. Severe and debilitating left leg pain.  PROCEDURE:  L4-5, L5-S1 laminectomy with bilateral partial facetectomy and bilateral foraminotomy.  Upon admission, it was L4-5, L5-S1 laminectomy, partial facetectomy and bilateral foraminotomy.  SURGEON:  Estill Bamberg, MD  ASSISTANT:  Eilene Ghazi. McKenzie, PA-C  ANESTHESIA:  General endotracheal anesthesia.  COMPLICATIONS:  None.  DISPOSITION:  Stable.  ESTIMATED BLOOD LOSS:  200 mL.  INDICATIONS FOR PROCEDURE:  Briefly, Steven Patton is a pleasant 77 year old male, who did present to me on October 14, 2012, with severe and debilitating pain in his left leg.  Of note, the patient is 5 months status post a Patton replacement.  The patient states that since his surgery, he did have severe pain in the left leg.  The patient was then referred to me at the request of Dr. [name] for management of his symptoms.  Of note, I did review an MRI which was notable for severe and profound spinal stenosis associated with the L4-5 and L5-S1.  Given the severity of his pain, we did discuss operative intervention.  Of note, the patient did have epidural injections, and did state that after each of this 2 epidural injections, he did get temporary improvement in his symptoms.  Also of note, on the patient's lumbar MRI, there was noted to be facet hypertrophy at the T11/T12 level.  This was causing spinal stenosis and the flexion of the spinal cord.  However, the patient  had no symptoms associated with myelopathy.  Also, the patient's primary pain generator was clearly related to the L4-S1 level in the lumbar spine.  We therefore did make a decision to proceed with operative intervention with regards to the lumbar spine.  I did explain to the patient that additional workup and treatment may be needed for the finding and his lower thoracic spine.  The patient did fully understand the risks and limitations of the procedure as outlined in my preoperative note.  OPERATIVE DETAILS:  On November 02, 2012, the patient was brought to Surgery and general endotracheal anesthesia was administered.  The patient was placed prone on a well-padded flat Jackson bed with a spinal frame.  Antibiotics were given and a time-out procedure was performed. The back was then prepped and draped in the usual sterile fashion.  I made a midline incision overlying the L4-5 and L5-S1 interspaces.  The fascia was incised at the midline and the paraspinal musculature was bluntly swept bilaterally.  The lamina of L4-L5, and S1 was subperiosteally exposed.  I did obtain a lateral intraoperative radiograph, to confirm the appropriate operative level.  At this point, I did remove the spinous processes of L4 and L5.  I  then went forward with a central decompression, starting at the L5-S1 level, working at the L4-5.  I then went forward with a bilateral partial facetectomy, first on the left and then on the right.  Of note, the L4-5 level was noted to be extremely stenotic.  However, I was able to remove all facet hypertrophy and a adequate and thorough decompression of the lateral recesses was accomplished on both the right and the left sides.  The neural foramina were also evaluated and decompressed appropriately.  Of note, there was a prominent and significant facet hypertrophy clearly identified at L4-5 on the left, as well L5-S1.  Again, the hypertrophy was removed and an appropriate  decompression was accomplished.  I then copiously irrigated the wound.  I then introduced 40 mg of Depo-Medrol about the epidural space.  I then explored the wound for any undue bleeding and there was none.  There was no extravasation of cerebrospinal fluid encountered throughout the entirety of the surgery. At this point, the fascia was closed using #1 Vicryl.  The subcutaneous layer was then closed using 2-0 Vicryl, and the skin was then closed using 3-0 Monocryl.  Benzoin and Steri-Strips were applied, followed by a sterile dressing.  All instrument counts were correct at the termination of the procedure.  Of note, Jason Coop, was my assistant throughout the entirety of the procedure, and did aid in essential retraction and suctioning needed throughout the surgery.     Estill Bamberg, MD     MD/MEDQ  D:  11/02/2012  T:  11/03/2012  Job:  [id number]  cc:   Monica Becton, MD

## 2012-11-04 ENCOUNTER — Encounter (HOSPITAL_COMMUNITY): Payer: Self-pay | Admitting: Orthopedic Surgery

## 2012-11-15 ENCOUNTER — Ambulatory Visit (INDEPENDENT_AMBULATORY_CARE_PROVIDER_SITE_OTHER): Payer: Medicare PPO | Admitting: Sports Medicine

## 2012-11-15 ENCOUNTER — Encounter: Payer: Self-pay | Admitting: Sports Medicine

## 2012-11-15 VITALS — BP 100/64 | HR 73 | Wt 266.0 lb

## 2012-11-15 DIAGNOSIS — M1710 Unilateral primary osteoarthritis, unspecified knee: Secondary | ICD-10-CM

## 2012-11-15 DIAGNOSIS — IMO0002 Reserved for concepts with insufficient information to code with codable children: Secondary | ICD-10-CM

## 2012-11-15 DIAGNOSIS — I10 Essential (primary) hypertension: Secondary | ICD-10-CM

## 2012-11-15 DIAGNOSIS — Z23 Encounter for immunization: Secondary | ICD-10-CM

## 2012-11-15 DIAGNOSIS — M169 Osteoarthritis of hip, unspecified: Secondary | ICD-10-CM

## 2012-11-15 DIAGNOSIS — E785 Hyperlipidemia, unspecified: Secondary | ICD-10-CM

## 2012-11-15 DIAGNOSIS — M1610 Unilateral primary osteoarthritis, unspecified hip: Secondary | ICD-10-CM

## 2012-11-15 DIAGNOSIS — M48062 Spinal stenosis, lumbar region with neurogenic claudication: Secondary | ICD-10-CM

## 2012-11-15 DIAGNOSIS — M1612 Unilateral primary osteoarthritis, left hip: Secondary | ICD-10-CM

## 2012-11-15 DIAGNOSIS — M179 Osteoarthritis of knee, unspecified: Secondary | ICD-10-CM

## 2012-11-15 MED ORDER — TIZANIDINE HCL 4 MG PO TABS: 4.0000 mg | ORAL_TABLET | Freq: Every evening | ORAL | Status: DC

## 2012-11-15 NOTE — Assessment & Plan Note (Signed)
Well controlled, no changes 

## 2012-11-15 NOTE — Assessment & Plan Note (Signed)
Lipids are well controlled, no changes. 

## 2012-11-15 NOTE — Assessment & Plan Note (Signed)
He is starting to have pain again after his injection. He does want sedative before redoing her injection, he will call me when he is ready, and we can do 10 mg of Valium prior to the injection.

## 2012-11-15 NOTE — Progress Notes (Signed)
  Subjective:    CC: Followup  HPI: Hyperlipidemia: Well controlled.  Hypertension: Well controlled.  Knee osteoarthritis:  Status post total knee arthroplasty of the left side by Dr. Luiz Blare, doing well.  Lumbar spinal stenosis: Status post 2 level discectomy with fusion by Dr. Yevette Edwards, still has a little pain behind his left thigh, has stopped his gabapentin.  Hip osteoarthritis: He is starting to have some pain several months after his femoroacetabular joint injection. Not yet ready for injection.  Past medical history, Surgical history, Family history not pertinant except as noted below, Social history, Allergies, and medications have been entered into the medical record, reviewed, and no changes needed.   Review of Systems: No fevers, chills, night sweats, weight loss, chest pain, or shortness of breath.   Objective:    General: Well Developed, well nourished, and in no acute distress.  Neuro: Alert and oriented x3, extra-ocular muscles intact, sensation grossly intact.  HEENT: Normocephalic, atraumatic, pupils equal round reactive to light, neck supple, no masses, no lymphadenopathy, thyroid nonpalpable.  Skin: Warm and dry, no rashes. Cardiac: Regular rate and rhythm, no murmurs rubs or gallops, no lower extremity edema.  Respiratory: Clear to auscultation bilaterally. Not using accessory muscles, speaking in full sentences.  Impression and Recommendations:

## 2012-11-15 NOTE — Assessment & Plan Note (Signed)
Status post total knee arthroplasty on the left side, doing well.

## 2012-11-15 NOTE — Assessment & Plan Note (Signed)
Most recently status post two-level discectomy with fusion by Dr. Yevette Edwards. Doing well, still has some left-sided radicular symptoms, he will restart his gabapentin, he would also like some Zanaflex

## 2012-11-22 ENCOUNTER — Encounter (INDEPENDENT_AMBULATORY_CARE_PROVIDER_SITE_OTHER): Payer: Medicare PPO | Admitting: Sports Medicine

## 2012-11-22 ENCOUNTER — Encounter: Payer: Self-pay | Admitting: Sports Medicine

## 2012-11-23 ENCOUNTER — Telehealth: Payer: Self-pay | Admitting: *Deleted

## 2012-11-23 ENCOUNTER — Ambulatory Visit (INDEPENDENT_AMBULATORY_CARE_PROVIDER_SITE_OTHER): Payer: Medicare PPO | Admitting: Sports Medicine

## 2012-11-23 VITALS — BP 96/61 | HR 58 | Wt 267.0 lb

## 2012-11-23 DIAGNOSIS — M169 Osteoarthritis of hip, unspecified: Secondary | ICD-10-CM

## 2012-11-23 DIAGNOSIS — M1610 Unilateral primary osteoarthritis, unspecified hip: Secondary | ICD-10-CM

## 2012-11-23 DIAGNOSIS — M1612 Unilateral primary osteoarthritis, left hip: Secondary | ICD-10-CM

## 2012-11-23 MED ORDER — GABAPENTIN 300 MG PO CAPS: 600.0000 mg | ORAL_CAPSULE | Freq: Two times a day (BID) | ORAL | Status: DC

## 2012-11-23 NOTE — Assessment & Plan Note (Signed)
Injection as above. Return in one month. 

## 2012-11-23 NOTE — Progress Notes (Signed)
  Procedure: Real-time Ultrasound Guided Injection of left femoral acetabular joint Device: GE Logiq E  Verbal informed consent obtained.  Time-out conducted.  Noted no overlying erythema, induration, or other signs of local infection.  Skin prepped in a sterile fashion.  Local anesthesia: Topical Ethyl chloride.  With sterile technique and under real time ultrasound guidance:  Spinal needle advanced to the femoral head/neck junction, 2 cc Kenalog 40, 4 cc lidocaine injected easily. Completed without difficulty  Pain immediately resolved suggesting accurate placement of the medication.  Advised to call if fevers/chills, erythema, induration, drainage, or persistent bleeding.  Images permanently stored and available for review in the ultrasound unit.  Impression: Technically successful ultrasound guided injection.

## 2012-11-23 NOTE — Progress Notes (Signed)
This encounter was created in error - please disregard.

## 2012-11-23 NOTE — Telephone Encounter (Signed)
Pt is asking for refill on Gabapentin. May I refill?  Meyer Cory, LPN

## 2012-11-23 NOTE — Telephone Encounter (Signed)
Yes

## 2012-11-24 ENCOUNTER — Ambulatory Visit: Payer: Medicare PPO | Admitting: Sports Medicine

## 2012-11-29 ENCOUNTER — Other Ambulatory Visit: Payer: Self-pay | Admitting: Sports Medicine

## 2012-11-29 MED ORDER — GABAPENTIN 600 MG PO TABS: ORAL_TABLET | ORAL | Status: DC

## 2012-12-20 ENCOUNTER — Other Ambulatory Visit: Payer: Self-pay | Admitting: Orthopedic Surgery

## 2012-12-21 ENCOUNTER — Ambulatory Visit: Payer: Medicare PPO | Admitting: Sports Medicine

## 2012-12-22 ENCOUNTER — Ambulatory Visit (INDEPENDENT_AMBULATORY_CARE_PROVIDER_SITE_OTHER): Payer: Medicare PPO | Admitting: Sports Medicine

## 2012-12-22 ENCOUNTER — Encounter: Payer: Self-pay | Admitting: Sports Medicine

## 2012-12-22 VITALS — BP 117/75 | HR 76 | Wt 271.0 lb

## 2012-12-22 DIAGNOSIS — M169 Osteoarthritis of hip, unspecified: Secondary | ICD-10-CM

## 2012-12-22 DIAGNOSIS — M179 Osteoarthritis of knee, unspecified: Secondary | ICD-10-CM

## 2012-12-22 DIAGNOSIS — M1610 Unilateral primary osteoarthritis, unspecified hip: Secondary | ICD-10-CM

## 2012-12-22 DIAGNOSIS — IMO0002 Reserved for concepts with insufficient information to code with codable children: Secondary | ICD-10-CM

## 2012-12-22 DIAGNOSIS — M1612 Unilateral primary osteoarthritis, left hip: Secondary | ICD-10-CM

## 2012-12-22 DIAGNOSIS — M1710 Unilateral primary osteoarthritis, unspecified knee: Secondary | ICD-10-CM

## 2012-12-22 DIAGNOSIS — M48062 Spinal stenosis, lumbar region with neurogenic claudication: Secondary | ICD-10-CM

## 2012-12-22 MED ORDER — OXYCODONE-ACETAMINOPHEN 10-325 MG PO TABS: 1.0000 | ORAL_TABLET | Freq: Three times a day (TID) | ORAL | Status: DC | PRN

## 2012-12-22 NOTE — Assessment & Plan Note (Signed)
Doing well after arthroplasty, return as needed, management per Dr. Luiz Blare.

## 2012-12-22 NOTE — Progress Notes (Signed)
  Subjective:    CC: Follow up  HPI: Left hip osteoarthritis:  Insufficient response to last injection, did see Dr. Luiz Blare, he has his left total hip arthroplasty planned for next month.  Left knee arthroplasty: Doing extremely well.  Lumbar degenerative disc disease: Lumbar spinal stenosis with neurogenic claudication cold doing well status post operative intervention.  Past medical history, Surgical history, Family history not pertinant except as noted below, Social history, Allergies, and medications have been entered into the medical record, reviewed, and no changes needed.   Review of Systems: No fevers, chills, night sweats, weight loss, chest pain, or shortness of breath.   Objective:    General: Well Developed, well nourished, and in no acute distress.  Neuro: Alert and oriented x3, extra-ocular muscles intact, sensation grossly intact.  HEENT: Normocephalic, atraumatic, pupils equal round reactive to light, neck supple, no masses, no lymphadenopathy, thyroid nonpalpable.  Skin: Warm and dry, no rashes. Cardiac: Regular rate and rhythm, no murmurs rubs or gallops, no lower extremity edema.  Respiratory: Clear to auscultation bilaterally. Not using accessory muscles, speaking in full sentences.  Impression and Recommendations:

## 2012-12-22 NOTE — Assessment & Plan Note (Signed)
Scheduled for left total hip arthroplasty.

## 2012-12-22 NOTE — Assessment & Plan Note (Signed)
Doing extremely well after surgery, further management per Dr. Yevette Edwards.

## 2013-01-04 ENCOUNTER — Encounter (HOSPITAL_COMMUNITY): Payer: Self-pay

## 2013-01-04 ENCOUNTER — Encounter (HOSPITAL_COMMUNITY)
Admission: RE | Admit: 2013-01-04 | Discharge: 2013-01-04 | Disposition: A | Discharge status: Home / Self Care | Payer: Medicare PPO | Source: Ambulatory Visit | Attending: Orthopedic Surgery | Admitting: Orthopedic Surgery

## 2013-01-04 LAB — URINALYSIS, ROUTINE W REFLEX MICROSCOPIC
Leukocytes, UA: NEGATIVE
Nitrite: NEGATIVE
Protein, ur: NEGATIVE mg/dL
Specific Gravity, Urine: 1.02 (ref 1.005–1.030)
Urobilinogen, UA: 0.2 mg/dL (ref 0.0–1.0)

## 2013-01-04 LAB — TYPE AND SCREEN
ABO/RH(D): A POS
Antibody Screen: NEGATIVE

## 2013-01-04 LAB — CBC WITH DIFFERENTIAL/PLATELET
Basophils Absolute: 0.1 10*3/uL (ref 0.0–0.1)
Basophils Relative: 1 % (ref 0–1)
Eosinophils Absolute: 0.3 10*3/uL (ref 0.0–0.7)
Eosinophils Relative: 3 % (ref 0–5)
Lymphocytes Relative: 23 % (ref 12–46)
MCH: 29.9 pg (ref 26.0–34.0)
MCV: 88.2 fL (ref 78.0–100.0)
Monocytes Absolute: 1.6 10*3/uL — ABNORMAL HIGH (ref 0.1–1.0)
Platelets: 160 10*3/uL (ref 150–400)
RBC: 5.08 MIL/uL (ref 4.22–5.81)
RDW: 13.5 % (ref 11.5–15.5)
WBC: 11 10*3/uL — ABNORMAL HIGH (ref 4.0–10.5)

## 2013-01-04 LAB — COMPREHENSIVE METABOLIC PANEL
ALT: 17 U/L (ref 0–53)
AST: 19 U/L (ref 0–37)
Albumin: 4 g/dL (ref 3.5–5.2)
Calcium: 9.7 mg/dL (ref 8.4–10.5)
Sodium: 141 mEq/L (ref 135–145)
Total Protein: 7.5 g/dL (ref 6.0–8.3)

## 2013-01-04 LAB — SURGICAL PCR SCREEN
MRSA, PCR: NEGATIVE
Staphylococcus aureus: NEGATIVE

## 2013-01-04 NOTE — Pre-Procedure Instructions (Signed)
Steven Patton  01/04/2013   Your procedure is scheduled on:  Friday December 5 th at 0730 AM  Report to [facility] Short Stay Main Entrance "A" at 0530 AM.  Call this number if you have problems the morning of surgery: [phone]   Remember:   Do not eat food or drink liquids after midnight Thursday 01/05/13   Take these medicines the morning of surgery with A SIP OF WATER: Amlodipine,Gabapentin, Metoprolol and Oxycodone-acetaminophen if needed for pain. Stop Aspirin, Fish oil , Nsaids, and herbal medication today  Do not wear jewelry.  Do not wear lotions, powders, or perfumes. You may wear deodorant.   Men may shave face and neck.  Do not bring valuables to the hospital.  [facility] is not responsible for any belongings or valuables.               Contacts, dentures or bridgework may not be worn into surgery.  Leave suitcase in the car. After surgery it may be brought to your room.  For patients admitted to the hospital, discharge time is determined by your treatment team.               Patients discharged the day of surgery will not be allowed to drive home.    Special Instructions: Incentive Spirometry - Practice and bring it with you on the day of surgery. Shower using CHG 2 nights before surgery and the night before surgery.  If you shower the day of surgery use CHG.  Use special wash - you have one bottle of CHG for all showers.  You should use approximately 1/3 of the bottle for each shower.   Please read over the following fact sheets that you were given: Pain Booklet, Coughing and Deep Breathing, Blood Transfusion Information, MRSA Information and Surgical Site Infection Prevention

## 2013-01-05 ENCOUNTER — Encounter (HOSPITAL_COMMUNITY): Payer: Self-pay | Admitting: Pharmacy Technician

## 2013-01-05 MED ORDER — CEFAZOLIN SODIUM-DEXTROSE 2-3 GM-% IV SOLR
2.0000 g | INTRAVENOUS | Status: DC
  Filled 2013-01-05: qty 50

## 2013-01-05 NOTE — Progress Notes (Addendum)
Called pt to ask about sleep study states that it was done in 2008.  He doesn't remember where it was done, just that it was in [place].  Message left with Dr Lucienne Minks office to see if they have any info about where the sleep study was preformed.

## 2013-01-06 ENCOUNTER — Inpatient Hospital Stay (HOSPITAL_COMMUNITY)
Admission: RE | Admit: 2013-01-06 | Discharge: 2013-01-08 | DRG: 470 | Disposition: A | Discharge status: Home / Self Care | Payer: Medicare PPO | Source: Ambulatory Visit | Attending: Orthopedic Surgery | Admitting: Orthopedic Surgery

## 2013-01-06 ENCOUNTER — Encounter (HOSPITAL_COMMUNITY): Payer: Medicare PPO | Admitting: Anesthesiology

## 2013-01-06 ENCOUNTER — Encounter (HOSPITAL_COMMUNITY)
Admission: RE | Disposition: A | Discharge status: Home / Self Care | Payer: Self-pay | Source: Ambulatory Visit | Attending: Orthopedic Surgery

## 2013-01-06 ENCOUNTER — Encounter (HOSPITAL_COMMUNITY): Payer: Self-pay | Admitting: *Deleted

## 2013-01-06 ENCOUNTER — Inpatient Hospital Stay (HOSPITAL_COMMUNITY): Payer: Medicare PPO

## 2013-01-06 ENCOUNTER — Inpatient Hospital Stay (HOSPITAL_COMMUNITY): Payer: Medicare PPO | Admitting: Anesthesiology

## 2013-01-06 DIAGNOSIS — M48062 Spinal stenosis, lumbar region with neurogenic claudication: Secondary | ICD-10-CM | POA: Diagnosis present

## 2013-01-06 DIAGNOSIS — E669 Obesity, unspecified: Secondary | ICD-10-CM | POA: Diagnosis present

## 2013-01-06 DIAGNOSIS — M1710 Unilateral primary osteoarthritis, unspecified knee: Secondary | ICD-10-CM | POA: Diagnosis present

## 2013-01-06 DIAGNOSIS — E785 Hyperlipidemia, unspecified: Secondary | ICD-10-CM | POA: Diagnosis present

## 2013-01-06 DIAGNOSIS — Z809 Family history of malignant neoplasm, unspecified: Secondary | ICD-10-CM

## 2013-01-06 DIAGNOSIS — Z96659 Presence of unspecified artificial knee joint: Secondary | ICD-10-CM

## 2013-01-06 DIAGNOSIS — G4730 Sleep apnea, unspecified: Secondary | ICD-10-CM | POA: Diagnosis present

## 2013-01-06 DIAGNOSIS — Z87891 Personal history of nicotine dependence: Secondary | ICD-10-CM

## 2013-01-06 DIAGNOSIS — M1612 Unilateral primary osteoarthritis, left hip: Secondary | ICD-10-CM

## 2013-01-06 DIAGNOSIS — I10 Essential (primary) hypertension: Secondary | ICD-10-CM | POA: Diagnosis present

## 2013-01-06 DIAGNOSIS — M1610 Unilateral primary osteoarthritis, unspecified hip: Principal | ICD-10-CM | POA: Diagnosis present

## 2013-01-06 DIAGNOSIS — Z79899 Other long term (current) drug therapy: Secondary | ICD-10-CM

## 2013-01-06 DIAGNOSIS — IMO0002 Reserved for concepts with insufficient information to code with codable children: Secondary | ICD-10-CM | POA: Diagnosis present

## 2013-01-06 DIAGNOSIS — Z96649 Presence of unspecified artificial hip joint: Secondary | ICD-10-CM | POA: Diagnosis present

## 2013-01-06 DIAGNOSIS — M169 Osteoarthritis of hip, unspecified: Principal | ICD-10-CM | POA: Diagnosis present

## 2013-01-06 HISTORY — PX: TOTAL HIP ARTHROPLASTY: SHX124

## 2013-01-06 LAB — POCT I-STAT 4, (NA,K, GLUC, HGB,HCT)
Glucose, Bld: 114 mg/dL — ABNORMAL HIGH (ref 70–99)
HCT: 34 % — ABNORMAL LOW (ref 39.0–52.0)
Hemoglobin: 11.6 g/dL — ABNORMAL LOW (ref 13.0–17.0)
Potassium: 3.3 mEq/L — ABNORMAL LOW (ref 3.5–5.1)
Sodium: 141 mEq/L (ref 135–145)

## 2013-01-06 SURGERY — ARTHROPLASTY, HIP, TOTAL,POSTERIOR APPROACH
Anesthesia: General | Site: Hip | Laterality: Left

## 2013-01-06 MED ORDER — LISINOPRIL 20 MG PO TABS
20.0000 mg | ORAL_TABLET | Freq: Every day | ORAL | Status: DC
  Administered 2013-01-07 – 2013-01-08 (×2): 20 mg via ORAL
  Filled 2013-01-06 (×2): qty 1

## 2013-01-06 MED ORDER — LIDOCAINE HCL (CARDIAC) 20 MG/ML IV SOLN
INTRAVENOUS | Status: DC | PRN
  Administered 2013-01-06: 100 mg via INTRAVENOUS

## 2013-01-06 MED ORDER — PROMETHAZINE HCL 25 MG/ML IJ SOLN: 6.2500 mg | INTRAMUSCULAR | Status: DC | PRN

## 2013-01-06 MED ORDER — HYDROMORPHONE HCL PF 1 MG/ML IJ SOLN: 0.2500 mg | INTRAMUSCULAR | Status: DC | PRN

## 2013-01-06 MED ORDER — ONDANSETRON HCL 4 MG/2ML IJ SOLN: 4.0000 mg | Freq: Four times a day (QID) | INTRAMUSCULAR | Status: DC | PRN

## 2013-01-06 MED ORDER — ONDANSETRON HCL 4 MG/2ML IJ SOLN
INTRAMUSCULAR | Status: DC | PRN
  Administered 2013-01-06: 4 mg via INTRAVENOUS

## 2013-01-06 MED ORDER — FENTANYL CITRATE 0.05 MG/ML IJ SOLN
INTRAMUSCULAR | Status: DC | PRN
  Administered 2013-01-06: 150 ug via INTRAVENOUS
  Administered 2013-01-06: 100 ug via INTRAVENOUS

## 2013-01-06 MED ORDER — CEFAZOLIN SODIUM-DEXTROSE 2-3 GM-% IV SOLR
2.0000 g | Freq: Four times a day (QID) | INTRAVENOUS | Status: AC
  Administered 2013-01-06 (×2): 2 g via INTRAVENOUS
  Filled 2013-01-06 (×2): qty 50

## 2013-01-06 MED ORDER — ACETAMINOPHEN 650 MG RE SUPP: 650.0000 mg | Freq: Four times a day (QID) | RECTAL | Status: DC | PRN

## 2013-01-06 MED ORDER — OXYCODONE-ACETAMINOPHEN 5-325 MG PO TABS
1.0000 | ORAL_TABLET | ORAL | Status: DC | PRN
  Administered 2013-01-06: 1 via ORAL
  Administered 2013-01-06 – 2013-01-07 (×2): 2 via ORAL
  Administered 2013-01-07: 1 via ORAL
  Administered 2013-01-07: 2 via ORAL
  Filled 2013-01-06 (×2): qty 2
  Filled 2013-01-06 (×2): qty 1

## 2013-01-06 MED ORDER — DEXTROSE 5 % IV SOLN
3.0000 g | INTRAVENOUS | Status: AC
  Administered 2013-01-06: 3 g via INTRAVENOUS
  Filled 2013-01-06: qty 3000

## 2013-01-06 MED ORDER — HYDROMORPHONE HCL PF 1 MG/ML IJ SOLN
INTRAMUSCULAR | Status: AC
  Administered 2013-01-06: 10:00:00
  Filled 2013-01-06: qty 1

## 2013-01-06 MED ORDER — TRANEXAMIC ACID 100 MG/ML IV SOLN
1000.0000 mg | INTRAVENOUS | Status: AC
  Administered 2013-01-06: 1000 mg via INTRAVENOUS
  Filled 2013-01-06: qty 10

## 2013-01-06 MED ORDER — KETOROLAC TROMETHAMINE 30 MG/ML IJ SOLN
15.0000 mg | Freq: Four times a day (QID) | INTRAMUSCULAR | Status: AC
  Administered 2013-01-06 – 2013-01-07 (×3): 15 mg via INTRAVENOUS
  Filled 2013-01-06 (×3): qty 1

## 2013-01-06 MED ORDER — ACETAMINOPHEN 325 MG PO TABS: 650.0000 mg | ORAL_TABLET | Freq: Four times a day (QID) | ORAL | Status: DC | PRN

## 2013-01-06 MED ORDER — MENTHOL 3 MG MT LOZG
1.0000 | LOZENGE | OROMUCOSAL | Status: DC | PRN
  Filled 2013-01-06: qty 9

## 2013-01-06 MED ORDER — METHOCARBAMOL 100 MG/ML IJ SOLN
500.0000 mg | Freq: Four times a day (QID) | INTRAVENOUS | Status: DC | PRN
  Filled 2013-01-06: qty 5

## 2013-01-06 MED ORDER — METOPROLOL TARTRATE 50 MG PO TABS
50.0000 mg | ORAL_TABLET | Freq: Two times a day (BID) | ORAL | Status: DC
  Administered 2013-01-06 – 2013-01-08 (×4): 50 mg via ORAL
  Filled 2013-01-06 (×5): qty 1

## 2013-01-06 MED ORDER — FERROUS SULFATE 325 (65 FE) MG PO TABS
325.0000 mg | ORAL_TABLET | Freq: Two times a day (BID) | ORAL | Status: DC
  Administered 2013-01-06 – 2013-01-08 (×4): 325 mg via ORAL
  Filled 2013-01-06 (×6): qty 1

## 2013-01-06 MED ORDER — ALUM & MAG HYDROXIDE-SIMETH 200-200-20 MG/5ML PO SUSP: 30.0000 mL | ORAL | Status: DC | PRN

## 2013-01-06 MED ORDER — OXYCODONE-ACETAMINOPHEN 5-325 MG PO TABS
ORAL_TABLET | ORAL | Status: AC
  Administered 2013-01-06: 11:00:00
  Filled 2013-01-06: qty 2

## 2013-01-06 MED ORDER — EPHEDRINE SULFATE 50 MG/ML IJ SOLN
INTRAMUSCULAR | Status: DC | PRN
  Administered 2013-01-06 (×4): 10 mg via INTRAVENOUS

## 2013-01-06 MED ORDER — LISINOPRIL-HYDROCHLOROTHIAZIDE 20-25 MG PO TABS: 1.0000 | ORAL_TABLET | Freq: Every day | ORAL | Status: DC

## 2013-01-06 MED ORDER — ASPIRIN EC 325 MG PO TBEC
325.0000 mg | DELAYED_RELEASE_TABLET | Freq: Two times a day (BID) | ORAL | Status: DC
  Administered 2013-01-06 – 2013-01-08 (×4): 325 mg via ORAL
  Filled 2013-01-06 (×6): qty 1

## 2013-01-06 MED ORDER — ROCURONIUM BROMIDE 100 MG/10ML IV SOLN
INTRAVENOUS | Status: DC | PRN
  Administered 2013-01-06: 50 mg via INTRAVENOUS
  Administered 2013-01-06: 20 mg via INTRAVENOUS

## 2013-01-06 MED ORDER — HYDROCHLOROTHIAZIDE 25 MG PO TABS
25.0000 mg | ORAL_TABLET | Freq: Every day | ORAL | Status: DC
  Administered 2013-01-07 – 2013-01-08 (×2): 25 mg via ORAL
  Filled 2013-01-06 (×2): qty 1

## 2013-01-06 MED ORDER — BUPIVACAINE-EPINEPHRINE (PF) 0.25% -1:200000 IJ SOLN
INTRAMUSCULAR | Status: AC
  Filled 2013-01-06: qty 30

## 2013-01-06 MED ORDER — NITROGLYCERIN 0.4 MG SL SUBL: 0.4000 mg | SUBLINGUAL_TABLET | SUBLINGUAL | Status: DC | PRN

## 2013-01-06 MED ORDER — OXYCODONE HCL 5 MG PO TABS: 5.0000 mg | ORAL_TABLET | Freq: Once | ORAL | Status: DC | PRN

## 2013-01-06 MED ORDER — DIPHENHYDRAMINE HCL 25 MG PO CAPS: 50.0000 mg | ORAL_CAPSULE | Freq: Every evening | ORAL | Status: DC | PRN

## 2013-01-06 MED ORDER — ALBUMIN HUMAN 5 % IV SOLN
INTRAVENOUS | Status: DC | PRN
  Administered 2013-01-06 (×2): via INTRAVENOUS

## 2013-01-06 MED ORDER — PROPOFOL 10 MG/ML IV BOLUS
INTRAVENOUS | Status: DC | PRN
  Administered 2013-01-06: 200 mg via INTRAVENOUS

## 2013-01-06 MED ORDER — PROMETHAZINE HCL 25 MG/ML IJ SOLN: 12.5000 mg | Freq: Four times a day (QID) | INTRAMUSCULAR | Status: DC | PRN

## 2013-01-06 MED ORDER — BUPIVACAINE-EPINEPHRINE 0.25% -1:200000 IJ SOLN
INTRAMUSCULAR | Status: DC | PRN
  Administered 2013-01-06: 25 mL

## 2013-01-06 MED ORDER — SODIUM CHLORIDE 0.9 % IV SOLN
INTRAVENOUS | Status: DC
  Administered 2013-01-06 – 2013-01-07 (×2): via INTRAVENOUS

## 2013-01-06 MED ORDER — NEOSTIGMINE METHYLSULFATE 1 MG/ML IJ SOLN
INTRAMUSCULAR | Status: DC | PRN
  Administered 2013-01-06: 3 mg via INTRAVENOUS

## 2013-01-06 MED ORDER — POVIDONE-IODINE 7.5 % EX SOLN
Freq: Once | CUTANEOUS | Status: DC
  Filled 2013-01-06: qty 118

## 2013-01-06 MED ORDER — GABAPENTIN 600 MG PO TABS
600.0000 mg | ORAL_TABLET | Freq: Three times a day (TID) | ORAL | Status: DC
  Administered 2013-01-06 – 2013-01-08 (×7): 600 mg via ORAL
  Filled 2013-01-06 (×9): qty 1

## 2013-01-06 MED ORDER — AMLODIPINE BESYLATE 10 MG PO TABS
10.0000 mg | ORAL_TABLET | Freq: Every day | ORAL | Status: DC
  Administered 2013-01-07 – 2013-01-08 (×2): 10 mg via ORAL
  Filled 2013-01-06 (×2): qty 1

## 2013-01-06 MED ORDER — PHENYLEPHRINE HCL 10 MG/ML IJ SOLN
INTRAMUSCULAR | Status: DC | PRN
  Administered 2013-01-06 (×2): 40 ug via INTRAVENOUS

## 2013-01-06 MED ORDER — MIDAZOLAM HCL 5 MG/5ML IJ SOLN
INTRAMUSCULAR | Status: DC | PRN
  Administered 2013-01-06: 1 mg via INTRAVENOUS

## 2013-01-06 MED ORDER — HYDROMORPHONE HCL PF 1 MG/ML IJ SOLN
1.0000 mg | INTRAMUSCULAR | Status: DC | PRN
  Administered 2013-01-06: 1 mg via INTRAVENOUS

## 2013-01-06 MED ORDER — DIAZEPAM 5 MG PO TABS: 5.0000 mg | ORAL_TABLET | Freq: Every evening | ORAL | Status: DC | PRN

## 2013-01-06 MED ORDER — METHOCARBAMOL 500 MG PO TABS
500.0000 mg | ORAL_TABLET | Freq: Four times a day (QID) | ORAL | Status: DC | PRN
  Administered 2013-01-06 – 2013-01-07 (×4): 500 mg via ORAL
  Filled 2013-01-06 (×3): qty 1

## 2013-01-06 MED ORDER — ONDANSETRON HCL 4 MG PO TABS: 4.0000 mg | ORAL_TABLET | Freq: Four times a day (QID) | ORAL | Status: DC | PRN

## 2013-01-06 MED ORDER — ZOLPIDEM TARTRATE 5 MG PO TABS: 5.0000 mg | ORAL_TABLET | Freq: Every evening | ORAL | Status: DC | PRN

## 2013-01-06 MED ORDER — METHOCARBAMOL 500 MG PO TABS
ORAL_TABLET | ORAL | Status: AC
  Administered 2013-01-06: 11:00:00
  Filled 2013-01-06: qty 1

## 2013-01-06 MED ORDER — OXYCODONE HCL 5 MG/5ML PO SOLN: 5.0000 mg | Freq: Once | ORAL | Status: DC | PRN

## 2013-01-06 MED ORDER — GLYCOPYRROLATE 0.2 MG/ML IJ SOLN
INTRAMUSCULAR | Status: DC | PRN
  Administered 2013-01-06: .4 mg via INTRAVENOUS

## 2013-01-06 MED ORDER — LACTATED RINGERS IV SOLN
INTRAVENOUS | Status: DC | PRN
  Administered 2013-01-06 (×3): via INTRAVENOUS

## 2013-01-06 MED ORDER — PHENOL 1.4 % MT LIQD: 1.0000 | OROMUCOSAL | Status: DC | PRN

## 2013-01-06 SURGICAL SUPPLY — 55 items
BLADE SAW SAG 73X25 THK (BLADE) ×1
BLADE SAW SGTL 73X25 THK (BLADE) ×1 IMPLANT
BRUSH FEMORAL CANAL (MISCELLANEOUS) IMPLANT
CAPT HIP PF MOP ×2 IMPLANT
CLOTH BEACON ORANGE TIMEOUT ST (SAFETY) ×2 IMPLANT
COVER BACK TABLE 24X17X13 BIG (DRAPES) IMPLANT
COVER SURGICAL LIGHT HANDLE (MISCELLANEOUS) ×2 IMPLANT
DRAPE ORTHO SPLIT 77X108 STRL (DRAPES) ×2
DRAPE PROXIMA HALF (DRAPES) ×2 IMPLANT
DRAPE SURG ORHT 6 SPLT 77X108 (DRAPES) ×2 IMPLANT
DRAPE U-SHAPE 47X51 STRL (DRAPES) ×2 IMPLANT
DRILL BIT 7/64X5 (BIT) ×2 IMPLANT
DRSG MEPILEX BORDER 4X12 (GAUZE/BANDAGES/DRESSINGS) ×2 IMPLANT
DURAPREP 26ML APPLICATOR (WOUND CARE) ×2 IMPLANT
ELECT BLADE 6.5 EXT (BLADE) ×2 IMPLANT
ELECT CAUTERY BLADE 6.4 (BLADE) ×2 IMPLANT
ELECT REM PT RETURN 9FT ADLT (ELECTROSURGICAL) ×2
ELECTRODE REM PT RTRN 9FT ADLT (ELECTROSURGICAL) ×1 IMPLANT
EVACUATOR 1/8 PVC DRAIN (DRAIN) IMPLANT
GLOVE BIOGEL PI IND STRL 8 (GLOVE) ×2 IMPLANT
GLOVE BIOGEL PI INDICATOR 8 (GLOVE) ×2
GLOVE ECLIPSE 7.5 STRL STRAW (GLOVE) ×4 IMPLANT
GOWN PREVENTION PLUS XLARGE (GOWN DISPOSABLE) ×2 IMPLANT
GOWN SRG XL XLNG 56XLVL 4 (GOWN DISPOSABLE) ×1 IMPLANT
GOWN STRL NON-REIN LRG LVL3 (GOWN DISPOSABLE) ×2 IMPLANT
GOWN STRL NON-REIN XL XLG LVL4 (GOWN DISPOSABLE) ×1
HOOD PEEL AWAY FACE SHEILD DIS (HOOD) ×6 IMPLANT
IMMOBILIZER KNEE 20 (SOFTGOODS)
IMMOBILIZER KNEE 20 THIGH 36 (SOFTGOODS) IMPLANT
IMMOBILIZER KNEE 22 UNIV (SOFTGOODS) ×2 IMPLANT
IMMOBILIZER KNEE 24 THIGH 36 (MISCELLANEOUS) IMPLANT
IMMOBILIZER KNEE 24 UNIV (MISCELLANEOUS)
KIT BASIN OR (CUSTOM PROCEDURE TRAY) ×2 IMPLANT
KIT ROOM TURNOVER OR (KITS) ×2 IMPLANT
MANIFOLD NEPTUNE II (INSTRUMENTS) ×2 IMPLANT
NEEDLE 1/2 CIR MAYO (NEEDLE) ×2 IMPLANT
NEEDLE 22X1 1/2 (OR ONLY) (NEEDLE) ×2 IMPLANT
NS IRRIG 1000ML POUR BTL (IV SOLUTION) ×2 IMPLANT
PACK TOTAL JOINT (CUSTOM PROCEDURE TRAY) ×2 IMPLANT
PAD ARMBOARD 7.5X6 YLW CONV (MISCELLANEOUS) ×4 IMPLANT
PASSER SUT SWANSON 36MM LOOP (INSTRUMENTS) ×2 IMPLANT
PRESSURIZER FEMORAL UNIV (MISCELLANEOUS) IMPLANT
STAPLER VISISTAT 35W (STAPLE) ×2 IMPLANT
SUCTION FRAZIER TIP 10 FR DISP (SUCTIONS) ×2 IMPLANT
SUT ETHIBOND 2 V 37 (SUTURE) ×2 IMPLANT
SUT VIC AB 0 CTXB 36 (SUTURE) ×4 IMPLANT
SUT VIC AB 1 CTX 36 (SUTURE) ×2
SUT VIC AB 1 CTX36XBRD ANBCTR (SUTURE) ×2 IMPLANT
SUT VIC AB 2-0 CTB1 (SUTURE) ×4 IMPLANT
SYR CONTROL 10ML LL (SYRINGE) ×2 IMPLANT
TOWEL OR 17X24 6PK STRL BLUE (TOWEL DISPOSABLE) ×2 IMPLANT
TOWEL OR 17X26 10 PK STRL BLUE (TOWEL DISPOSABLE) ×2 IMPLANT
TOWER CARTRIDGE SMART MIX (DISPOSABLE) IMPLANT
TRAY FOLEY CATH 16FRSI W/METER (SET/KITS/TRAYS/PACK) ×2 IMPLANT
WATER STERILE IRR 1000ML POUR (IV SOLUTION) ×8 IMPLANT

## 2013-01-06 NOTE — Discharge Instructions (Signed)
Total Hip Replacement °Care After °Refer to this sheet in the next few weeks. These instructions provide you with information on caring for yourself after your procedure. Your caregiver also may give you specific instructions. Your treatment has been planned according to the most current medical practices, but problems sometimes occur. Call your caregiver if you have any problems or questions after your procedure. °HOME CARE INSTRUCTIONS  °Your caregiver will give you specific precautions for certain types of movement. Additional instructions include: °· Take over-the-counter or prescription medicines for pain, discomfort, or fever only as directed by your caregiver. °· Take quick showers (3 5 min) rather than bathe until your caregiver tells you that you can take baths again. °· Avoid lifting until your caregiver instructs you otherwise. °· Use a raised toilet seat and avoid sitting in low chairs as instructed by your caregiver. °· Use crutches or a walker as instructed by your caregiver. °SEEK MEDICAL CARE IF: °· You have difficulty breathing. °· Your wound is red, swollen, or has become increasingly painful. °· You have pus draining from your wound. °· You have a bad smell coming from your wound. °· You have persistent bleeding from your wound. °· Your wound breaks open after sutures (stitches) or staples have been removed. °SEEK IMMEDIATE MEDICAL CARE IF:  °· You have a fever. °· You have a rash. °· You have pain or swelling in your calf or thigh. °· You have shortness of breath or chest pain. °MAKE SURE YOU: °· Understand these instructions. °· Will watch your condition. °· Will get help right away if you are not doing well or get worse. °Document Released: 08/08/2004 Document Revised: 07/21/2011 Document Reviewed: 03/08/2011 °[employer]® Patient Information ©2014 [employer]. ° °

## 2013-01-06 NOTE — Care Management Note (Signed)
Patient fresh post-op. Case Manager will follow. PT/OT have to eval patient. Vance Peper, RN BSN

## 2013-01-06 NOTE — Transfer of Care (Signed)
Immediate Anesthesia Transfer of Care Note  Patient: Steven Patton  Procedure(s) Performed: Procedure(s): LEFT TOTAL HIP ARTHROPLASTY-Posterior approach (Left)  Patient Location: PACU  Anesthesia Type:General  Level of Consciousness: awake, alert  and oriented  Airway & Oxygen Therapy: Patient Spontanous Breathing and Patient connected to face mask oxygen  Post-op Assessment: Report given to PACU RN and Post -op Vital signs reviewed and stable  Post vital signs: Reviewed and stable  Complications: No apparent anesthesia complications

## 2013-01-06 NOTE — H&P (Signed)
TOTAL HIP ADMISSION H&P  Patient is admitted for left total hip arthroplasty.  Subjective:  Chief Complaint: left hip pain  HPI: Steven Patton, 77 y.o. male, has a history of pain and functional disability in the left hip(s) due to arthritis and patient has failed non-surgical conservative treatments for greater than 12 weeks to include NSAID's and/or analgesics, flexibility and strengthening excercises, use of assistive devices, weight reduction as appropriate and activity modification.  Onset of symptoms was gradual starting 3 years ago with gradually worsening course since that time.The patient noted no past surgery on the left hip(s).  Patient currently rates pain in the left hip at 8 out of 10 with activity. Patient has night pain, worsening of pain with activity and weight bearing, trendelenberg gait, pain that interfers with activities of daily living, pain with passive range of motion, crepitus and joint swelling. Patient has evidence of subchondral cysts, subchondral sclerosis, joint subluxation and joint space narrowing by imaging studies. This condition presents safety issues increasing the risk of falls. This patient has had failure of conservative care.  There is no current active infection.  Patient Active Problem List   Diagnosis Date Noted  . Preop cardiovascular exam 07/27/2012  . Elevated white blood cell count 07/27/2012  . Obesity 06/20/2012  . Chest pain 06/08/2012  . Osteoarthritis of left hip 05/18/2012  . Lumbar stenosis with neurogenic claudication 01/29/2012  . Knee osteoarthritis 01/18/2012  . Hypertension 01/04/2012  . Lower extremity edema 01/04/2012  . Skin lesions 01/04/2012  . Hyperlipidemia 01/04/2012  . Preventive measure 01/04/2012  . Sleep apnea 01/04/2012   Past Medical History  Diagnosis Date  . Cataract   . Macular degeneration, left eye   . Arthritis   . Sleep apnea     2008.Marland KitchenMarland KitchenWEARS CPAP  . Hypertension     takes Amlodipine,Lisinopril,and  Metoprolol daily    Past Surgical History  Procedure Laterality Date  . Knee surgery    . Cardiac catheterization  06-10-12  . Eye surgery      CATARACT RIGHT EYE  . Tonsillectomy    . Total knee arthroplasty Left 09/16/2012    Procedure: TOTAL KNEE ARTHROPLASTY;  Surgeon: Harvie Junior, MD;  Location: [facility] OR;  Service: Orthopedics;  Laterality: Left;  . Lumbar laminectomy/decompression microdiscectomy N/A 11/02/2012    Procedure: LUMBAR LAMINECTOMY/DECOMPRESSION MICRODISCECTOMY 2 LEVEL;  Surgeon: Emilee Hero, MD;  Location: [facility] OR;  Service: Orthopedics;  Laterality: N/A;  Lumbar 4-5,lumbar 5-sacrum 1 decompression    Prescriptions prior to admission  Medication Sig Dispense Refill  . amLODipine (NORVASC) 10 MG tablet Take 10 mg by mouth daily.      . diazepam (VALIUM) 5 MG tablet Take 5 mg by mouth at bedtime as needed for anxiety.      . diphenhydrAMINE (BENADRYL) 25 mg capsule Take 50 mg by mouth at bedtime as needed for sleep.      Marland Kitchen gabapentin (NEURONTIN) 600 MG tablet Take 600 mg by mouth 3 (three) times daily.      Marland Kitchen lisinopril-hydrochlorothiazide (PRINZIDE,ZESTORETIC) 20-25 MG per tablet Take 1 tablet by mouth daily.      . metoprolol (LOPRESSOR) 50 MG tablet Take 50 mg by mouth 2 (two) times daily.      . Omega-3 Fatty Acids (FISH OIL PO) Take 1 capsule by mouth daily.       Marland Kitchen oxyCODONE-acetaminophen (PERCOCET) 10-325 MG per tablet Take 1 tablet by mouth every 8 (eight) hours as needed for pain.      Marland Kitchen  tiZANidine (ZANAFLEX) 4 MG tablet Take 4 mg by mouth at bedtime as needed for muscle spasms.      . nitroGLYCERIN (NITROSTAT) 0.4 MG SL tablet Place 0.4 mg under the tongue every 5 (five) minutes as needed for chest pain.       No Known Allergies  History  Substance Use Topics  . Smoking status: Former Smoker -- 1.00 packs/day for 20 years    Types: Cigarettes    Quit date: 01/04/1993  . Smokeless tobacco: Former Neurosurgeon    Types: Chew    Quit date: 01/04/1993      Comment: quit 2yrs ago  . Alcohol Use: No    Family History  Problem Relation Age of Onset  . Cancer Daughter      ROS ROS: I have reviewed the patient's review of systems thoroughly and there are no positive responses as relates to the HPI. Objective:  Physical Exam  Vital signs in last 24 hours: Temp:  [97.8 F (36.6 C)] 97.8 F (36.6 C) (12/05 1610) Pulse Rate:  [53] 53 (12/05 0608) Resp:  [20] 20 (12/05 0608) BP: (120)/(67) 120/67 mmHg (12/05 0608) SpO2:  [96 %] 96 % (12/05 9604) Well-developed well-nourished patient in no acute distress. Alert and oriented x3 HEENT:within normal limits Cardiac: Regular rate and rhythm Pulmonary: Lungs clear to auscultation Abdomen: Soft and nontender.  Normal active bowel sounds  Musculoskeletal: (left hip: Extremely limited range of motion.  Pain with all range of motion.  Neurovascularly intact distally. Labs: Recent Results (from the past 2160 hour(s))  SURGICAL PCR SCREEN     Status: None   Collection Time    10/28/12  1:09 PM      Result Value Range   MRSA, PCR NEGATIVE  NEGATIVE   Staphylococcus aureus NEGATIVE  NEGATIVE   Comment:            The Xpert SA Assay (FDA     approved for NASAL specimens     in patients over 34 years of age),     is one component of     a comprehensive surveillance     program.  Test performance has     been validated by [employer] for patients greater     than or equal to 82 year old.     It is not intended     to diagnose infection nor to     guide or monitor treatment.  URINALYSIS, ROUTINE W REFLEX MICROSCOPIC     Status: None   Collection Time    10/28/12  1:10 PM      Result Value Range   Color, Urine YELLOW  YELLOW   APPearance CLEAR  CLEAR   Specific Gravity, Urine 1.023  1.005 - 1.030   pH 6.0  5.0 - 8.0   Glucose, UA NEGATIVE  NEGATIVE mg/dL   Hgb urine dipstick NEGATIVE  NEGATIVE   Bilirubin Urine NEGATIVE  NEGATIVE   Ketones, ur NEGATIVE  NEGATIVE mg/dL   Protein,  ur NEGATIVE  NEGATIVE mg/dL   Urobilinogen, UA 0.2  0.0 - 1.0 mg/dL   Nitrite NEGATIVE  NEGATIVE   Leukocytes, UA NEGATIVE  NEGATIVE   Comment: MICROSCOPIC NOT DONE ON URINES WITH NEGATIVE PROTEIN, BLOOD, LEUKOCYTES, NITRITE, OR GLUCOSE <1000 mg/dL.  APTT     Status: None   Collection Time    10/28/12  1:11 PM      Result Value Range   aPTT 29  24 -  37 seconds  CBC WITH DIFFERENTIAL     Status: Abnormal   Collection Time    10/28/12  1:11 PM      Result Value Range   WBC 12.3 (*) 4.0 - 10.5 K/uL   RBC 5.07  4.22 - 5.81 MIL/uL   Hemoglobin 15.8  13.0 - 17.0 g/dL   HCT 57.8  46.9 - 62.9 %   MCV 87.2  78.0 - 100.0 fL   MCH 31.2  26.0 - 34.0 pg   MCHC 35.7  30.0 - 36.0 g/dL   RDW 52.8  41.3 - 24.4 %   Platelets 223  150 - 400 K/uL   Neutrophils Relative % 62  43 - 77 %   Neutro Abs 7.6  1.7 - 7.7 K/uL   Lymphocytes Relative 24  12 - 46 %   Lymphs Abs 2.9  0.7 - 4.0 K/uL   Monocytes Relative 11  3 - 12 %   Monocytes Absolute 1.4 (*) 0.1 - 1.0 K/uL   Eosinophils Relative 2  0 - 5 %   Eosinophils Absolute 0.3  0.0 - 0.7 K/uL   Basophils Relative 1  0 - 1 %   Basophils Absolute 0.1  0.0 - 0.1 K/uL  COMPREHENSIVE METABOLIC PANEL     Status: Abnormal   Collection Time    10/28/12  1:11 PM      Result Value Range   Sodium 140  135 - 145 mEq/L   Potassium 3.5  3.5 - 5.1 mEq/L   Chloride 99  96 - 112 mEq/L   CO2 21  19 - 32 mEq/L   Glucose, Bld 126 (*) 70 - 99 mg/dL   BUN 18  6 - 23 mg/dL   Creatinine, Ser 0.10  0.50 - 1.35 mg/dL   Calcium 27.2  8.4 - 53.6 mg/dL   Total Protein 7.5  6.0 - 8.3 g/dL   Albumin 3.9  3.5 - 5.2 g/dL   AST 15  0 - 37 U/L   ALT 13  0 - 53 U/L   Alkaline Phosphatase 58  39 - 117 U/L   Total Bilirubin 0.5  0.3 - 1.2 mg/dL   GFR calc non Af Amer 60 (*) >90 mL/min   GFR calc Af Amer 70 (*) >90 mL/min   Comment: (NOTE)     The eGFR has been calculated using the CKD EPI equation.     This calculation has not been validated in all clinical situations.      eGFR's persistently <90 mL/min signify possible Chronic Kidney     Disease.  PROTIME-INR     Status: None   Collection Time    10/28/12  1:11 PM      Result Value Range   Prothrombin Time 13.0  11.6 - 15.2 seconds   INR 1.00  0.00 - 1.49  TYPE AND SCREEN     Status: None   Collection Time    10/28/12  1:13 PM      Result Value Range   ABO/RH(D) A POS     Antibody Screen NEG     Sample Expiration 11/11/2012    TYPE AND SCREEN     Status: None   Collection Time    01/04/13 12:50 PM      Result Value Range   ABO/RH(D) A POS     Antibody Screen NEG     Sample Expiration 01/18/2013    APTT     Status: None   Collection Time  01/04/13  1:00 PM      Result Value Range   aPTT 28  24 - 37 seconds  CBC WITH DIFFERENTIAL     Status: Abnormal   Collection Time    01/04/13  1:00 PM      Result Value Range   WBC 11.0 (*) 4.0 - 10.5 K/uL   RBC 5.08  4.22 - 5.81 MIL/uL   Hemoglobin 15.2  13.0 - 17.0 g/dL   HCT 16.1  09.6 - 04.5 %   MCV 88.2  78.0 - 100.0 fL   MCH 29.9  26.0 - 34.0 pg   MCHC 33.9  30.0 - 36.0 g/dL   RDW 40.9  81.1 - 91.4 %   Platelets 160  150 - 400 K/uL   Neutrophils Relative % 60  43 - 77 %   Neutro Abs 6.6  1.7 - 7.7 K/uL   Lymphocytes Relative 23  12 - 46 %   Lymphs Abs 2.5  0.7 - 4.0 K/uL   Monocytes Relative 15 (*) 3 - 12 %   Monocytes Absolute 1.6 (*) 0.1 - 1.0 K/uL   Eosinophils Relative 3  0 - 5 %   Eosinophils Absolute 0.3  0.0 - 0.7 K/uL   Basophils Relative 1  0 - 1 %   Basophils Absolute 0.1  0.0 - 0.1 K/uL  COMPREHENSIVE METABOLIC PANEL     Status: Abnormal   Collection Time    01/04/13  1:00 PM      Result Value Range   Sodium 141  135 - 145 mEq/L   Potassium 3.8  3.5 - 5.1 mEq/L   Chloride 100  96 - 112 mEq/L   CO2 27  19 - 32 mEq/L   Glucose, Bld 113 (*) 70 - 99 mg/dL   BUN 13  6 - 23 mg/dL   Creatinine, Ser 7.82  0.50 - 1.35 mg/dL   Calcium 9.7  8.4 - 95.6 mg/dL   Total Protein 7.5  6.0 - 8.3 g/dL   Albumin 4.0  3.5 - 5.2 g/dL   AST  19  0 - 37 U/L   ALT 17  0 - 53 U/L   Alkaline Phosphatase 50  39 - 117 U/L   Total Bilirubin 0.3  0.3 - 1.2 mg/dL   GFR calc non Af Amer 68 (*) >90 mL/min   GFR calc Af Amer 79 (*) >90 mL/min   Comment: (NOTE)     The eGFR has been calculated using the CKD EPI equation.     This calculation has not been validated in all clinical situations.     eGFR's persistently <90 mL/min signify possible Chronic Kidney     Disease.  PROTIME-INR     Status: None   Collection Time    01/04/13  1:00 PM      Result Value Range   Prothrombin Time 12.6  11.6 - 15.2 seconds   INR 0.96  0.00 - 1.49  SURGICAL PCR SCREEN     Status: None   Collection Time    01/04/13  1:01 PM      Result Value Range   MRSA, PCR NEGATIVE  NEGATIVE   Staphylococcus aureus NEGATIVE  NEGATIVE   Comment:            The Xpert SA Assay (FDA     approved for NASAL specimens     in patients over 60 years of age),     is one component of  a comprehensive surveillance     program.  Test performance has     been validated by [facility] for patients greater     than or equal to 35 year old.     It is not intended     to diagnose infection nor to     guide or monitor treatment.  URINALYSIS, ROUTINE W REFLEX MICROSCOPIC     Status: None   Collection Time    01/04/13  1:01 PM      Result Value Range   Color, Urine YELLOW  YELLOW   APPearance CLEAR  CLEAR   Specific Gravity, Urine 1.020  1.005 - 1.030   pH 6.0  5.0 - 8.0   Glucose, UA NEGATIVE  NEGATIVE mg/dL   Hgb urine dipstick NEGATIVE  NEGATIVE   Bilirubin Urine NEGATIVE  NEGATIVE   Ketones, ur NEGATIVE  NEGATIVE mg/dL   Protein, ur NEGATIVE  NEGATIVE mg/dL   Urobilinogen, UA 0.2  0.0 - 1.0 mg/dL   Nitrite NEGATIVE  NEGATIVE   Leukocytes, UA NEGATIVE  NEGATIVE   Comment: MICROSCOPIC NOT DONE ON URINES WITH NEGATIVE PROTEIN, BLOOD, LEUKOCYTES, NITRITE, OR GLUCOSE <1000 mg/dL.    Estimated body mass index is 39.41 kg/(m^2) as calculated from the  following:   Height as of 01/04/13: 5\' 9"  (1.753 m).   Weight as of 11/23/12: 121.11 kg (267 lb).   Imaging Review Plain radiographs demonstrate severe degenerative joint disease of the bilateral hip(s). The bone quality appears to be good for age and reported activity level.  Assessment/Plan:  End stage arthritis, left hip(s)  The patient history, physical examination, clinical judgement of the provider and imaging studies are consistent with end stage degenerative joint disease of the left hip(s) and total hip arthroplasty is deemed medically necessary. The treatment options including medical management, injection therapy, arthroscopy and arthroplasty were discussed at length. The risks and benefits of total hip arthroplasty were presented and reviewed. The risks due to aseptic loosening, infection, stiffness, dislocation/subluxation,  thromboembolic complications and other imponderables were discussed.  The patient acknowledged the explanation, agreed to proceed with the plan and consent was signed. Patient is being admitted for inpatient treatment for surgery, pain control, PT, OT, prophylactic antibiotics, VTE prophylaxis, progressive ambulation and ADL's and discharge planning.The patient is planning to be discharged home with home health services

## 2013-01-06 NOTE — Brief Op Note (Signed)
01/06/2013  9:27 AM  PATIENT:  Steven Patton  77 y.o. male  PRE-OPERATIVE DIAGNOSIS:  degenerative joint disease  POST-OPERATIVE DIAGNOSIS:  degenerative joint disease  PROCEDURE:  Procedure(s): LEFT TOTAL HIP ARTHROPLASTY-Posterior approach (Left)  SURGEON:  Surgeon(s) and Role:    * Harvie Junior, MD - Primary  PHYSICIAN ASSISTANT:   ASSISTANTS: bethune   ANESTHESIA:   general  EBL:  Total I/O In: 2500 [I.V.:2000; IV Piggyback:500] Out: 1200 [Urine:200; Blood:1000]  BLOOD ADMINISTERED:none  DRAINS: none   LOCAL MEDICATIONS USED:  MARCAINE     SPECIMEN:  No Specimen  DISPOSITION OF SPECIMEN:  N/A  COUNTS:  YES  TOURNIQUET:  * No tourniquets in log *  DICTATION: .Other Dictation: Dictation Number [id number]  PLAN OF CARE: Admit to inpatient   PATIENT DISPOSITION:  PACU - hemodynamically stable.   Delay start of Pharmacological VTE agent (>24hrs) due to surgical blood loss or risk of bleeding: no

## 2013-01-06 NOTE — Anesthesia Postprocedure Evaluation (Signed)
Anesthesia Post Note  Patient: Steven Patton  Procedure(s) Performed: Procedure(s) (LRB): LEFT TOTAL HIP ARTHROPLASTY-Posterior approach (Left)  Anesthesia type: general  Patient location: PACU  Post pain: Pain level controlled  Post assessment: Patient's Cardiovascular Status Stable  Last Vitals:  Filed Vitals:   01/06/13 1215  BP: 122/66  Pulse: 71  Temp: 36.7 C  Resp: 20    Post vital signs: Reviewed and stable  Level of consciousness: sedated  Complications: No apparent anesthesia complications

## 2013-01-06 NOTE — Preoperative (Signed)
Beta Blockers   Reason not to administer Beta Blockers:received lopressor this morning 

## 2013-01-06 NOTE — Anesthesia Preprocedure Evaluation (Addendum)
Anesthesia Evaluation  Patient identified by MRN, date of birth, ID band Patient awake    Reviewed: Allergy & Precautions, H&P , NPO status , Patient's Chart, lab work & pertinent test results, reviewed documented beta blocker date and time   History of Anesthesia Complications Negative for: history of anesthetic complications  Airway Mallampati: II TM Distance: >3 FB Neck ROM: Full    Dental  (+) Edentulous Upper, Edentulous Lower and Dental Advisory Given   Pulmonary sleep apnea and Continuous Positive Airway Pressure Ventilation , former smoker,    Pulmonary exam normal       Cardiovascular hypertension,     Neuro/Psych negative neurological ROS  negative psych ROS   GI/Hepatic negative GI ROS, Neg liver ROS,   Endo/Other  Morbid obesity  Renal/GU negative Renal ROS     Musculoskeletal   Abdominal   Peds  Hematology   Anesthesia Other Findings   Reproductive/Obstetrics                         Anesthesia Physical Anesthesia Plan  ASA: III  Anesthesia Plan: General   Post-op Pain Management:    Induction: Intravenous  Airway Management Planned: Oral ETT  Additional Equipment:   Intra-op Plan:   Post-operative Plan: Extubation in OR  Informed Consent: I have reviewed the patients History and Physical, chart, labs and discussed the procedure including the risks, benefits and alternatives for the proposed anesthesia with the patient or authorized representative who has indicated his/her understanding and acceptance.   Dental advisory given  Plan Discussed with: CRNA, Anesthesiologist and Surgeon  Anesthesia Plan Comments:        Anesthesia Quick Evaluation

## 2013-01-06 NOTE — Evaluation (Signed)
Physical Therapy Evaluation Patient Details Name: Issachar Broady MRN: [id number] DOB: 1935/10/08 Today's Date: 01/06/2013 Time: 4098-1191 PT Time Calculation (min): 25 min  PT Assessment / Plan / Recommendation History of Present Illness  S/p elective Lt THA PMH includes: obesity; lumbar stenosis, HTN, hyperlipidemia  Clinical Impression  Pt is s/p LT THA POD#0 resulting in the deficits listed below (see PT Problem List). Pt will benefit from skilled PT to increase their independence and safety with mobility to allow discharge to the venue listed below. Pt highly motivated to return home with wife on Sunday.     PT Assessment  Patient needs continued PT services    Follow Up Recommendations  Home health PT;Supervision/Assistance - 24 hour    Does the patient have the potential to tolerate intense rehabilitation      Barriers to Discharge        Equipment Recommendations  None recommended by PT    Recommendations for Other Services OT consult   Frequency 7X/week    Precautions / Restrictions Precautions Precautions: Posterior Hip;Fall Precaution Booklet Issued: Yes (comment) Precaution Comments: pt given hip precautions handout and educated; cues throughout session to maintain  Required Braces or Orthoses: Knee Immobilizer - Left Knee Immobilizer - Left: Other (comment) (no MD orders ) Restrictions Weight Bearing Restrictions: Yes LLE Weight Bearing: Weight bearing as tolerated   Pertinent Vitals/Pain 1/10. patient repositioned for comfort       Mobility  Bed Mobility Bed Mobility: Not assessed Details for Bed Mobility Assistance: pt sitting in recliner; reports the bed hurts his back and was transfered by RN Transfers Transfers: Stand to Sit;Sit to Stand Sit to Stand: 4: Min assist;From chair/3-in-1;With armrests;With upper extremity assist Stand to Sit: 4: Min assist;To chair/3-in-1;With upper extremity assist;With armrests Details for Transfer Assistance: pt  required (A) to maintain balance and achieve upright standing position and control descent to chair while maintaining hip precautions; cues for hand placement and sequencing with RW  Ambulation/Gait Ambulation/Gait Assistance: 4: Min guard Ambulation Distance (Feet): 12 Feet Assistive device: Rolling walker Ambulation/Gait Assistance Details: cues for upright posture and gt sequencing; encouraged to maintain hip precautions with turnining; pt and wife reported the pt is ambulating better than he was prior to surgery; pt guarded and decreased cadence secondary to fear of pain  Gait Pattern: Step-to pattern;Decreased stance time - right;Decreased step length - left;Trunk flexed;Wide base of support Gait velocity: decreased; guarded  Stairs: No Wheelchair Mobility Wheelchair Mobility: No    Exercises Total Joint Exercises Ankle Circles/Pumps: AROM;Both;10 reps;Seated   PT Diagnosis: Difficulty walking;Acute pain  PT Problem List: Decreased strength;Decreased range of motion;Decreased activity tolerance;Decreased balance;Decreased mobility;Decreased knowledge of use of DME;Decreased knowledge of precautions;Pain PT Treatment Interventions: DME instruction;Gait training;Stair training;Functional mobility training;Therapeutic activities;Therapeutic exercise;Balance training;Neuromuscular re-education;Patient/family education     PT Goals(Current goals can be found in the care plan section) Acute Rehab PT Goals Patient Stated Goal: to go home sunday PT Goal Formulation: With patient Time For Goal Achievement: 01/13/13 Potential to Achieve Goals: Good  Visit Information  Last PT Received On: 01/06/13 Assistance Needed: +1 History of Present Illness: S/p elective Lt THA        Prior Functioning  Home Living Family/patient expects to be discharged to:: Private residence Living Arrangements: Spouse/significant other Available Help at Discharge: Family;Available 24 hours/day Type of Home:  House Home Access: Level entry Home Layout: Two level;Able to live on main level with bedroom/bathroom Home Equipment: Walker - 4 wheels;Walker - 2 wheels;Bedside commode;Shower seat;Hand held  shower head Additional Comments: pt has walk in shower  Prior Function Level of Independence: Needs assistance Gait / Transfers Assistance Needed: pt has been limiting in ambulation due to pain and weakness; would sit on rolator and roll throughout the house  ADL's / Homemaking Assistance Needed: pt requires (A) from wife to dress LEs and take a shower Comments: per wife and pt; pt had declined in funtional independence in the past few months due to recent surgeries and Lt hip pain Communication Communication: No difficulties    Cognition  Cognition Arousal/Alertness: Awake/alert Behavior During Therapy: WFL for tasks assessed/performed Overall Cognitive Status: Within Functional Limits for tasks assessed    Extremity/Trunk Assessment Upper Extremity Assessment Upper Extremity Assessment: Defer to OT evaluation Lower Extremity Assessment Lower Extremity Assessment: LLE deficits/detail LLE: Unable to fully assess due to pain LLE Sensation: history of peripheral neuropathy Cervical / Trunk Assessment Cervical / Trunk Assessment: Kyphotic   Balance Balance Balance Assessed: Yes Static Standing Balance Static Standing - Balance Support: Bilateral upper extremity supported;During functional activity Static Standing - Level of Assistance: 5: Stand by assistance  End of Session PT - End of Session Equipment Utilized During Treatment: Gait belt;Left knee immobilizer Activity Tolerance: Patient tolerated treatment well Patient left: in chair;with call bell/phone within reach;with nursing/sitter in room;with family/visitor present Nurse Communication: Mobility status  GP     Donell Sievert, [place] [phone] 01/06/2013, 2:49 PM

## 2013-01-07 LAB — CBC
HCT: 31.6 % — ABNORMAL LOW (ref 39.0–52.0)
MCH: 29.2 pg (ref 26.0–34.0)
MCHC: 33.2 g/dL (ref 30.0–36.0)
MCV: 88 fL (ref 78.0–100.0)
Platelets: 121 10*3/uL — ABNORMAL LOW (ref 150–400)
RBC: 3.59 MIL/uL — ABNORMAL LOW (ref 4.22–5.81)
RDW: 13.8 % (ref 11.5–15.5)
WBC: 11.8 10*3/uL — ABNORMAL HIGH (ref 4.0–10.5)

## 2013-01-07 LAB — BASIC METABOLIC PANEL
CO2: 26 mEq/L (ref 19–32)
Calcium: 8.4 mg/dL (ref 8.4–10.5)
Chloride: 102 mEq/L (ref 96–112)
Creatinine, Ser: 1 mg/dL (ref 0.50–1.35)
GFR calc Af Amer: 83 mL/min — ABNORMAL LOW (ref >90)
Potassium: 4.2 mEq/L (ref 3.5–5.1)
Sodium: 138 mEq/L (ref 135–145)

## 2013-01-07 NOTE — Progress Notes (Signed)
Subjective: 1 Day Post-Op Procedure(s) (LRB): LEFT TOTAL HIP ARTHROPLASTY-Posterior approach (Left) Patient reports pain as 3 on 0-10 scale.   Patient up to a chair.  Taking by mouth/voiding okay.  Objective: Vital signs in last 24 hours: Temp:  [96.4 F (35.8 C)-100 F (37.8 C)] 98.1 F (36.7 C) (12/06 0457) Pulse Rate:  [66-109] 84 (12/06 0530) Resp:  [9-20] 18 (12/06 0457) BP: (92-131)/(44-77) 100/56 mmHg (12/06 0530) SpO2:  [91 %-97 %] 93 % (12/06 0457) Weight:  [122.9 kg (270 lb 15.1 oz)] 122.9 kg (270 lb 15.1 oz) (12/05 1100)  Intake/Output from previous day: 12/05 0701 - 12/06 0700 In: 3280 [P.O.:480; I.V.:2300; IV Piggyback:500] Out: 2575 [Urine:1575; Blood:1000] Intake/Output this shift: Total I/O In: 2463.3 [P.O.:360; I.V.:2103.3] Out: -    Recent Labs  01/04/13 1300 01/06/13 0912 01/07/13 0357  HGB 15.2 11.6* 10.5*    Recent Labs  01/04/13 1300 01/06/13 0912 01/07/13 0357  WBC 11.0*  --  11.8*  RBC 5.08  --  3.59*  HCT 44.8 34.0* 31.6*  PLT 160  --  121*    Recent Labs  01/04/13 1300 01/06/13 0912 01/07/13 0357  NA 141 141 138  K 3.8 3.3* 4.2  CL 100  --  102  CO2 27  --  26  BUN 13  --  14  CREATININE 1.04  --  1.00  GLUCOSE 113* 114* 121*  CALCIUM 9.7  --  8.4    Recent Labs  01/04/13 1300  INR 0.96   Left hip exam: Neurovascular intact Sensation intact distally Intact pulses distally Dorsiflexion/Plantar flexion intact Incision: dressing C/D/I Compartment soft  Assessment/Plan: 1 Day Post-Op Procedure(s) (LRB): LEFT TOTAL HIP ARTHROPLASTY-Posterior approach (Left) Plan:  Up with therapy Discharge home with home health tomorrow. Aspirin 325 mg twice daily with SCDs for DVT prophylaxis. Foley catheter removed this morning. Weightbearing as tolerated on the left with posterior hip precautions.  Yardley Beltran G 01/07/2013, 9:29 AM

## 2013-01-07 NOTE — Op Note (Signed)
NAMECAELAN, BRANDEN NO.:  [id number]  MEDICAL RECORD NO.:  [id number]  LOCATION:  5N06C                        FACILITY:  [facility]  PHYSICIAN:  Harvie Junior, M.D.   DATE OF BIRTH:  1935/09/29  DATE OF PROCEDURE:  01/06/2013 DATE OF DISCHARGE:                              OPERATIVE REPORT   PREOPERATIVE DIAGNOSIS:  End-stage degenerative joint disease, left hip.  POSTOPERATIVE DIAGNOSIS:  End-stage degenerative joint disease, left hip.  PROCEDURE:  Left total hip replacement with S-ROM system with a 36+ 8 offset stem 2015, a 67F large cone, a 54 mm pinnacle cup with a +4 neutral liner, and a 36 mm +3 metal ball.  SURGEON:  Harvie Junior, M.D.  ASSISTANT:  Marshia Ly, PA  ANESTHESIA:  General.  BRIEF HISTORY:  Mr. Lagrange is a 77 year old male with a long history and significant complaints of left hip pain.  He has had dramatic collapse of the left hip over the last several months, and presents with inability to stand and walk.  There was no rotation of the hip.  He is having severe night pain and light activity pain after failure of all conservative care, and because of x-ray showing this dramatic collapse, he was taken to the operating room for left total hip replacement.  PROCEDURE:  The patient was taken to the operating room.  After adequate anesthesia was obtained with general anesthetic, the patient was placed supine on the operating table.  Left hip was prepped and draped in usual sterile fashion.  Following this, an incision was made for posterior approach to the hip.  Subcutaneous tissue down to the level of the tensor fascia was divided in line with its fibers and the gluteus maximus was finger fractured.  Charnley retractors were put in place. At this point, the hip was internally rotated and the short external rotators and piriformis were taken down as a layer and tagged.  The hip was then dislocated, a provisional neck cut was made.   Attention was turned to the acetabulum which was sequentially reamed up to a level of 53 mm and a 54 mm pinnacle cup was then put into place with a +4 neutral liner trial placed and attention was then turned to the stem, which is opened with the cookie cutter, lateralized, and then sequentially reamed up to a level of 15, 5 all the way down, 16 is taken halfway down.  The cone is then trialed and then becomes a 67F large cone.  At this point, trials were placed 67F large.  Cone was placed, 2015 stem with 36+ 8 offset in a neutral position and +0 ball, pretty decent range of motion and stability, but we felt it could be improved in both length and eversion.  A 10 degrees of  eversion was added to the stem and a +3 ball.  Excellent range of motion and stability was achieved at this point.  Following this, the trial components were removed.  Attention was turned back to the acetabulum where the trial liner was removed. The hole eliminator was placed and a final poly was placed.  The attention was then turned to the stem where the  cone is put in place.  A 2015 cone is placed, followed by 36+ 8 offset stem was put in place.  A 2015 is put in 10 degrees of eversion relative to the cone and then we trialed again with a +0 ball.  Again it just to me felt a little bit looser than I would like it to be, so went with a +3 ball that was excellent in range of motion and stability.  A +3 ball was opened and placed 36 mm, and the hip was reduced.  Excellent range of motion and stability checked in extension and external rotation, and flexion and internal rotation stable in all these directions.  A short external rotator piriformis was then repaired to the posterior intertrochanteric line through drill holes.  The tensor fascia was closed with 1 Vicryl running, skin with 0 and 2-0 Vicryl, and skin staples.  Sterile compressive dressing was applied.  The patient was taken to the recovery room in  satisfactory condition.  Estimated blood loss for the procedure was 500 mL.     Harvie Junior, M.D.     Ranae Plumber  D:  01/06/2013  T:  01/07/2013  Job:  [id number]

## 2013-01-07 NOTE — Evaluation (Signed)
Occupational Therapy Evaluation Patient Details Name: Marciano Mundt MRN: [id number] DOB: 1935/03/30 Today's Date: 01/07/2013 Time: 7829-5621 OT Time Calculation (min): 34 min  OT Assessment / Plan / Recommendation History of present illness S/p elective Lt THA    Clinical Impression   Pt presents with below problem list. Pt will benefit from acute OT to increase independence and safety prior to d/c.     OT Assessment  Patient needs continued OT Services    Follow Up Recommendations  Home health OT;Supervision/Assistance - 24 hour    Barriers to Discharge      Equipment Recommendations  None recommended by OT    Recommendations for Other Services    Frequency  Min 2X/week    Precautions / Restrictions Precautions Precautions: Posterior Hip;Fall Precaution Booklet Issued: No Precaution Comments: Reviewed hip precautions Restrictions Weight Bearing Restrictions: Yes LLE Weight Bearing: Weight bearing as tolerated   Pertinent Vitals/Pain No pain reported.     ADL  Grooming: Set up Where Assessed - Grooming: Supported sitting Upper Body Dressing: Set up Where Assessed - Upper Body Dressing: Supported sitting Lower Body Dressing: Moderate assistance;Other (comment) (with AE) Where Assessed - Lower Body Dressing: Supported sit to Pharmacist, hospital: +2 Total assistance Toilet Transfer: Patient Percentage: 50% Toilet Transfer Method: Sit to Barista: Comfort height toilet;Grab bars Toileting - Clothing Manipulation and Hygiene: Moderate assistance Where Assessed - Toileting Clothing Manipulation and Hygiene: Sit to stand from 3-in-1 or toilet Tub/Shower Transfer Method: Not assessed Equipment Used: Gait belt;Rolling walker;Reacher;Long-handled sponge;Long-handled shoe horn;Sock aid Transfers/Ambulation Related to ADLs: Cues for technique with ambulation. +2 Total A/Min A/Mod A for transfers. ADL Comments: Practiced with AE for LB ADLs-pt states at  end of session that wife will assist him with this. Discussed shower transfer techniques. Pt having difficulty for toilet transfer from comfort height with grab bar. Recommended using 3 in 1 at home.  Educated to stand in front of chair/bed with walker in front when pulling up LB clothing and told wife to be with him. Educated on dressing technique.    OT Diagnosis: Acute pain;Generalized weakness  OT Problem List: Decreased strength;Decreased activity tolerance;Impaired balance (sitting and/or standing);Decreased knowledge of use of DME or AE;Decreased knowledge of precautions;Pain OT Treatment Interventions: Self-care/ADL training;DME and/or AE instruction;Patient/family education;Balance training;Therapeutic activities   OT Goals(Current goals can be found in the care plan section) Acute Rehab OT Goals Patient Stated Goal: get out of here OT Goal Formulation: With patient/family Time For Goal Achievement: 01/14/13 Potential to Achieve Goals: Good ADL Goals Pt Will Transfer to Toilet: with modified independence;ambulating;bedside commode Pt Will Perform Toileting - Clothing Manipulation and hygiene: sit to/from stand;with modified independence;sitting/lateral leans Pt Will Perform Tub/Shower Transfer: Shower transfer;with supervision;ambulating;3 in 1;rolling walker  Visit Information  Last OT Received On: 01/07/13 Assistance Needed: +1 History of Present Illness: S/p elective Lt THA        Prior Functioning     Home Living Family/patient expects to be discharged to:: Private residence Living Arrangements: Spouse/significant other Available Help at Discharge: Family;Available 24 hours/day Type of Home: House Home Access: Level entry Home Layout: Two level;Able to live on main level with bedroom/bathroom Home Equipment: Walker - 4 wheels;Walker - 2 wheels;Bedside commode;Shower seat;Hand held shower head Prior Function Level of Independence: Needs assistance Gait / Transfers  Assistance Needed: pt has been limiting in ambulation due to pain and weakness; would sit on rolator and roll throughout the house  ADL's / Homemaking Assistance Needed: pt requires (A)  from wife to dress LEs and take a shower Comments: per wife and pt; pt had declined in funtional independence in the past few months due to recent surgeries and Lt hip pain Communication Communication: No difficulties         Vision/Perception     Cognition  Cognition Arousal/Alertness: Awake/alert Behavior During Therapy: WFL for tasks assessed/performed Overall Cognitive Status: Within Functional Limits for tasks assessed    Extremity/Trunk Assessment Upper Extremity Assessment Upper Extremity Assessment: Overall WFL for tasks assessed Lower Extremity Assessment Lower Extremity Assessment: Defer to PT evaluation     Mobility Bed Mobility Bed Mobility: Not assessed Transfers Transfers: Sit to Stand;Stand to Sit Sit to Stand: 4: Min assist;3: Mod assist;1: +2 Total assist;With upper extremity assist;From chair/3-in-1;From toilet Sit to Stand: Patient Percentage: 50% Stand to Sit: 1: +2 Total assist;To chair/3-in-1;To toilet;3: Mod assist Stand to Sit: Patient Percentage: 50% Details for Transfer Assistance: Initially Mod A to stand from recliner chair, but did not achieve full standing position and sat back down. Pt then Min A to stand from chair. Pt required +2 Total A (50%) for sit <> stand to/from toilet. Cues for positioning of LLE.     Exercise     Balance     End of Session OT - End of Session Equipment Utilized During Treatment: Gait belt;Rolling walker Activity Tolerance: Patient tolerated treatment well Patient left: with call bell/phone within reach;in chair;with family/visitor present  GO     Earlie Raveling OTR/L [phone] 01/07/2013, 3:10 PM

## 2013-01-07 NOTE — Progress Notes (Signed)
Physical Therapy Treatment Patient Details Name: Steven Patton MRN: [id number] DOB: 1935/11/12 Today's Date: 01/07/2013 Time: 4098-1191 PT Time Calculation (min): 18 min  PT Assessment / Plan / Recommendation  History of Present Illness S/p elective Lt THA    PT Comments   Pt making progress with mobility but cont's to be limited by decreased strength.  Pt states pain is controlled at this time.  Cont with current POC to maximize functional mobility & safety prior to d/c home.     Follow Up Recommendations  Home health PT;Supervision/Assistance - 24 hour     Does the patient have the potential to tolerate intense rehabilitation     Barriers to Discharge        Equipment Recommendations  None recommended by PT    Recommendations for Other Services OT consult  Frequency 7X/week   Progress towards PT Goals Progress towards PT goals: Progressing toward goals  Plan Current plan remains appropriate    Precautions / Restrictions Precautions Precautions: Posterior Hip;Fall Precaution Comments: reviewed hip precautions Required Braces or Orthoses: Knee Immobilizer - Left Restrictions Weight Bearing Restrictions: Yes LLE Weight Bearing: Weight bearing as tolerated   Pertinent Vitals/Pain "I dont' have any"    Mobility  Bed Mobility Bed Mobility: Not assessed Transfers Transfers: Sit to Stand;Stand to Sit Sit to Stand: With upper extremity assist;With armrests;From chair/3-in-1;4: Min assist Stand to Sit: 4: Min guard;With upper extremity assist;With armrests;To chair/3-in-1 Details for Transfer Assistance: cues to reinforce hand placement Ambulation/Gait Ambulation/Gait Assistance: 4: Min guard Ambulation Distance (Feet): 25 Feet Assistive device: Rolling walker Ambulation/Gait Assistance Details: cues for sequencing, posture, use of UE's on RW.  As distance increases pt's Lt knee begins to buckle  Gait Pattern: Step-to pattern;Decreased step length - right;Decreased step  length - left;Decreased weight shift to left;Trunk flexed Gait velocity: decreased Stairs: No (states he doesn't have any) Wheelchair Mobility Wheelchair Mobility: No    Exercises Total Joint Exercises Ankle Circles/Pumps: AROM;Both;10 reps Gluteal Sets: AROM;Strengthening;Both;10 reps Heel Slides: AROM;Strengthening;Left;10 reps Long Arc Quad: AROM;Strengthening;Left;10 reps Marching in Standing: AROM;Strengthening;Left;10 reps;Seated     PT Goals (current goals can now be found in the care plan section) Acute Rehab PT Goals Patient Stated Goal: to go home sunday PT Goal Formulation: With patient Time For Goal Achievement: 01/13/13 Potential to Achieve Goals: Good  Visit Information  Last PT Received On: 01/07/13 Assistance Needed: +1 History of Present Illness: S/p elective Lt THA     Subjective Data  Patient Stated Goal: to go home sunday   Cognition  Cognition Arousal/Alertness: Awake/alert Behavior During Therapy: WFL for tasks assessed/performed Overall Cognitive Status: Within Functional Limits for tasks assessed    Balance     End of Session PT - End of Session Activity Tolerance: Patient tolerated treatment well Patient left: in chair;with call bell/phone within reach;with family/visitor present Nurse Communication: Mobility status   GP     Lara Mulch 01/07/2013, 9:14 AM   Verdell Face, PTA [phone] 01/07/2013

## 2013-01-07 NOTE — Progress Notes (Signed)
Physical Therapy Treatment Patient Details Name: Steven Patton MRN: [id number] DOB: 09/28/1935 Today's Date: 01/07/2013 Time: 4098-1191 PT Time Calculation (min): 17 min  PT Assessment / Plan / Recommendation  History of Present Illness S/p elective Lt THA    PT Comments   Pt. With persistent weakness and buckling of L LE during ambulation which increases likelihood of a fall.  Pt. Will need to improve significantly Sunday with PT for him to be able to safely be able to Dc home.  He may benefit from remaining in house till Monday for additional sessions, but can update this recommendation tomorrow after first PT session.  Follow Up Recommendations  Home health PT;Supervision/Assistance - 24 hour;Supervision for mobility/OOB     Does the patient have the potential to tolerate intense rehabilitation     Barriers to Discharge        Equipment Recommendations  None recommended by PT    Recommendations for Other Services OT consult  Frequency 7X/week   Progress towards PT Goals Progress towards PT goals: Progressing toward goals  Plan Current plan remains appropriate    Precautions / Restrictions Precautions Precautions: Posterior Hip;Fall Precaution Booklet Issued: No Precaution Comments: Reviewed hip precautions Required Braces or Orthoses: Knee Immobilizer - Left (no MD orders in chart but KI in room) Knee Immobilizer - Left: Other (comment) (no specific orders for wearing KI) Restrictions Weight Bearing Restrictions: Yes LLE Weight Bearing: Weight bearing as tolerated   Pertinent Vitals/Pain See vitals tab     Mobility  Bed Mobility Bed Mobility: Not assessed (pt. in recliner) Transfers Transfers: Sit to Stand;Stand to Sit Sit to Stand: 3: Mod assist;With upper extremity assist;With armrests;From chair/3-in-1 Sit to Stand: Patient Percentage: 50% Stand to Sit: 3: Mod assist;With upper extremity assist;With armrests;To chair/3-in-1 Stand to Sit: Patient Percentage:  50% Details for Transfer Assistance: needing mod assist to rise to stand and to control descent.  Pt. attempts to initiate but unable to produce enough force on his own to allow him to rise to stand Ambulation/Gait Ambulation/Gait Assistance: 4: Min assist Ambulation Distance (Feet): 25 Feet Assistive device: Rolling walker Ambulation/Gait Assistance Details: postural cues; noted buckling of L knee throughout much of his walking distance of 25 feet.  Needed min assist to insure safety and stability Gait Pattern: Step-to pattern;Decreased step length - right;Decreased step length - left;Decreased weight shift to left;Trunk flexed Gait velocity: decreased    Exercises     PT Diagnosis:    PT Problem List:   PT Treatment Interventions:     PT Goals (current goals can now be found in the care plan section) Acute Rehab PT Goals Patient Stated Goal: get out of here  Visit Information  Last PT Received On: 01/07/13 Assistance Needed: +1 History of Present Illness: S/p elective Lt THA     Subjective Data  Subjective: Wife and pt. voice concern that pt. may not be ready for DC home tomorrow due to continued weakness in ambulation Patient Stated Goal: get out of here   Cognition  Cognition Arousal/Alertness: Awake/alert Behavior During Therapy: WFL for tasks assessed/performed Overall Cognitive Status: Within Functional Limits for tasks assessed    Balance     End of Session PT - End of Session Equipment Utilized During Treatment: Gait belt;Left knee immobilizer Activity Tolerance: Patient tolerated treatment well;Patient limited by fatigue Patient left: in chair;with call bell/phone within reach;with family/visitor present Nurse Communication: Mobility status   GP     Ferman Hamming 01/07/2013, 3:30 PM Weldon Picking  PT Acute Rehab Services [phone] [place] [phone]

## 2013-01-08 LAB — CBC
HCT: 28.6 % — ABNORMAL LOW (ref 39.0–52.0)
Hemoglobin: 9.3 g/dL — ABNORMAL LOW (ref 13.0–17.0)
MCH: 29.3 pg (ref 26.0–34.0)
MCHC: 32.5 g/dL (ref 30.0–36.0)
MCV: 90.2 fL (ref 78.0–100.0)
Platelets: 111 10*3/uL — ABNORMAL LOW (ref 150–400)
RBC: 3.17 MIL/uL — ABNORMAL LOW (ref 4.22–5.81)
RDW: 13.9 % (ref 11.5–15.5)
WBC: 12.1 10*3/uL — ABNORMAL HIGH (ref 4.0–10.5)

## 2013-01-08 MED ORDER — OXYCODONE-ACETAMINOPHEN 5-325 MG PO TABS: 1.0000 | ORAL_TABLET | Freq: Four times a day (QID) | ORAL | Status: DC | PRN

## 2013-01-08 MED ORDER — ASPIRIN 325 MG PO TBEC: 325.0000 mg | DELAYED_RELEASE_TABLET | Freq: Two times a day (BID) | ORAL | Status: DC

## 2013-01-08 MED ORDER — TIZANIDINE HCL 4 MG PO TABS: 4.0000 mg | ORAL_TABLET | Freq: Three times a day (TID) | ORAL | Status: DC | PRN

## 2013-01-08 NOTE — Progress Notes (Signed)
Pt discharged to home. D/c instructions given. No questions verbalized. Vitals stable. 

## 2013-01-08 NOTE — Progress Notes (Signed)
Physical Therapy Treatment Patient Details Name: Steven Patton MRN: [id number] DOB: [birth year] Today's Date: 01/08/2013 Time: 6962-9528 PT Time Calculation (min): 23 min  PT Assessment / Plan / Recommendation  History of Present Illness S/p elective Lt THA    PT Comments   Pt progressing with mobility & PT goals this session.  Able to achieve standing with decreased assistance & ambulate ~45' with RW.  Ambulated with KI (KI in pt's room although no MD order in chart) to increase stability & safety due to Lt knee buckling during ambulation in previous sessions.  Pt & wife report furthest distance he has been ambulating since after knee surgery which was recent has been only ~5-10' with use of RW/rollator.     Follow Up Recommendations  Home health PT;Supervision/Assistance - 24 hour;Supervision for mobility/OOB     Does the patient have the potential to tolerate intense rehabilitation     Barriers to Discharge        Equipment Recommendations  None recommended by PT    Recommendations for Other Services    Frequency 7X/week   Progress towards PT Goals Progress towards PT goals: Progressing toward goals  Plan Current plan remains appropriate    Precautions / Restrictions Precautions Precautions: Posterior Hip;Fall Precaution Comments: Pt able to recall 3/3 hip precautions Required Braces or Orthoses: Knee Immobilizer - Left (no MD orders in chart but KI in room) Knee Immobilizer - Left: Other (comment) (no specific orders for wearing KI ) Restrictions LLE Weight Bearing: Weight bearing as tolerated   Pertinent Vitals/Pain "I'm not having any"    Mobility  Bed Mobility Bed Mobility: Not assessed Transfers Transfers: Sit to Stand;Stand to Sit Sit to Stand: 4: Min guard;With upper extremity assist;With armrests;From chair/3-in-1 Stand to Sit: 4: Min guard;With upper extremity assist;With armrests;To chair/3-in-1 Details for Transfer Assistance: Pt demonstrated safe hand  placement Ambulation/Gait Ambulation/Gait Assistance: 4: Min guard Ambulation Distance (Feet): 45 Feet Assistive device: Rolling walker Ambulation/Gait Assistance Details: cues for posture, & encouragement to increase LLE WBing.  Ambulated with KI due to Lt knee buckling in previous PT sessions.   Gait Pattern: Step-to pattern;Decreased step length - right;Decreased step length - left;Decreased weight shift to left Gait velocity: decreased Stairs: No Wheelchair Mobility Wheelchair Mobility: No    Exercises Total Joint Exercises Ankle Circles/Pumps: AROM;Both;10 reps Heel Slides: AROM;Strengthening;Left;10 reps Hip ABduction/ADduction: AAROM;Strengthening;Left;10 reps Straight Leg Raises: AAROM;Strengthening;Left;10 reps     PT Goals (current goals can now be found in the care plan section) Acute Rehab PT Goals PT Goal Formulation: With patient Time For Goal Achievement: 01/13/13 Potential to Achieve Goals: Good  Visit Information  Last PT Received On: 01/08/13 Assistance Needed: +1 History of Present Illness: S/p elective Lt THA     Subjective Data      Cognition  Cognition Behavior During Therapy: [facility] for tasks assessed/performed Overall Cognitive Status: Within Functional Limits for tasks assessed    Balance     End of Session PT - End of Session Equipment Utilized During Treatment: Gait belt;Left knee immobilizer Activity Tolerance: Patient tolerated treatment well Patient left: in chair;with call bell/phone within reach;with family/visitor present Nurse Communication: Mobility status   GP     Lara Mulch 01/08/2013, 8:16 AM   Verdell Face, PTA [phone] 01/08/2013

## 2013-01-08 NOTE — Discharge Summary (Signed)
Patient ID: Kofi Murrell MRN: [id number] DOB/AGE: 75-Jun-1937 75 y.o.  Admit date: 01/06/2013 Discharge date: 01/08/2013  Admission Diagnoses:  Principal Problem:   Osteoarthritis of left hip Active Problems:   Morbid obesity   Discharge Diagnoses:  Same  Past Medical History  Diagnosis Date  . Cataract   . Macular degeneration, left eye   . Arthritis   . Sleep apnea     2008.Marland KitchenMarland KitchenWEARS CPAP  . Hypertension     takes Amlodipine,Lisinopril,and Metoprolol daily    Surgeries: Procedure(s): LEFT TOTAL HIP ARTHROPLASTY-Posterior approach on 01/06/2013   Discharged Condition: Improved  Hospital Course: Devaughn Savant is an 77 y.o. male who was admitted 01/06/2013 for operative treatment ofOsteoarthritis of left hip. Patient has severe unremitting pain that affects sleep, daily activities, and work/hobbies. After pre-op clearance the patient was taken to the operating room on 01/06/2013 and underwent  Procedure(s): LEFT TOTAL HIP ARTHROPLASTY-Posterior approach.    Patient was given perioperative antibiotics: Anti-infectives   Start     Dose/Rate Route Frequency Ordered Stop   01/06/13 1200  ceFAZolin (ANCEF) IVPB 2 g/50 mL premix     2 g 100 mL/hr over 30 Minutes Intravenous Every 6 hours 01/06/13 1115 01/06/13 1759   01/06/13 0600  ceFAZolin (ANCEF) IVPB 2 g/50 mL premix  Status:  Discontinued     2 g 100 mL/hr over 30 Minutes Intravenous On call to O.R. 01/05/13 1450 01/06/13 0546   01/06/13 0600  ceFAZolin (ANCEF) 3 g in dextrose 5 % 50 mL IVPB     3 g 160 mL/hr over 30 Minutes Intravenous On call to O.R. 01/06/13 0546 01/06/13 0736       Patient was given sequential compression devices, early ambulation, and chemoprophylaxis to prevent DVT.  Patient benefited maximally from hospital stay and there were no complications.    Recent vital signs: Patient Vitals for the past 24 hrs:  BP Temp Temp src Pulse Resp SpO2  01/08/13 0929 126/69 mmHg - - 88 - -  01/08/13 0626 111/57  mmHg 98.3 F (36.8 C) Oral 73 18 99 %  01/07/13 2340 - - - 92 20 94 %  01/07/13 2026 120/52 mmHg 99.9 F (37.7 C) Oral 97 18 93 %  01/07/13 1457 114/53 mmHg 99.8 F (37.7 C) Oral 79 18 93 %     Recent laboratory studies:  Recent Labs  01/06/13 0912 01/07/13 0357 01/08/13 0305  WBC  --  11.8* 12.1*  HGB 11.6* 10.5* 9.3*  HCT 34.0* 31.6* 28.6*  PLT  --  121* 111*  NA 141 138  --   K 3.3* 4.2  --   CL  --  102  --   CO2  --  26  --   BUN  --  14  --   CREATININE  --  1.00  --   GLUCOSE 114* 121*  --   CALCIUM  --  8.4  --      Discharge Medications:     Medication List    STOP taking these medications       oxyCODONE-acetaminophen 10-325 MG per tablet  Commonly known as:  PERCOCET  Replaced by:  oxyCODONE-acetaminophen 5-325 MG per tablet      TAKE these medications       amLODipine 10 MG tablet  Commonly known as:  NORVASC  Take 10 mg by mouth daily.     aspirin 325 MG EC tablet  Take 1 tablet (325 mg total) by mouth 2 (two) times daily  after a meal.     diazepam 5 MG tablet  Commonly known as:  VALIUM  Take 5 mg by mouth at bedtime as needed for anxiety.     diphenhydrAMINE 25 mg capsule  Commonly known as:  BENADRYL  Take 50 mg by mouth at bedtime as needed for sleep.     FISH OIL PO  Take 1 capsule by mouth daily.     gabapentin 600 MG tablet  Commonly known as:  NEURONTIN  Take 600 mg by mouth 3 (three) times daily.     lisinopril-hydrochlorothiazide 20-25 MG per tablet  Commonly known as:  PRINZIDE,ZESTORETIC  Take 1 tablet by mouth daily.     metoprolol 50 MG tablet  Commonly known as:  LOPRESSOR  Take 50 mg by mouth 2 (two) times daily.     nitroGLYCERIN 0.4 MG SL tablet  Commonly known as:  NITROSTAT  Place 0.4 mg under the tongue every 5 (five) minutes as needed for chest pain.     oxyCODONE-acetaminophen 5-325 MG per tablet  Commonly known as:  PERCOCET/ROXICET  Take 1-2 tablets by mouth every 6 (six) hours as needed for severe  pain.     tiZANidine 4 MG tablet  Commonly known as:  ZANAFLEX  Take 1 tablet (4 mg total) by mouth every 8 (eight) hours as needed for muscle spasms.        Diagnostic Studies: Dg Pelvis Portable  01/06/2013   CLINICAL DATA:  Postop left total hip replacement  EXAM: PORTABLE PELVIS 1-2 VIEWS  COMPARISON:  Cross-table lateral radiograph of the left hip - earlier same day.  FINDINGS: This examination is interpreted in conjunction with cross-table lateral radiograph performed earlier same day.  Post left total hip replacement. Alignment appears near anatomic. No definite fracture or dislocation. No evidence of hardware failure or loosening. There is a minimal amount of expected subcutaneous emphysema about the operative site. Skin staples overlie the operative site. No radiopaque foreign body.  IMPRESSION: Post left total hip replacement without evidence of complication.   Electronically Signed   By: Simonne Come M.D.   On: 01/06/2013 11:30   Dg Hip Portable 1 View Left  01/06/2013   CLINICAL DATA:  Postop left total hip replacement  EXAM: PORTABLE LEFT HIP - 1 VIEW  COMPARISON:  AP pelvis radiographs-earlier same day  FINDINGS: This examination is interpreted in conjunction with AP pelvis radiographs performed earlier same day.  Post left total hip replacement. Alignment appears near anatomic. No definite fracture or dislocation. No evidence of hardware failure or loosening. There is a minimal amount of expected subcutaneous emphysema about the operative site. Skin staples overlie the operative site. No radiopaque foreign body.  IMPRESSION: Post left total hip replacement without evidence of complication   Electronically Signed   By: Simonne Come M.D.   On: 01/06/2013 11:31    Disposition: 01-Home or Self Care      Discharge Orders   Future Appointments Provider Department Dept Phone   03/24/2013 11:00 AM Monica Becton, MD [facility] AT [facility] [place] [phone]    05/16/2013 2:00 PM Monica Becton, MD [facility] PRIMARY CARE AT [facility] [place] [phone]   Future Orders Complete By Expires   Call MD / Call 911  As directed    Comments:     If you experience chest pain or shortness of breath, CALL 911 and be transported to the hospital emergency room.  If you develope a fever above 101 F,  pus (white drainage) or increased drainage or redness at the wound, or calf pain, call your surgeon's office.   Constipation Prevention  As directed    Comments:     Drink plenty of fluids.  Prune juice may be helpful.  You may use a stool softener, such as Colace (over the counter) 100 mg twice a day.  Use MiraLax (over the counter) for constipation as needed.   Diet general  As directed    Follow the hip precautions as taught in Physical Therapy  As directed    Increase activity slowly as tolerated  As directed    Weight bearing as tolerated  As directed    Questions:     Laterality:     Extremity:        Follow-up Information   Follow up with GRAVES,JOHN L, MD. Schedule an appointment as soon as possible for a visit in 2 weeks.   Specialty:  Orthopedic Surgery   Contact information:   [address] [place] [address] [phone]        Signed: Matthew Folks 01/08/2013, 10:47 AM

## 2013-01-08 NOTE — Progress Notes (Signed)
Occupational Therapy Treatment Patient Details Name: Steven Patton MRN: [id number] DOB: 01-Jun-1935 Today's Date: 01/08/2013 Time: 6578-4696 OT Time Calculation (min): 26 min  OT Assessment / Plan / Recommendation  History of present illness S/p elective Lt THA    OT comments  Pt progressing towards goals. Education provided to pt and spouse. Used Knee Immobilizer during session as pt states he feels more comfortable with it and OT was notified his knee has been buckling.   Follow Up Recommendations  Home health OT;Supervision/Assistance - 24 hour    Barriers to Discharge       Equipment Recommendations  None recommended by OT    Recommendations for Other Services    Frequency Min 2X/week   Progress towards OT Goals Progress towards OT goals: Progressing toward goals  Plan Discharge plan remains appropriate    Precautions / Restrictions Precautions Precautions: Posterior Hip;Fall Precaution Booklet Issued: No Precaution Comments: Pt able to recall 3/3 hip precautions Required Braces or Orthoses: Knee Immobilizer - Left (no MD orders but KI in room) Knee Immobilizer - Left: Other (comment) (no specific orders for wearing KI) Restrictions Weight Bearing Restrictions: Yes LLE Weight Bearing: Weight bearing as tolerated   Pertinent Vitals/Pain No pain reported.     ADL  Upper Body Dressing: Moderate assistance Where Assessed - Upper Body Dressing: Supported standing Lower Body Dressing: Maximal assistance Where Assessed - Lower Body Dressing: Supported sit to Pharmacist, hospital: Minimal assistance;Min Pension scheme manager Method: Sit to stand (Min guard-sit to stand and Min A-stand to sit) Acupuncturist: Comfort height toilet;Grab bars Tub/Shower Transfer: Minimal assistance Tub/Shower Transfer Method: Science writer: Walk in shower;Other (comment) (3 in 1) Equipment Used: Gait belt;Knee Immobilizer;Rolling  walker Transfers/Ambulation Related to ADLs: Min guard for ambulation and Min guard/Min A for transfers ADL Comments: Spouse assisted with LB/UB dressing. Recommended sitting to get shirt on as pt was losing balance slightly. Practiced shower transfer and pt ambulated short distance in hallway. Practiced bed mobility with wife.  Educated on use of bag on walker, having rugs picked up. safe shoe wear, and standing in front of chair/bed with walker in front when pulling up LB clothing.  Recommended spouse being with pt 24/7. Educated on dressing technique. OT explained that long handled sponge may be handy for pt to help assist with bathing.    OT Diagnosis:    OT Problem List:   OT Treatment Interventions:     OT Goals(current goals can now be found in the care plan section) Acute Rehab OT Goals Patient Stated Goal: go home OT Goal Formulation: With patient/family Time For Goal Achievement: 01/14/13 Potential to Achieve Goals: Good ADL Goals Pt Will Perform Lower Body Dressing: with modified independence;with caregiver independent in assisting;sit to/from stand Pt Will Transfer to Toilet: with modified independence;ambulating;bedside commode Pt Will Perform Toileting - Clothing Manipulation and hygiene: sit to/from stand;with modified independence;sitting/lateral leans Pt Will Perform Tub/Shower Transfer: Shower transfer;with supervision;ambulating;3 in 1;rolling walker  Visit Information  Last OT Received On: 01/08/13 Assistance Needed: +1 History of Present Illness: S/p elective Lt THA     Subjective Data      Prior Functioning  Home Living Family/patient expects to be discharged to:: Private residence Living Arrangements: Spouse/significant other    Cognition  Cognition Arousal/Alertness: Awake/alert Behavior During Therapy: WFL for tasks assessed/performed Overall Cognitive Status: Within Functional Limits for tasks assessed    Mobility  Bed Mobility Bed Mobility: Sit to  Supine;Supine to Sit;Sitting - Scoot to [place]  of Bed Supine to Sit: 4: Min assist;With rails;HOB flat Sitting - Scoot to Edge of Bed: 4: Min assist Sit to Supine: 3: Mod assist;With rail;HOB flat (wife assisted with both LE's) Details for Bed Mobility Assistance: Wife held LLE as he scooted to EOB. Assistance with LLE when getting in and out of bed. Educated to not hook Rt foot on left foot to assist to avoid breaking precautions. Pt has rail at home he can use Transfers Transfers: Sit to Stand;Stand to Sit Sit to Stand: 4: Min guard;With upper extremity assist;From chair/3-in-1;From toilet;From bed Stand to Sit: 4: Min guard;4: Min assist;To bed;To chair/3-in-1;To toilet;With upper extremity assist Details for Transfer Assistance: Cues to control descent. Cues for placement of LLE. Min A for stand to sit to toilet.       Balance     End of Session OT - End of Session Equipment Utilized During Treatment: Gait belt;Rolling walker;Left knee immobilizer Activity Tolerance: Patient tolerated treatment well Patient left: in chair;with call bell/phone within reach;with family/visitor present  GO     Steven Patton  OTR/L [phone]  01/08/2013, 10:28 AM

## 2013-01-08 NOTE — Care Management Note (Signed)
    Page 1 of 1   01/08/2013     1:35:56 PM   CARE MANAGEMENT NOTE 01/08/2013  Patient:  JESSI, JESSOP   Account Number:  [id number]  Date Initiated:  01/06/2013  Documentation initiated by:  Vance Peper  Subjective/Objective Assessment:   77 yr old male s/p left total hip arthtroplasty.     Action/Plan:   PT/OT eval   Anticipated DC Date:  01/08/2013   Anticipated DC Plan:  HOME W HOME HEALTH SERVICES         Choice offered to / List presented to:             Status of service:  Completed, signed off Medicare Important Message given?   (If response is "NO", the following Medicare IM given date fields will be blank) Date Medicare IM given:   Date Additional Medicare IM given:    Discharge Disposition:  HOME/SELF CARE  Per UR Regulation:    If discussed at Long Length of Stay Meetings, dates discussed:    Comments:  01/08/13 13:00 CM spoke with Marshia Ly, PA to clarify [facility] needs for pt who discharged with no HH orders in system. Pt may have had HH arranged in MD office Luiz Blare, John) prior to surgery but PA states it is unclear if arrangements were made.  PA states MD office will follow up on Monday to see if arranged prior to surgery and will arrange from office at this time.  Freddy Jaksch, BSN, CM [phone].

## 2013-01-08 NOTE — Progress Notes (Signed)
Subjective: 2 Days Post-Op Procedure(s) (LRB): LEFT TOTAL HIP ARTHROPLASTY-Posterior approach (Left) Patient reports pain as 3 on 0-10 scale.   Doing great. Up in chair. Taking po/voiding ok.  Objective: Vital signs in last 24 hours: Temp:  [98.3 F (36.8 C)-99.9 F (37.7 C)] 98.3 F (36.8 C) (12/07 0626) Pulse Rate:  [73-97] 88 (12/07 0929) Resp:  [18-20] 18 (12/07 0626) BP: (111-126)/(52-69) 126/69 mmHg (12/07 0929) SpO2:  [93 %-99 %] 99 % (12/07 0626)  Intake/Output from previous day: 12/06 0701 - 12/07 0700 In: 3029.3 [P.O.:840; I.V.:2189.3] Out: 550 [Urine:550] Intake/Output this shift: Total I/O In: 320 [P.O.:320] Out: 500 [Urine:500]   Recent Labs  01/06/13 0912 01/07/13 0357 01/08/13 0305  HGB 11.6* 10.5* 9.3*    Recent Labs  01/07/13 0357 01/08/13 0305  WBC 11.8* 12.1*  RBC 3.59* 3.17*  HCT 31.6* 28.6*  PLT 121* 111*    Recent Labs  01/06/13 0912 01/07/13 0357  NA 141 138  K 3.3* 4.2  CL  --  102  CO2  --  26  BUN  --  14  CREATININE  --  1.00  GLUCOSE 114* 121*  CALCIUM  --  8.4   Left hip exam: Neurologically intact Neurovascular intact Sensation intact distally Intact pulses distally Dorsiflexion/Plantar flexion intact Incision: dressing C/D/I Compartment soft  Assessment/Plan: 2 Days Post-Op Procedure(s) (LRB): LEFT TOTAL HIP ARTHROPLASTY-Posterior approach (Left) Plan: Discharge home with home health  Steven Patton 01/08/2013, 10:37 AM

## 2013-01-10 ENCOUNTER — Encounter (HOSPITAL_COMMUNITY): Payer: Self-pay | Admitting: Orthopedic Surgery

## 2013-01-18 ENCOUNTER — Telehealth: Payer: Self-pay

## 2013-01-18 NOTE — Telephone Encounter (Signed)
Patient called stated that he has been having a home health physcial therapy coming by to help him and that stated that his blood pressure was running low I asked him what his reading were and he stated 109/60, and 98/59. Kaleiyah Polsky,CMA

## 2013-01-19 NOTE — Telephone Encounter (Signed)
Cut lisinopril/HCTZ in half for now, the actual BP number is not important if low but whats important is how is he feeling?  Dizzy? Lightheaded? Or fine?

## 2013-01-19 NOTE — Telephone Encounter (Signed)
Spoke to patient advised him to cut lisinopril-HTCZ in half. Patient did deny dizzy or lightheadedness but he stated that he has been very tired since he came home from the hospital from his hip surgery. Rhonda Cunningham,CMA

## 2013-03-24 ENCOUNTER — Ambulatory Visit (INDEPENDENT_AMBULATORY_CARE_PROVIDER_SITE_OTHER): Payer: Medicare PPO | Admitting: Sports Medicine

## 2013-03-24 ENCOUNTER — Encounter: Payer: Self-pay | Admitting: Sports Medicine

## 2013-03-24 VITALS — BP 148/79 | HR 60 | Ht 69.0 in | Wt 284.0 lb

## 2013-03-24 DIAGNOSIS — M48062 Spinal stenosis, lumbar region with neurogenic claudication: Secondary | ICD-10-CM

## 2013-03-24 DIAGNOSIS — IMO0002 Reserved for concepts with insufficient information to code with codable children: Secondary | ICD-10-CM

## 2013-03-24 DIAGNOSIS — E669 Obesity, unspecified: Secondary | ICD-10-CM

## 2013-03-24 DIAGNOSIS — M1710 Unilateral primary osteoarthritis, unspecified knee: Secondary | ICD-10-CM

## 2013-03-24 DIAGNOSIS — D72829 Elevated white blood cell count, unspecified: Secondary | ICD-10-CM

## 2013-03-24 DIAGNOSIS — Z299 Encounter for prophylactic measures, unspecified: Secondary | ICD-10-CM

## 2013-03-24 DIAGNOSIS — L821 Other seborrheic keratosis: Secondary | ICD-10-CM

## 2013-03-24 DIAGNOSIS — M179 Osteoarthritis of knee, unspecified: Secondary | ICD-10-CM

## 2013-03-24 DIAGNOSIS — Z96649 Presence of unspecified artificial hip joint: Secondary | ICD-10-CM

## 2013-03-24 MED ORDER — PHENTERMINE HCL 37.5 MG PO CAPS: 37.5000 mg | ORAL_CAPSULE | ORAL | Status: DC

## 2013-03-24 NOTE — Assessment & Plan Note (Signed)
Restarting phentermine, he does have a recent negative cardiac catheterization.

## 2013-03-24 NOTE — Progress Notes (Signed)
  Subjective:    CC: Followup  HPI: Hypertension: Did not take his blood pressure medicine, desires routine blood work.  Lumbar spinal stenosis : symptoms resolved after surgery.  Facial lesions: Present for years, he has had cryotherapy in the past, desires repeat cryotherapy today.  Past medical history, Surgical history, Family history not pertinant except as noted below, Social history, Allergies, and medications have been entered into the medical record, reviewed, and no changes needed.   Review of Systems: No fevers, chills, night sweats, weight loss, chest pain, or shortness of breath.   Objective:    General: Well Developed, well nourished, and in no acute distress.  Neuro: Alert and oriented x3, extra-ocular muscles intact, sensation grossly intact.  HEENT: Normocephalic, atraumatic, pupils equal round reactive to light, neck supple, no masses, no lymphadenopathy, thyroid nonpalpable.  Skin: Warm and dry, multiple seborrheic keratoses present on the face. Cardiac: Regular rate and rhythm, no murmurs rubs or gallops, no lower extremity edema.  Respiratory: Clear to auscultation bilaterally. Not using accessory muscles, speaking in full sentences.  Procedure:  Cryodestruction of approximately 9 seborrheic keratoses on the face  Consent obtained and verified. Time-out conducted. Noted no overlying erythema, induration, or other signs of local infection. Completed without difficulty using Cryo-Gun. Advised to call if fevers/chills, erythema, induration, drainage, or persistent bleeding.  Impression and Recommendations:

## 2013-03-24 NOTE — Assessment & Plan Note (Signed)
Checking routine bloodwork. 

## 2013-03-24 NOTE — Assessment & Plan Note (Signed)
Continues to do well after surgery.

## 2013-03-24 NOTE — Assessment & Plan Note (Signed)
Doing well, he is going to schedule for right total hip arthroplasty in the near future.

## 2013-03-24 NOTE — Assessment & Plan Note (Signed)
Cryotherapy performed to multiple seborrheic keratoses on the face.

## 2013-03-24 NOTE — Assessment & Plan Note (Signed)
Rechecking CBC

## 2013-03-24 NOTE — Assessment & Plan Note (Signed)
Pain-free after knee replacement.

## 2013-03-25 LAB — COMPREHENSIVE METABOLIC PANEL
ALT: 12 U/L (ref 0–53)
AST: 17 U/L (ref 0–37)
Alkaline Phosphatase: 43 U/L (ref 39–117)
CO2: 27 mEq/L (ref 19–32)
Calcium: 9.2 mg/dL (ref 8.4–10.5)
Chloride: 104 mEq/L (ref 96–112)
Creat: 0.93 mg/dL (ref 0.50–1.35)
Sodium: 141 mEq/L (ref 135–145)
Total Bilirubin: 0.6 mg/dL (ref 0.2–1.2)
Total Protein: 6.8 g/dL (ref 6.0–8.3)

## 2013-03-25 LAB — CBC
HCT: 44.5 % (ref 39.0–52.0)
Hemoglobin: 14.9 g/dL (ref 13.0–17.0)
MCH: 28 pg (ref 26.0–34.0)
MCHC: 33.5 g/dL (ref 30.0–36.0)
MCV: 83.6 fL (ref 78.0–100.0)
Platelets: 147 10*3/uL — ABNORMAL LOW (ref 150–400)
RBC: 5.32 MIL/uL (ref 4.22–5.81)
RDW: 15.8 % — ABNORMAL HIGH (ref 11.5–15.5)
WBC: 9.6 10*3/uL (ref 4.0–10.5)

## 2013-03-25 LAB — LIPID PANEL
Cholesterol: 155 mg/dL (ref 0–200)
HDL: 41 mg/dL (ref >39)
LDL Cholesterol: 87 mg/dL (ref 0–99)
Total CHOL/HDL Ratio: 3.8 Ratio
Triglycerides: 136 mg/dL (ref <150)
VLDL: 27 mg/dL (ref 0–40)

## 2013-03-25 LAB — TSH: TSH: 1.668 u[IU]/mL (ref 0.350–4.500)

## 2013-03-25 LAB — COMPREHENSIVE METABOLIC PANEL WITH GFR
Albumin: 4.7 g/dL (ref 3.5–5.2)
BUN: 10 mg/dL (ref 6–23)
Glucose, Bld: 91 mg/dL (ref 70–99)
Potassium: 3.8 meq/L (ref 3.5–5.3)

## 2013-03-25 LAB — HEMOGLOBIN A1C
Hgb A1c MFr Bld: 5.7 % — ABNORMAL HIGH (ref <5.7)
Mean Plasma Glucose: 117 mg/dL — ABNORMAL HIGH (ref <117)

## 2013-04-06 ENCOUNTER — Other Ambulatory Visit: Payer: Self-pay | Admitting: Orthopedic Surgery

## 2013-04-06 ENCOUNTER — Encounter (HOSPITAL_COMMUNITY): Payer: Self-pay | Admitting: Pharmacy Technician

## 2013-04-07 ENCOUNTER — Ambulatory Visit (INDEPENDENT_AMBULATORY_CARE_PROVIDER_SITE_OTHER): Payer: Medicare PPO | Admitting: Sports Medicine

## 2013-04-07 ENCOUNTER — Encounter: Payer: Self-pay | Admitting: Sports Medicine

## 2013-04-07 VITALS — BP 155/87 | HR 65 | Ht 69.0 in | Wt 286.0 lb

## 2013-04-07 DIAGNOSIS — I10 Essential (primary) hypertension: Secondary | ICD-10-CM

## 2013-04-07 DIAGNOSIS — L821 Other seborrheic keratosis: Secondary | ICD-10-CM

## 2013-04-07 DIAGNOSIS — M48062 Spinal stenosis, lumbar region with neurogenic claudication: Secondary | ICD-10-CM

## 2013-04-07 MED ORDER — AMLODIPINE BESYLATE 10 MG PO TABS: 10.0000 mg | ORAL_TABLET | Freq: Every day | ORAL | Status: DC

## 2013-04-07 MED ORDER — LISINOPRIL-HYDROCHLOROTHIAZIDE 20-25 MG PO TABS: 1.0000 | ORAL_TABLET | Freq: Every day | ORAL | Status: DC

## 2013-04-07 MED ORDER — METOPROLOL TARTRATE 50 MG PO TABS: 50.0000 mg | ORAL_TABLET | Freq: Two times a day (BID) | ORAL | Status: DC

## 2013-04-07 MED ORDER — GABAPENTIN 600 MG PO TABS: 600.0000 mg | ORAL_TABLET | Freq: Three times a day (TID) | ORAL | Status: DC

## 2013-04-07 MED ORDER — CARVEDILOL 25 MG PO TABS: 12.5000 mg | ORAL_TABLET | Freq: Two times a day (BID) | ORAL | Status: DC

## 2013-04-07 MED ORDER — MELOXICAM 15 MG PO TABS: 15.0000 mg | ORAL_TABLET | Freq: Every day | ORAL | Status: DC

## 2013-04-07 NOTE — Progress Notes (Signed)
  Subjective:    CC: Followup  HPI: Hypertension: Currently taking Toprol 50 mg, amlodipine at 10, and maximal doses of lisinopril/hydrochlorothiazide. Blood pressure continues to be elevated.  Right hip osteoarthritis : scheduled for total hip arthroplasty at the end of this month.  Lumbar spinal stenosis with neurogenic claudication: Symptoms have resolved.  Seborrheic keratoses: Has approximately 15 lesions that he would like cryotherapy performed on, the previous lesions have improved significantly.  Past medical history, Surgical history, Family history not pertinant except as noted below, Social history, Allergies, and medications have been entered into the medical record, reviewed, and no changes needed.   Review of Systems: No fevers, chills, night sweats, weight loss, chest pain, or shortness of breath.   Objective:    General: Well Developed, well nourished, and in no acute distress.  Neuro: Alert and oriented x3, extra-ocular muscles intact, sensation grossly intact.  HEENT: Normocephalic, atraumatic, pupils equal round reactive to light, neck supple, no masses, no lymphadenopathy, thyroid nonpalpable.  Skin: Warm and dry, no rashes. Multiple seborrheic keratoses over the face. Cardiac: Regular rate and rhythm, no murmurs rubs or gallops, no lower extremity edema.  Respiratory: Clear to auscultation bilaterally. Not using accessory muscles, speaking in full sentences.  Procedure:  Cryodestruction of 15 cutaneous seborrheic keratoses Consent obtained and verified. Time-out conducted. Noted no overlying erythema, induration, or other signs of local infection. Completed without difficulty using Cryo-Gun. Advised to call if fevers/chills, erythema, induration, drainage, or persistent bleeding.  Impression and Recommendations:

## 2013-04-07 NOTE — Assessment & Plan Note (Signed)
Doing well with gabapentin.  

## 2013-04-07 NOTE — Assessment & Plan Note (Addendum)
Greatly improved from before. Repeat cryotherapy performed today 15 lesions.

## 2013-04-07 NOTE — Assessment & Plan Note (Signed)
Continues to be elevated. Switching metoprolol to carvedilol for further alpha blocker action.

## 2013-04-14 ENCOUNTER — Encounter (HOSPITAL_COMMUNITY)
Admission: RE | Admit: 2013-04-14 | Discharge: 2013-04-14 | Disposition: A | Discharge status: Home / Self Care | Payer: Medicare PPO | Source: Ambulatory Visit | Attending: Orthopedic Surgery | Admitting: Orthopedic Surgery

## 2013-04-14 ENCOUNTER — Ambulatory Visit (HOSPITAL_COMMUNITY)
Admission: RE | Admit: 2013-04-14 | Discharge: 2013-04-14 | Disposition: A | Discharge status: Home / Self Care | Payer: Medicare PPO | Source: Ambulatory Visit | Attending: Orthopedic Surgery | Admitting: Orthopedic Surgery

## 2013-04-14 ENCOUNTER — Encounter (HOSPITAL_COMMUNITY): Payer: Self-pay

## 2013-04-14 ENCOUNTER — Other Ambulatory Visit: Payer: Self-pay | Admitting: Sports Medicine

## 2013-04-14 ENCOUNTER — Telehealth: Payer: Self-pay

## 2013-04-14 DIAGNOSIS — Z01812 Encounter for preprocedural laboratory examination: Secondary | ICD-10-CM | POA: Insufficient documentation

## 2013-04-14 DIAGNOSIS — Z01818 Encounter for other preprocedural examination: Secondary | ICD-10-CM | POA: Insufficient documentation

## 2013-04-14 DIAGNOSIS — I10 Essential (primary) hypertension: Secondary | ICD-10-CM | POA: Insufficient documentation

## 2013-04-14 LAB — COMPREHENSIVE METABOLIC PANEL
ALT: 14 U/L (ref 0–53)
AST: 17 U/L (ref 0–37)
Albumin: 4 g/dL (ref 3.5–5.2)
Alkaline Phosphatase: 52 U/L (ref 39–117)
BILIRUBIN TOTAL: 0.5 mg/dL (ref 0.3–1.2)
BUN: 12 mg/dL (ref 6–23)
CHLORIDE: 100 meq/L (ref 96–112)
CO2: 27 meq/L (ref 19–32)
Calcium: 9.7 mg/dL (ref 8.4–10.5)
Creatinine, Ser: 1.08 mg/dL (ref 0.50–1.35)
GFR calc Af Amer: 75 mL/min — ABNORMAL LOW (ref >90)
GFR calc non Af Amer: 65 mL/min — ABNORMAL LOW (ref >90)
Glucose, Bld: 101 mg/dL — ABNORMAL HIGH (ref 70–99)
Potassium: 3.9 mEq/L (ref 3.7–5.3)
Sodium: 141 mEq/L (ref 137–147)
Total Protein: 7.2 g/dL (ref 6.0–8.3)

## 2013-04-14 LAB — CBC WITH DIFFERENTIAL/PLATELET
BASOS PCT: 1 % (ref 0–1)
Basophils Absolute: 0.1 10*3/uL (ref 0.0–0.1)
Eosinophils Absolute: 0.2 10*3/uL (ref 0.0–0.7)
Eosinophils Relative: 2 % (ref 0–5)
HEMATOCRIT: 48.4 % (ref 39.0–52.0)
Hemoglobin: 16.4 g/dL (ref 13.0–17.0)
Lymphocytes Relative: 28 % (ref 12–46)
Lymphs Abs: 2.9 10*3/uL (ref 0.7–4.0)
MCH: 28.9 pg (ref 26.0–34.0)
MCHC: 33.9 g/dL (ref 30.0–36.0)
MCV: 85.2 fL (ref 78.0–100.0)
MONO ABS: 1.7 10*3/uL — AB (ref 0.1–1.0)
MONOS PCT: 16 % — AB (ref 3–12)
NEUTROS ABS: 5.5 10*3/uL (ref 1.7–7.7)
Neutrophils Relative %: 53 % (ref 43–77)
Platelets: 131 10*3/uL — ABNORMAL LOW (ref 150–400)
RBC: 5.68 MIL/uL (ref 4.22–5.81)
RDW: 15.1 % (ref 11.5–15.5)
WBC: 10.3 10*3/uL (ref 4.0–10.5)

## 2013-04-14 LAB — URINALYSIS, ROUTINE W REFLEX MICROSCOPIC
BILIRUBIN URINE: NEGATIVE
Glucose, UA: NEGATIVE mg/dL
HGB URINE DIPSTICK: NEGATIVE
Ketones, ur: NEGATIVE mg/dL
Leukocytes, UA: NEGATIVE
Nitrite: NEGATIVE
PH: 7 (ref 5.0–8.0)
PROTEIN: NEGATIVE mg/dL
Specific Gravity, Urine: 1.011 (ref 1.005–1.030)
Urobilinogen, UA: 1 mg/dL (ref 0.0–1.0)

## 2013-04-14 LAB — SURGICAL PCR SCREEN
MRSA, PCR: NEGATIVE
Staphylococcus aureus: NEGATIVE

## 2013-04-14 LAB — TYPE AND SCREEN
ABO/RH(D): A POS
Antibody Screen: NEGATIVE

## 2013-04-14 LAB — PROTIME-INR
INR: 1.02 (ref 0.00–1.49)
PROTHROMBIN TIME: 13.2 s (ref 11.6–15.2)

## 2013-04-14 LAB — APTT: aPTT: 30 seconds (ref 24–37)

## 2013-04-14 MED ORDER — CHLORHEXIDINE GLUCONATE 4 % EX LIQD: 60.0000 mL | Freq: Once | CUTANEOUS | Status: DC

## 2013-04-14 MED ORDER — MELOXICAM 15 MG PO TABS
15.0000 mg | ORAL_TABLET | Freq: Every day | ORAL | Status: DC
Start: 2013-04-14 — End: 2013-04-19

## 2013-04-14 NOTE — Pre-Procedure Instructions (Signed)
Steven Patton  04/14/2013   Your procedure is scheduled on:  Friday March 20.  Report to [facility], Main Entrance [facility]" at 9:00 AM.  Call this number if you have problems the morning of surgery: [phone]   Remember:   Do not eat food or drink liquids after midnight Thursday.   Take these medicines the morning of surgery with A SIP OF WATER: amLODipine (NORVASC), carvedilol (COREG), gabapentin (NEURONTIN).              Take if needed :  nitroGLYCERIN (NITROSTAT).  Meloxicam(Mobic), Aspirin, Herbal medication and Phentermine as of today for surgery 04/21/13  Do not wear jewelry.  Do not wear lotions, powders, or perfumes.    Men may shave face and neck.  Do not bring valuables to the hospital.  [facility] is not responsible for any belongings or valuables.               Contacts, dentures or bridgework may not be worn into surgery.  Leave suitcase in the car. After surgery it may be brought to your room.  For patients admitted to the hospital, discharge time is determined by your treatment team.                Special Instructions: [place] - Preparing for Surgery  Before surgery, you can play an important role.  Because skin is not sterile, your skin needs to be as free of germs as possible.  You can reduce the number of germs on you skin by washing with CHG (chlorahexidine gluconate) soap before surgery.  CHG is an antiseptic cleaner which kills germs and bonds with the skin to continue killing germs even after washing.  Please DO NOT use if you have an allergy to CHG or antibacterial soaps.  If your skin becomes reddened/irritated stop using the CHG and inform your nurse when you arrive at Short Stay.  Do not shave (including legs and underarms) for at least 48 hours prior to the first CHG shower.  You may shave your face.  Please follow these instructions carefully:   1.  Shower with CHG Soap the night before surgery and the                                 morning of Surgery.  2.  If you choose to wash your hair, wash your hair first as usual with your       normal shampoo.  3.  After you shampoo, rinse your hair and body thoroughly to remove the                      Shampoo.  4.  Use CHG as you would any other liquid soap.  You can apply chg directly       to the skin and wash gently with scrungie or a clean washcloth.  5.  Apply the CHG Soap to your body ONLY FROM THE NECK DOWN.        Do not use on open wounds or open sores.  Avoid contact with your eyes,       ears, mouth and genitals (private parts).  Wash genitals (private parts)       with your normal soap.  6.  Wash thoroughly, paying special attention to the area where your surgery        will be performed.  7.  Thoroughly rinse your body with warm water from the neck down.  8.  DO NOT shower/wash with your normal soap after using and rinsing off       the CHG Soap.  9.  Pat yourself dry with a clean towel.            10.  Wear clean pajamas.            11.  Place clean sheets on your bed the night of your first shower and do not        sleep with pets.  Day of Surgery  Do not apply any lotions/deoderants the morning of surgery.  Please wear clean clothes to the hospital/surgery center.   -   Please read over the following fact sheets that you were given: Pain Booklet, Coughing and Deep Breathing, Blood Transfusion Information and Surgical Site Infection Prevention

## 2013-04-14 NOTE — Telephone Encounter (Signed)
Done

## 2013-04-14 NOTE — Telephone Encounter (Signed)
Patient request Rx for Meloxicam sent to [facility]. Fax [phone]. Jayanth Szczesniak,CMA

## 2013-04-17 ENCOUNTER — Other Ambulatory Visit: Payer: Self-pay

## 2013-04-19 ENCOUNTER — Other Ambulatory Visit: Payer: Self-pay

## 2013-04-19 DIAGNOSIS — I10 Essential (primary) hypertension: Secondary | ICD-10-CM

## 2013-04-19 MED ORDER — MELOXICAM 15 MG PO TABS: 15.0000 mg | ORAL_TABLET | Freq: Every day | ORAL | Status: DC

## 2013-04-19 MED ORDER — AMLODIPINE BESYLATE 10 MG PO TABS: 10.0000 mg | ORAL_TABLET | Freq: Every day | ORAL | Status: DC

## 2013-04-20 MED ORDER — DEXTROSE 5 % IV SOLN
3.0000 g | INTRAVENOUS | Status: AC
  Administered 2013-04-21: 3 g via INTRAVENOUS
  Filled 2013-04-20: qty 3000

## 2013-04-21 ENCOUNTER — Inpatient Hospital Stay (HOSPITAL_COMMUNITY)
Admission: RE | Admit: 2013-04-21 | Discharge: 2013-04-22 | DRG: 470 | Disposition: A | Discharge status: Home Health | Payer: Medicare PPO | Source: Ambulatory Visit | Attending: Orthopedic Surgery | Admitting: Orthopedic Surgery

## 2013-04-21 ENCOUNTER — Encounter (HOSPITAL_COMMUNITY): Payer: Medicare PPO | Admitting: Anesthesiology

## 2013-04-21 ENCOUNTER — Encounter (HOSPITAL_COMMUNITY): Payer: Self-pay | Admitting: *Deleted

## 2013-04-21 ENCOUNTER — Inpatient Hospital Stay (HOSPITAL_COMMUNITY): Payer: Medicare PPO

## 2013-04-21 ENCOUNTER — Encounter (HOSPITAL_COMMUNITY)
Admission: RE | Disposition: A | Discharge status: Home Health | Payer: Self-pay | Source: Ambulatory Visit | Attending: Orthopedic Surgery

## 2013-04-21 ENCOUNTER — Inpatient Hospital Stay (HOSPITAL_COMMUNITY): Payer: Medicare PPO | Admitting: Anesthesiology

## 2013-04-21 DIAGNOSIS — IMO0002 Reserved for concepts with insufficient information to code with codable children: Secondary | ICD-10-CM | POA: Diagnosis present

## 2013-04-21 DIAGNOSIS — E785 Hyperlipidemia, unspecified: Secondary | ICD-10-CM | POA: Diagnosis present

## 2013-04-21 DIAGNOSIS — Z96649 Presence of unspecified artificial hip joint: Secondary | ICD-10-CM

## 2013-04-21 DIAGNOSIS — Z87891 Personal history of nicotine dependence: Secondary | ICD-10-CM

## 2013-04-21 DIAGNOSIS — Z96659 Presence of unspecified artificial knee joint: Secondary | ICD-10-CM

## 2013-04-21 DIAGNOSIS — G4730 Sleep apnea, unspecified: Secondary | ICD-10-CM | POA: Diagnosis present

## 2013-04-21 DIAGNOSIS — M1611 Unilateral primary osteoarthritis, right hip: Secondary | ICD-10-CM | POA: Diagnosis present

## 2013-04-21 DIAGNOSIS — Z79899 Other long term (current) drug therapy: Secondary | ICD-10-CM

## 2013-04-21 DIAGNOSIS — M1710 Unilateral primary osteoarthritis, unspecified knee: Secondary | ICD-10-CM | POA: Diagnosis present

## 2013-04-21 DIAGNOSIS — M1610 Unilateral primary osteoarthritis, unspecified hip: Principal | ICD-10-CM | POA: Diagnosis present

## 2013-04-21 DIAGNOSIS — I10 Essential (primary) hypertension: Secondary | ICD-10-CM | POA: Diagnosis present

## 2013-04-21 DIAGNOSIS — Z7982 Long term (current) use of aspirin: Secondary | ICD-10-CM

## 2013-04-21 DIAGNOSIS — M169 Osteoarthritis of hip, unspecified: Principal | ICD-10-CM | POA: Diagnosis present

## 2013-04-21 DIAGNOSIS — M48062 Spinal stenosis, lumbar region with neurogenic claudication: Secondary | ICD-10-CM | POA: Diagnosis present

## 2013-04-21 DIAGNOSIS — L821 Other seborrheic keratosis: Secondary | ICD-10-CM | POA: Diagnosis present

## 2013-04-21 HISTORY — PX: TOTAL HIP ARTHROPLASTY: SHX124

## 2013-04-21 SURGERY — ARTHROPLASTY, HIP, TOTAL, ANTERIOR APPROACH
Anesthesia: General | Site: Hip | Laterality: Right

## 2013-04-21 MED ORDER — CEFAZOLIN SODIUM-DEXTROSE 2-3 GM-% IV SOLR
2.0000 g | Freq: Four times a day (QID) | INTRAVENOUS | Status: AC
  Administered 2013-04-21 – 2013-04-22 (×2): 2 g via INTRAVENOUS
  Filled 2013-04-21 (×2): qty 50

## 2013-04-21 MED ORDER — OXYCODONE-ACETAMINOPHEN 5-325 MG PO TABS
1.0000 | ORAL_TABLET | ORAL | Status: DC | PRN
  Administered 2013-04-22: 1 via ORAL
  Filled 2013-04-21: qty 1

## 2013-04-21 MED ORDER — ONDANSETRON HCL 4 MG/2ML IJ SOLN
INTRAMUSCULAR | Status: DC | PRN
  Administered 2013-04-21: 4 mg via INTRAVENOUS

## 2013-04-21 MED ORDER — GLYCOPYRROLATE 0.2 MG/ML IJ SOLN
INTRAMUSCULAR | Status: AC
  Filled 2013-04-21: qty 2

## 2013-04-21 MED ORDER — LISINOPRIL 20 MG PO TABS
20.0000 mg | ORAL_TABLET | Freq: Every day | ORAL | Status: DC
  Filled 2013-04-21: qty 1

## 2013-04-21 MED ORDER — LACTATED RINGERS IV SOLN
INTRAVENOUS | Status: DC
  Administered 2013-04-21: 09:00:00 via INTRAVENOUS

## 2013-04-21 MED ORDER — DOCUSATE SODIUM 100 MG PO CAPS
100.0000 mg | ORAL_CAPSULE | Freq: Two times a day (BID) | ORAL | Status: DC
  Administered 2013-04-21 – 2013-04-22 (×2): 100 mg via ORAL
  Filled 2013-04-21 (×3): qty 1

## 2013-04-21 MED ORDER — LIDOCAINE HCL (CARDIAC) 20 MG/ML IV SOLN
INTRAVENOUS | Status: DC | PRN
  Administered 2013-04-21: 80 mg via INTRAVENOUS

## 2013-04-21 MED ORDER — ONDANSETRON HCL 4 MG/2ML IJ SOLN: 4.0000 mg | Freq: Four times a day (QID) | INTRAMUSCULAR | Status: DC | PRN

## 2013-04-21 MED ORDER — BUPIVACAINE LIPOSOME 1.3 % IJ SUSP
INTRAMUSCULAR | Status: DC | PRN
  Administered 2013-04-21: 20 mL

## 2013-04-21 MED ORDER — TRANEXAMIC ACID 100 MG/ML IV SOLN
1000.0000 mg | INTRAVENOUS | Status: DC
  Filled 2013-04-21: qty 10

## 2013-04-21 MED ORDER — BUPIVACAINE LIPOSOME 1.3 % IJ SUSP
20.0000 mL | Freq: Once | INTRAMUSCULAR | Status: DC
  Filled 2013-04-21: qty 20

## 2013-04-21 MED ORDER — FENTANYL CITRATE 0.05 MG/ML IJ SOLN
INTRAMUSCULAR | Status: DC | PRN
  Administered 2013-04-21: 100 ug via INTRAVENOUS
  Administered 2013-04-21 (×3): 50 ug via INTRAVENOUS

## 2013-04-21 MED ORDER — LISINOPRIL-HYDROCHLOROTHIAZIDE 20-25 MG PO TABS: 1.0000 | ORAL_TABLET | Freq: Every day | ORAL | Status: DC

## 2013-04-21 MED ORDER — PHENYLEPHRINE HCL 10 MG/ML IJ SOLN
INTRAMUSCULAR | Status: DC | PRN
  Administered 2013-04-21 (×4): 80 ug via INTRAVENOUS

## 2013-04-21 MED ORDER — NEOSTIGMINE METHYLSULFATE 1 MG/ML IJ SOLN
INTRAMUSCULAR | Status: DC | PRN
  Administered 2013-04-21: 3 mg via INTRAVENOUS

## 2013-04-21 MED ORDER — LIDOCAINE HCL (CARDIAC) 20 MG/ML IV SOLN
INTRAVENOUS | Status: AC
  Filled 2013-04-21: qty 5

## 2013-04-21 MED ORDER — BISACODYL 5 MG PO TBEC: 5.0000 mg | DELAYED_RELEASE_TABLET | Freq: Every day | ORAL | Status: DC | PRN

## 2013-04-21 MED ORDER — ROCURONIUM BROMIDE 50 MG/5ML IV SOLN
INTRAVENOUS | Status: AC
  Filled 2013-04-21: qty 1

## 2013-04-21 MED ORDER — METHOCARBAMOL 500 MG PO TABS
500.0000 mg | ORAL_TABLET | Freq: Four times a day (QID) | ORAL | Status: DC | PRN
  Administered 2013-04-22 (×2): 500 mg via ORAL
  Filled 2013-04-21 (×2): qty 1

## 2013-04-21 MED ORDER — SODIUM CHLORIDE 0.9 % IV SOLN
1000.0000 mg | INTRAVENOUS | Status: DC | PRN
  Administered 2013-04-21: 1000 mg via INTRAVENOUS

## 2013-04-21 MED ORDER — NITROGLYCERIN 0.4 MG SL SUBL: 0.4000 mg | SUBLINGUAL_TABLET | SUBLINGUAL | Status: DC | PRN

## 2013-04-21 MED ORDER — GABAPENTIN 600 MG PO TABS
600.0000 mg | ORAL_TABLET | Freq: Three times a day (TID) | ORAL | Status: DC
  Administered 2013-04-21 – 2013-04-22 (×2): 600 mg via ORAL
  Filled 2013-04-21 (×4): qty 1

## 2013-04-21 MED ORDER — ASPIRIN EC 325 MG PO TBEC
325.0000 mg | DELAYED_RELEASE_TABLET | Freq: Two times a day (BID) | ORAL | Status: DC
  Administered 2013-04-21 – 2013-04-22 (×2): 325 mg via ORAL
  Filled 2013-04-21 (×4): qty 1

## 2013-04-21 MED ORDER — POLYETHYLENE GLYCOL 3350 17 G PO PACK: 17.0000 g | PACK | Freq: Every day | ORAL | Status: DC | PRN

## 2013-04-21 MED ORDER — ONDANSETRON HCL 4 MG/2ML IJ SOLN
INTRAMUSCULAR | Status: AC
  Filled 2013-04-21: qty 2

## 2013-04-21 MED ORDER — PROPOFOL 10 MG/ML IV BOLUS
INTRAVENOUS | Status: AC
  Filled 2013-04-21: qty 20

## 2013-04-21 MED ORDER — ACETAMINOPHEN 650 MG RE SUPP: 650.0000 mg | Freq: Four times a day (QID) | RECTAL | Status: DC | PRN

## 2013-04-21 MED ORDER — ROCURONIUM BROMIDE 100 MG/10ML IV SOLN
INTRAVENOUS | Status: DC | PRN
  Administered 2013-04-21: 10 mg via INTRAVENOUS
  Administered 2013-04-21: 30 mg via INTRAVENOUS
  Administered 2013-04-21: 50 mg via INTRAVENOUS
  Administered 2013-04-21: 10 mg via INTRAVENOUS

## 2013-04-21 MED ORDER — EPHEDRINE SULFATE 50 MG/ML IJ SOLN
INTRAMUSCULAR | Status: AC
  Filled 2013-04-21: qty 1

## 2013-04-21 MED ORDER — CARVEDILOL 12.5 MG PO TABS
12.5000 mg | ORAL_TABLET | Freq: Two times a day (BID) | ORAL | Status: DC
  Administered 2013-04-21 – 2013-04-22 (×2): 12.5 mg via ORAL
  Filled 2013-04-21 (×4): qty 1

## 2013-04-21 MED ORDER — ACETAMINOPHEN 325 MG PO TABS: 650.0000 mg | ORAL_TABLET | Freq: Four times a day (QID) | ORAL | Status: DC | PRN

## 2013-04-21 MED ORDER — OXYCODONE-ACETAMINOPHEN 5-325 MG PO TABS: 1.0000 | ORAL_TABLET | Freq: Four times a day (QID) | ORAL | Status: DC | PRN

## 2013-04-21 MED ORDER — NEOSTIGMINE METHYLSULFATE 1 MG/ML IJ SOLN
INTRAMUSCULAR | Status: AC
  Filled 2013-04-21: qty 10

## 2013-04-21 MED ORDER — 0.9 % SODIUM CHLORIDE (POUR BTL) OPTIME
TOPICAL | Status: DC | PRN
  Administered 2013-04-21: 1000 mL

## 2013-04-21 MED ORDER — EPHEDRINE SULFATE 50 MG/ML IJ SOLN
INTRAMUSCULAR | Status: DC | PRN
  Administered 2013-04-21: 10 mg via INTRAVENOUS
  Administered 2013-04-21: 5 mg via INTRAVENOUS
  Administered 2013-04-21 (×3): 10 mg via INTRAVENOUS
  Administered 2013-04-21: 5 mg via INTRAVENOUS
  Administered 2013-04-21: 10 mg via INTRAVENOUS

## 2013-04-21 MED ORDER — HYDROMORPHONE HCL PF 1 MG/ML IJ SOLN
0.5000 mg | INTRAMUSCULAR | Status: DC | PRN
  Administered 2013-04-22: 1 mg via INTRAVENOUS
  Filled 2013-04-21: qty 1

## 2013-04-21 MED ORDER — FENTANYL CITRATE 0.05 MG/ML IJ SOLN
INTRAMUSCULAR | Status: AC
  Filled 2013-04-21: qty 5

## 2013-04-21 MED ORDER — DEXTROSE 5 % IV SOLN
500.0000 mg | Freq: Four times a day (QID) | INTRAVENOUS | Status: DC | PRN
  Filled 2013-04-21: qty 5

## 2013-04-21 MED ORDER — ONDANSETRON HCL 4 MG PO TABS: 4.0000 mg | ORAL_TABLET | Freq: Four times a day (QID) | ORAL | Status: DC | PRN

## 2013-04-21 MED ORDER — LACTATED RINGERS IV SOLN
INTRAVENOUS | Status: DC | PRN
  Administered 2013-04-21 (×2): via INTRAVENOUS

## 2013-04-21 MED ORDER — HYDROCHLOROTHIAZIDE 25 MG PO TABS
25.0000 mg | ORAL_TABLET | Freq: Every day | ORAL | Status: DC
  Filled 2013-04-21: qty 1

## 2013-04-21 MED ORDER — METHOCARBAMOL 500 MG PO TABS: 750.0000 mg | ORAL_TABLET | Freq: Three times a day (TID) | ORAL | Status: DC | PRN

## 2013-04-21 MED ORDER — GLYCOPYRROLATE 0.2 MG/ML IJ SOLN
INTRAMUSCULAR | Status: DC | PRN
  Administered 2013-04-21: 0.4 mg via INTRAVENOUS

## 2013-04-21 MED ORDER — MIDAZOLAM HCL 5 MG/5ML IJ SOLN
INTRAMUSCULAR | Status: DC | PRN
Start: 2013-04-21 — End: 2013-04-21
  Administered 2013-04-21: 2 mg via INTRAVENOUS

## 2013-04-21 MED ORDER — ALUM & MAG HYDROXIDE-SIMETH 200-200-20 MG/5ML PO SUSP: 30.0000 mL | ORAL | Status: DC | PRN

## 2013-04-21 MED ORDER — MIDAZOLAM HCL 2 MG/2ML IJ SOLN
INTRAMUSCULAR | Status: AC
  Filled 2013-04-21: qty 2

## 2013-04-21 MED ORDER — SODIUM CHLORIDE 0.9 % IV SOLN: INTRAVENOUS | Status: DC

## 2013-04-21 MED ORDER — PHENOL 1.4 % MT LIQD: 1.0000 | OROMUCOSAL | Status: DC | PRN

## 2013-04-21 MED ORDER — PROMETHAZINE HCL 25 MG/ML IJ SOLN: 12.5000 mg | Freq: Four times a day (QID) | INTRAMUSCULAR | Status: DC | PRN

## 2013-04-21 MED ORDER — AMLODIPINE BESYLATE 10 MG PO TABS
10.0000 mg | ORAL_TABLET | Freq: Every day | ORAL | Status: DC
Start: 2013-04-21 — End: 2013-04-22
  Filled 2013-04-21 (×2): qty 1

## 2013-04-21 MED ORDER — PROPOFOL 10 MG/ML IV BOLUS
INTRAVENOUS | Status: DC | PRN
  Administered 2013-04-21: 160 mg via INTRAVENOUS

## 2013-04-21 MED ORDER — MENTHOL 3 MG MT LOZG: 1.0000 | LOZENGE | OROMUCOSAL | Status: DC | PRN

## 2013-04-21 MED ORDER — MELOXICAM 15 MG PO TABS
15.0000 mg | ORAL_TABLET | Freq: Every day | ORAL | Status: DC
  Administered 2013-04-21 – 2013-04-22 (×2): 15 mg via ORAL
  Filled 2013-04-21 (×2): qty 1

## 2013-04-21 MED ORDER — PHENYLEPHRINE 40 MCG/ML (10ML) SYRINGE FOR IV PUSH (FOR BLOOD PRESSURE SUPPORT)
PREFILLED_SYRINGE | INTRAVENOUS | Status: AC
  Filled 2013-04-21: qty 10

## 2013-04-21 SURGICAL SUPPLY — 64 items
BANDAGE GAUZE ELAST BULKY 4 IN (GAUZE/BANDAGES/DRESSINGS) IMPLANT
BENZOIN TINCTURE PRP APPL 2/3 (GAUZE/BANDAGES/DRESSINGS) ×3 IMPLANT
BLADE SAW SGTL 18X1.27X75 (BLADE) ×2 IMPLANT
BLADE SAW SGTL 18X1.27X75MM (BLADE) ×1
BLADE SURG ROTATE 9660 (MISCELLANEOUS) IMPLANT
BNDG COHESIVE 6X5 TAN STRL LF (GAUZE/BANDAGES/DRESSINGS) IMPLANT
CAPT HIP PF COP ×3 IMPLANT
CELLS DAT CNTRL 66122 CELL SVR (MISCELLANEOUS) ×1 IMPLANT
CLOSURE WOUND 1/2 X4 (GAUZE/BANDAGES/DRESSINGS) ×1
COVER BACK TABLE 24X17X13 BIG (DRAPES) IMPLANT
COVER SURGICAL LIGHT HANDLE (MISCELLANEOUS) ×6 IMPLANT
DRAPE C-ARM 42X72 X-RAY (DRAPES) ×3 IMPLANT
DRAPE STERI IOBAN 125X83 (DRAPES) ×3 IMPLANT
DRAPE U-SHAPE 47X51 STRL (DRAPES) ×6 IMPLANT
DRSG AQUACEL AG ADV 3.5X10 (GAUZE/BANDAGES/DRESSINGS) ×3 IMPLANT
DRSG MEPILEX BORDER 4X8 (GAUZE/BANDAGES/DRESSINGS) IMPLANT
DURAPREP 26ML APPLICATOR (WOUND CARE) ×3 IMPLANT
ELECT BLADE TIP CTD 4 INCH (ELECTRODE) ×3 IMPLANT
ELECT CAUTERY BLADE 6.4 (BLADE) ×3 IMPLANT
ELECT REM PT RETURN 9FT ADLT (ELECTROSURGICAL) ×3
ELECTRODE REM PT RTRN 9FT ADLT (ELECTROSURGICAL) ×1 IMPLANT
ELIMINATOR HOLE APEX DEPUY (Hips) IMPLANT
FACESHIELD STD STERILE (MASK) ×3 IMPLANT
GAUZE XEROFORM 1X8 LF (GAUZE/BANDAGES/DRESSINGS) IMPLANT
GLOVE BIO SURGEON STRL SZ 6.5 (GLOVE) ×4 IMPLANT
GLOVE BIO SURGEONS STRL SZ 6.5 (GLOVE) ×2
GLOVE BIOGEL PI IND STRL 6.5 (GLOVE) ×1 IMPLANT
GLOVE BIOGEL PI IND STRL 7.0 (GLOVE) ×1 IMPLANT
GLOVE BIOGEL PI IND STRL 8 (GLOVE) ×2 IMPLANT
GLOVE BIOGEL PI INDICATOR 6.5 (GLOVE) ×2
GLOVE BIOGEL PI INDICATOR 7.0 (GLOVE) ×2
GLOVE BIOGEL PI INDICATOR 8 (GLOVE) ×4
GLOVE BIOGEL PI ORTHO PRO 7.5 (GLOVE) ×2
GLOVE ECLIPSE 7.5 STRL STRAW (GLOVE) ×6 IMPLANT
GLOVE PI ORTHO PRO STRL 7.5 (GLOVE) ×1 IMPLANT
GLOVE SURG SS PI 7.5 STRL IVOR (GLOVE) ×3 IMPLANT
GOWN STRL REUS W/ TWL LRG LVL3 (GOWN DISPOSABLE) ×3 IMPLANT
GOWN STRL REUS W/ TWL XL LVL3 (GOWN DISPOSABLE) ×4 IMPLANT
GOWN STRL REUS W/TWL LRG LVL3 (GOWN DISPOSABLE) ×6
GOWN STRL REUS W/TWL XL LVL3 (GOWN DISPOSABLE) ×8
HOOD PEEL AWAY FACE SHEILD DIS (HOOD) ×6 IMPLANT
KIT BASIN OR (CUSTOM PROCEDURE TRAY) ×3 IMPLANT
KIT ROOM TURNOVER OR (KITS) ×3 IMPLANT
MANIFOLD NEPTUNE II (INSTRUMENTS) ×3 IMPLANT
NEEDLE 22X1 1/2 (OR ONLY) (NEEDLE) ×3 IMPLANT
NS IRRIG 1000ML POUR BTL (IV SOLUTION) ×3 IMPLANT
PACK TOTAL JOINT (CUSTOM PROCEDURE TRAY) ×3 IMPLANT
PAD ARMBOARD 7.5X6 YLW CONV (MISCELLANEOUS) ×3 IMPLANT
RTRCTR WOUND ALEXIS 18CM MED (MISCELLANEOUS) ×3
SPONGE LAP 18X18 X RAY DECT (DISPOSABLE) IMPLANT
STAPLER VISISTAT 35W (STAPLE) IMPLANT
STRIP CLOSURE SKIN 1/2X4 (GAUZE/BANDAGES/DRESSINGS) ×2 IMPLANT
SUT MNCRL AB 3-0 PS2 18 (SUTURE) ×3 IMPLANT
SUT VIC AB 0 CT1 27 (SUTURE) ×2
SUT VIC AB 0 CT1 27XBRD ANBCTR (SUTURE) ×1 IMPLANT
SUT VIC AB 1 CT1 27 (SUTURE) ×8
SUT VIC AB 1 CT1 27XBRD ANBCTR (SUTURE) ×4 IMPLANT
SUT VIC AB 2-0 CT1 27 (SUTURE) ×2
SUT VIC AB 2-0 CT1 TAPERPNT 27 (SUTURE) ×1 IMPLANT
SYR 30ML LL (SYRINGE) ×3 IMPLANT
TOWEL OR 17X24 6PK STRL BLUE (TOWEL DISPOSABLE) ×3 IMPLANT
TOWEL OR 17X26 10 PK STRL BLUE (TOWEL DISPOSABLE) ×3 IMPLANT
TRAY FOLEY CATH 16FRSI W/METER (SET/KITS/TRAYS/PACK) IMPLANT
WATER STERILE IRR 1000ML POUR (IV SOLUTION) IMPLANT

## 2013-04-21 NOTE — Preoperative (Signed)
Beta Blockers   Reason not to administer Beta Blockers:Not Applicable 

## 2013-04-21 NOTE — Progress Notes (Signed)
Orthopedic Tech Progress Note Patient Details:  Steven Patton 03/17/1935 [name] applied to bed Patient ID: Steven CamelHoyle Patton, male   DOB: 05/23/1935, 78 y.o.   MRN: [id number]   Steven Patton 04/21/2013, 5:17 PM

## 2013-04-21 NOTE — H&P (Signed)
TOTAL HIP ADMISSION H&P  Patient is admitted for right total hip arthroplasty.  Subjective:  Chief Complaint: right hip pain  HPI: Steven Patton, 78 y.o. male, has a history of pain and functional disability in the right hip(s) due to arthritis and patient has failed non-surgical conservative treatments for greater than 12 weeks to include NSAID's and/or analgesics, supervised PT with diminished ADL's post treatment, use of assistive devices, weight reduction as appropriate and activity modification.  Onset of symptoms was gradual starting 8 years ago with gradually worsening course since that time.The patient noted no past surgery on the right hip(s).  Patient currently rates pain in the right hip at 8 out of 10 with activity. Patient has night pain, worsening of pain with activity and weight bearing, trendelenberg gait, pain that interfers with activities of daily living, pain with passive range of motion, crepitus and joint swelling. Patient has evidence of subchondral cysts, subchondral sclerosis, periarticular osteophytes and joint space narrowing by imaging studies. This condition presents safety issues increasing the risk of falls. This patient has had failure of conservative care.  There is no current active infection.  Patient Active Problem List   Diagnosis Date Noted  . Preop cardiovascular exam 07/27/2012  . Elevated white blood cell count 07/27/2012  . Obesity 06/20/2012  . Chest pain 06/08/2012  . H/O left total hip arthroplasty 05/18/2012  . Lumbar stenosis with neurogenic claudication 01/29/2012  . Knee osteoarthritis 01/18/2012  . Hypertension 01/04/2012  . Lower extremity edema 01/04/2012  . Seborrheic keratosis 01/04/2012  . Hyperlipidemia 01/04/2012  . Preventive measure 01/04/2012  . Sleep apnea 01/04/2012   Past Medical History  Diagnosis Date  . Cataract   . Macular degeneration, left eye   . Arthritis   . Sleep apnea     2008.Marland KitchenMarland KitchenWEARS CPAP  . Hypertension    takes Amlodipine,Lisinopril,and Metoprolol daily    Past Surgical History  Procedure Laterality Date  . Knee surgery    . Cardiac catheterization  06-10-12  . Eye surgery      CATARACT RIGHT EYE  . Tonsillectomy    . Total knee arthroplasty Left 09/16/2012    Procedure: TOTAL KNEE ARTHROPLASTY;  Surgeon: Alta Corning, MD;  Location: [facility];  Service: Orthopedics;  Laterality: Left;  . Lumbar laminectomy/decompression microdiscectomy N/A 11/02/2012    Procedure: LUMBAR LAMINECTOMY/DECOMPRESSION MICRODISCECTOMY 2 LEVEL;  Surgeon: Sinclair Ship, MD;  Location: [place] [place];  Service: Orthopedics;  Laterality: N/A;  Lumbar 4-5,lumbar 5-sacrum 1 decompression  . Total hip arthroplasty Left 01/06/2013    Procedure: LEFT TOTAL HIP ARTHROPLASTY-Posterior approach;  Surgeon: Alta Corning, MD;  Location: [place];  Service: Orthopedics;  Laterality: Left;  . Colonoscopy      Prescriptions prior to admission  Medication Sig Dispense Refill  . amLODipine (NORVASC) 10 MG tablet Take 1 tablet (10 mg total) by mouth daily.  90 tablet  3  . gabapentin (NEURONTIN) 600 MG tablet Take 1 tablet (600 mg total) by mouth 3 (three) times daily.  180 tablet  3  . lisinopril-hydrochlorothiazide (PRINZIDE,ZESTORETIC) 20-25 MG per tablet Take 1 tablet by mouth daily.  90 tablet  3  . nitroGLYCERIN (NITROSTAT) 0.4 MG SL tablet Place 0.4 mg under the tongue every 5 (five) minutes as needed for chest pain.      . phentermine 37.5 MG capsule Take 1 capsule (37.5 mg total) by mouth every morning.  30 capsule  0  . aspirin EC 325 MG EC tablet Take 1 tablet (325  mg total) by mouth 2 (two) times daily after a meal.  30 tablet  0  . carvedilol (COREG) 25 MG tablet Take 0.5 tablets (12.5 mg total) by mouth 2 (two) times daily with a meal.  60 tablet  3  . meloxicam (MOBIC) 15 MG tablet Take 1 tablet (15 mg total) by mouth daily.  90 tablet  3   No Known Allergies  History  Substance Use Topics  . Smoking status: Former Smoker --  1.00 packs/day for 20 years    Types: Cigarettes    Quit date: 01/04/1993  . Smokeless tobacco: Former Systems developer    Types: [place] date: 01/04/1993     Comment: quit 85yr ago  . Alcohol Use: No    Family History  Problem Relation Age of Onset  . Cancer Daughter      ROS ROS: I have reviewed the patient's review of systems thoroughly and there are no positive responses as relates to the HPI.  Objective:  Physical Exam  Vital signs in last 24 hours: Temp:  [97.6 F (36.4 C)] 97.6 F (36.4 C) (03/20 0839) Pulse Rate:  [59] 59 (03/20 0839) Resp:  [18] 18 (03/20 0839) BP: (136)/(70) 136/70 mmHg (03/20 0839) SpO2:  [96 %] 96 % (03/20 0839) Weight:  [280 lb (127.007 kg)] 280 lb (127.007 kg) (03/20 0839) Well-developed well-nourished patient in no acute distress. Alert and oriented x3 HEENT:within normal limits Cardiac: Regular rate and rhythm Pulmonary: Lungs clear to auscultation Abdomen: Soft and nontender.  Normal active bowel sounds  Musculoskeletal: (R HIP: painful rom pain with int rotation Labs: Recent Results (from the past 2160 hour(s))  CBC     Status: Abnormal   Collection Time    03/24/13 11:39 AM      Result Value Ref Range   WBC 9.6  4.0 - 10.5 K/uL   RBC 5.32  4.22 - 5.81 MIL/uL   Hemoglobin 14.9  13.0 - 17.0 g/dL   HCT 44.5  39.0 - 52.0 %   MCV 83.6  78.0 - 100.0 fL   MCH 28.0  26.0 - 34.0 pg   MCHC 33.5  30.0 - 36.0 g/dL   RDW 15.8 (*) 11.5 - 15.5 %   Platelets 147 (*) 150 - 400 K/uL  COMPREHENSIVE METABOLIC PANEL     Status: None   Collection Time    03/24/13 11:39 AM      Result Value Ref Range   Sodium 141  135 - 145 mEq/L   Potassium 3.8  3.5 - 5.3 mEq/L   Chloride 104  96 - 112 mEq/L   CO2 27  19 - 32 mEq/L   Glucose, Bld 91  70 - 99 mg/dL   BUN 10  6 - 23 mg/dL   Creat 0.93  0.50 - 1.35 mg/dL   Total Bilirubin 0.6  0.2 - 1.2 mg/dL   Comment: ** Please note change in reference range(s). **   Alkaline Phosphatase 43  39 - 117 U/L   AST  17  0 - 37 U/L   ALT 12  0 - 53 U/L   Total Protein 6.8  6.0 - 8.3 g/dL   Albumin 4.7  3.5 - 5.2 g/dL   Calcium 9.2  8.4 - 10.5 mg/dL  LIPID PANEL     Status: None   Collection Time    03/24/13 11:39 AM      Result Value Ref Range   Cholesterol 155  0 - 200  mg/dL   Comment: ATP III Classification:           < 200        mg/dL        Desirable          200 - 239     mg/dL        Borderline High          >= 240        mg/dL        High         Triglycerides 136  <150 mg/dL   HDL 41  >39 mg/dL   Total CHOL/HDL Ratio 3.8     VLDL 27  0 - 40 mg/dL   LDL Cholesterol 87  0 - 99 mg/dL   Comment:       Total Cholesterol/HDL Ratio:CHD Risk                            Coronary Heart Disease Risk Table                                            Men       Women              1/2 Average Risk              3.4        3.3                  Average Risk              5.0        4.4               2X Average Risk              9.6        7.1               3X Average Risk             23.4       11.0     Use the calculated Patient Ratio above and the CHD Risk table      to determine the patient's CHD Risk.     ATP III Classification (LDL):           < 100        mg/dL         Optimal          100 - 129     mg/dL         Near or Above Optimal          130 - 159     mg/dL         Borderline High          160 - 189     mg/dL         High           > 190        mg/dL         Very High        HEMOGLOBIN A1C     Status: Abnormal   Collection Time    03/24/13 11:39 AM      Result Value Ref Range   Hemoglobin A1C 5.7 (*) <  5.7 %   Comment:                                                                            According to the ADA Clinical Practice Recommendations for 2011, when     HbA1c is used as a screening test:             >=6.5%   Diagnostic of Diabetes Mellitus                (if abnormal result is confirmed)           5.7-6.4%   Increased risk of developing Diabetes Mellitus            References:Diagnosis and Classification of Diabetes Mellitus,Diabetes     QIWL,7989,21(JHERD 1):S62-S69 and Standards of Medical Care in             Diabetes - 2011,Diabetes EYCX,4481,85 (Suppl 1):S11-S61.         Mean Plasma Glucose 117 (*) <117 mg/dL  TSH     Status: None   Collection Time    03/24/13 11:39 AM      Result Value Ref Range   TSH 1.668  0.350 - 4.500 uIU/mL  SURGICAL PCR SCREEN     Status: None   Collection Time    04/14/13  2:07 PM      Result Value Ref Range   MRSA, PCR NEGATIVE  NEGATIVE   Staphylococcus aureus NEGATIVE  NEGATIVE   Comment:            The Xpert SA Assay (FDA     approved for NASAL specimens     in patients over 60 years of age),     is one component of     a comprehensive surveillance     program.  Test performance has     been validated by [employer] for patients greater     than or equal to 18 year old.     It is not intended     to diagnose infection nor to     guide or monitor treatment.  URINALYSIS, ROUTINE W REFLEX MICROSCOPIC     Status: None   Collection Time    04/14/13  2:07 PM      Result Value Ref Range   Color, Urine YELLOW  YELLOW   APPearance CLEAR  CLEAR   Specific Gravity, Urine 1.011  1.005 - 1.030   pH 7.0  5.0 - 8.0   Glucose, UA NEGATIVE  NEGATIVE mg/dL   Hgb urine dipstick NEGATIVE  NEGATIVE   Bilirubin Urine NEGATIVE  NEGATIVE   Ketones, ur NEGATIVE  NEGATIVE mg/dL   Protein, ur NEGATIVE  NEGATIVE mg/dL   Urobilinogen, UA 1.0  0.0 - 1.0 mg/dL   Nitrite NEGATIVE  NEGATIVE   Leukocytes, UA NEGATIVE  NEGATIVE   Comment: MICROSCOPIC NOT DONE ON URINES WITH NEGATIVE PROTEIN, BLOOD, LEUKOCYTES, NITRITE, OR GLUCOSE <1000 mg/dL.  APTT     Status: None   Collection Time    04/14/13  2:09 PM      Result Value Ref Range   aPTT 30  24 - 37 seconds  CBC WITH DIFFERENTIAL  Status: Abnormal   Collection Time    04/14/13  2:09 PM      Result Value Ref Range   WBC 10.3  4.0 - 10.5 K/uL   RBC 5.68  4.22 - 5.81  MIL/uL   Hemoglobin 16.4  13.0 - 17.0 g/dL   HCT 48.4  39.0 - 52.0 %   MCV 85.2  78.0 - 100.0 fL   MCH 28.9  26.0 - 34.0 pg   MCHC 33.9  30.0 - 36.0 g/dL   RDW 15.1  11.5 - 15.5 %   Platelets 131 (*) 150 - 400 K/uL   Neutrophils Relative % 53  43 - 77 %   Neutro Abs 5.5  1.7 - 7.7 K/uL   Lymphocytes Relative 28  12 - 46 %   Lymphs Abs 2.9  0.7 - 4.0 K/uL   Monocytes Relative 16 (*) 3 - 12 %   Monocytes Absolute 1.7 (*) 0.1 - 1.0 K/uL   Eosinophils Relative 2  0 - 5 %   Eosinophils Absolute 0.2  0.0 - 0.7 K/uL   Basophils Relative 1  0 - 1 %   Basophils Absolute 0.1  0.0 - 0.1 K/uL  COMPREHENSIVE METABOLIC PANEL     Status: Abnormal   Collection Time    04/14/13  2:09 PM      Result Value Ref Range   Sodium 141  137 - 147 mEq/L   Potassium 3.9  3.7 - 5.3 mEq/L   Chloride 100  96 - 112 mEq/L   CO2 27  19 - 32 mEq/L   Glucose, Bld 101 (*) 70 - 99 mg/dL   BUN 12  6 - 23 mg/dL   Creatinine, Ser 1.08  0.50 - 1.35 mg/dL   Calcium 9.7  8.4 - 10.5 mg/dL   Total Protein 7.2  6.0 - 8.3 g/dL   Albumin 4.0  3.5 - 5.2 g/dL   AST 17  0 - 37 U/L   ALT 14  0 - 53 U/L   Alkaline Phosphatase 52  39 - 117 U/L   Total Bilirubin 0.5  0.3 - 1.2 mg/dL   GFR calc non Af Amer 65 (*) >90 mL/min   GFR calc Af Amer 75 (*) >90 mL/min   Comment: (NOTE)     The eGFR has been calculated using the CKD EPI equation.     This calculation has not been validated in all clinical situations.     eGFR's persistently <90 mL/min signify possible Chronic Kidney     Disease.  PROTIME-INR     Status: None   Collection Time    04/14/13  2:09 PM      Result Value Ref Range   Prothrombin Time 13.2  11.6 - 15.2 seconds   INR 1.02  0.00 - 1.49  TYPE AND SCREEN     Status: None   Collection Time    04/14/13  2:10 PM      Result Value Ref Range   ABO/RH(D) A POS     Antibody Screen NEG     Sample Expiration 04/28/2013      Estimated body mass index is 41.33 kg/(m^2) as calculated from the following:   Height  as of 04/14/13: '5\' 9"'  (1.753 m).   Weight as of this encounter: 280 lb (127.007 kg).   Imaging Review Plain radiographs demonstrate severe degenerative joint disease of the right hip(s). The bone quality appears to be good for age and reported activity level.  Assessment/Plan:  End stage arthritis, right hip(s)  The patient history, physical examination, clinical judgement of the provider and imaging studies are consistent with end stage degenerative joint disease of the right hip(s) and total hip arthroplasty is deemed medically necessary. The treatment options including medical management, injection therapy, arthroscopy and arthroplasty were discussed at length. The risks and benefits of total hip arthroplasty were presented and reviewed. The risks due to aseptic loosening, infection, stiffness, dislocation/subluxation,  thromboembolic complications and other imponderables were discussed.  The patient acknowledged the explanation, agreed to proceed with the plan and consent was signed. Patient is being admitted for inpatient treatment for surgery, pain control, PT, OT, prophylactic antibiotics, VTE prophylaxis, progressive ambulation and ADL's and discharge planning.The patient is planning to be discharged home with home health services

## 2013-04-21 NOTE — Evaluation (Signed)
Physical Therapy Evaluation Patient Details Name: Pratik Dalziel MRN: [id number] DOB: 11-Jun-1935 Today's Date: 04/21/2013 Time: 4098-1191 PT Time Calculation (min): 15 min  PT Assessment / Plan / Recommendation History of Present Illness  Patient is a 78 yo male s/p Rt THA - anterior approach.   Patient with h/o Lt THA and Lt TKA both in 2014.  Clinical Impression  Patient presents with problems listed below.  Will benefit from acute PT to maximize independence prior to discharge home with family.    PT Assessment  Patient needs continued PT services    Follow Up Recommendations  Home health PT;Supervision/Assistance - 24 hour    Does the patient have the potential to tolerate intense rehabilitation      Barriers to Discharge        Equipment Recommendations  None recommended by PT    Recommendations for Other Services     Frequency 7X/week    Precautions / Restrictions Precautions Precautions: None Restrictions Weight Bearing Restrictions: Yes RLE Weight Bearing: Weight bearing as tolerated   Pertinent Vitals/Pain       Mobility  Bed Mobility Overal bed mobility: Needs Assistance Bed Mobility: Sidelying to Sit Sidelying to sit: Min assist General bed mobility comments: Patient attempted to sit straight up in bed.  Instructed patient to roll to side and then come to sitting - min assist to raise trunk to sitting.  Once upright, patient with good sitting balance. Transfers Overall transfer level: Needs assistance Equipment used: Rolling walker (2 wheeled) Transfers: Sit to/from Stand Sit to Stand: Min assist General transfer comment: Verbal cues for hand placement and technique.  Assist to rise to standing and for balance initially. Ambulation/Gait Ambulation/Gait assistance: Min assist Ambulation Distance (Feet): 28 Feet Assistive device: Rolling walker (2 wheeled) Gait Pattern/deviations: Step-to pattern;Decreased stance time - right;Decreased step length -  left;Trunk flexed Gait velocity: Slow gait speed Gait velocity interpretation: Below normal speed for age/gender General Gait Details: Verbal cues for safe use of RW and gait sequence.  Patient did well with gait.    Exercises Total Joint Exercises Ankle Circles/Pumps: AROM;Both;10 reps;Seated   PT Diagnosis: Difficulty walking;Generalized weakness  PT Problem List: Decreased strength;Decreased activity tolerance;Decreased mobility;Decreased knowledge of use of DME PT Treatment Interventions: DME instruction;Gait training;Functional mobility training;Therapeutic exercise;Patient/family education     PT Goals(Current goals can be found in the care plan section) Acute Rehab PT Goals Patient Stated Goal: To go home soon PT Goal Formulation: With patient/family Time For Goal Achievement: 04/28/13 Potential to Achieve Goals: Good  Visit Information  Last PT Received On: 04/21/13 Assistance Needed: +1 History of Present Illness: Patient is a 78 yo male s/p Rt THA - anterior approach.   Patient with h/o Lt THA and Lt TKA both in 2014.       Prior Functioning  Home Living Family/patient expects to be discharged to:: Private residence Living Arrangements: Spouse/significant other Available Help at Discharge: Family;Available 24 hours/day Type of Home: House Home Access: Level entry Home Layout: Two level;Able to live on main level with bedroom/bathroom Home Equipment: Dan Humphreys - 2 wheels;Bedside commode;Shower seat Prior Function Level of Independence: Independent Communication Communication: No difficulties    Cognition  Cognition Arousal/Alertness: Awake/alert Behavior During Therapy: WFL for tasks assessed/performed Overall Cognitive Status: Within Functional Limits for tasks assessed    Extremity/Trunk Assessment Upper Extremity Assessment Upper Extremity Assessment: Overall WFL for tasks assessed Lower Extremity Assessment Lower Extremity Assessment: RLE  deficits/detail RLE Deficits / Details: Strength 4-/5   Balance  End of Session PT - End of Session Equipment Utilized During Treatment: Gait belt Activity Tolerance: Patient tolerated treatment well;Patient limited by fatigue Patient left: in chair;with call bell/phone within reach;with family/visitor present Nurse Communication: Mobility status  GP     Vena AustriaDavis, Juline Sanderford H 04/21/2013, 6:36 PM Durenda HurtSusan H. Renaldo Fiddleravis, PT, [facility] Acute Rehab Services Pager [phone]

## 2013-04-21 NOTE — Progress Notes (Signed)
Utilization review completed.  

## 2013-04-21 NOTE — Transfer of Care (Signed)
Immediate Anesthesia Transfer of Care Note  Patient: Steven Patton  Procedure(s) Performed: Procedure(s): TOTAL HIP ARTHROPLASTY ANTERIOR APPROACH (Right)  Patient Location: PACU  Anesthesia Type:General  Level of Consciousness: awake  Airway & Oxygen Therapy: Patient Spontanous Breathing and Patient connected to nasal cannula oxygen  Post-op Assessment: Report given to PACU RN and Post -op Vital signs reviewed and stable  Post vital signs: Reviewed and stable  Complications: No apparent anesthesia complications

## 2013-04-21 NOTE — Anesthesia Preprocedure Evaluation (Addendum)
Anesthesia Evaluation  Patient identified by MRN, date of birth, ID band Patient awake    Reviewed: Allergy & Precautions, H&P , NPO status , Patient's Chart, lab work & pertinent test results  History of Anesthesia Complications Negative for: history of anesthetic complications  Airway Mallampati: II      Dental  (+) Edentulous Upper, Edentulous Lower   Pulmonary sleep apnea and Continuous Positive Airway Pressure Ventilation , neg COPDformer smoker,  breath sounds clear to auscultation        Cardiovascular hypertension, Pt. on medications - angina- Past MI and - CHF - dysrhythmias - Valvular Problems/MurmursRhythm:Regular Rate:Normal     Neuro/Psych negative neurological ROS  negative psych ROS   GI/Hepatic negative GI ROS, Neg liver ROS,   Endo/Other  negative endocrine ROS  Renal/GU negative Renal ROS     Musculoskeletal   Abdominal   Peds  Hematology negative hematology ROS (+)   Anesthesia Other Findings   Reproductive/Obstetrics                          Anesthesia Physical Anesthesia Plan  ASA: III  Anesthesia Plan: General   Post-op Pain Management:    Induction: Intravenous  Airway Management Planned: Oral ETT  Additional Equipment: None  Intra-op Plan:   Post-operative Plan: Extubation in OR  Informed Consent: I have reviewed the patients History and Physical, chart, labs and discussed the procedure including the risks, benefits and alternatives for the proposed anesthesia with the patient or authorized representative who has indicated his/her understanding and acceptance.     Plan Discussed with: CRNA and Surgeon  Anesthesia Plan Comments:        Anesthesia Quick Evaluation

## 2013-04-21 NOTE — Brief Op Note (Signed)
04/21/2013  6:22 PM  PATIENT:  Audree CamelHoyle Angelino  78 y.o. male  PRE-OPERATIVE DIAGNOSIS:  DEGENERATIVE JOINT DISEASE, Right hip  POST-OPERATIVE DIAGNOSIS:  DEGENERATIVE JOINT DISEASE, Right hip  PROCEDURE:  Procedure(s): TOTAL HIP ARTHROPLASTY ANTERIOR APPROACH (Right)  SURGEON:  Surgeon(s) and Role:    * Harvie JuniorJohn L Jonerik Sliker, MD - Primary  PHYSICIAN ASSISTANT:   ASSISTANTS: bethune   ANESTHESIA:   general  EBL:  Total I/O In: 1650 [I.V.:1650] Out: 350 [Blood:350]  BLOOD ADMINISTERED:none  DRAINS: none   LOCAL MEDICATIONS USED:  OTHER experel  SPECIMEN:  No Specimen  DISPOSITION OF SPECIMEN:  N/A  COUNTS:  YES  TOURNIQUET:  * No tourniquets in log *  DICTATION: .Other Dictation: Dictation Number [id number]  PLAN OF CARE: Admit to inpatient   PATIENT DISPOSITION:  PACU - hemodynamically stable.   Delay start of Pharmacological VTE agent (>24hrs) due to surgical blood loss or risk of bleeding: no

## 2013-04-21 NOTE — Discharge Instructions (Signed)
Total Hip Replacement °Care After °Refer to this sheet in the next few weeks. These instructions provide you with information on caring for yourself after your procedure. Your caregiver also may give you specific instructions. Your treatment has been planned according to the most current medical practices, but problems sometimes occur. Call your caregiver if you have any problems or questions after your procedure. °HOME CARE INSTRUCTIONS  °Your caregiver will give you specific precautions for certain types of movement. Additional instructions include: °· Take over-the-counter or prescription medicines for pain, discomfort, or fever only as directed by your caregiver. °· Take quick showers (3 5 min) rather than bathe until your caregiver tells you that you can take baths again. °· Avoid lifting until your caregiver instructs you otherwise. °· Use a raised toilet seat and avoid sitting in low chairs as instructed by your caregiver. °· Use crutches or a walker as instructed by your caregiver. °SEEK MEDICAL CARE IF: °· You have difficulty breathing. °· Your wound is red, swollen, or has become increasingly painful. °· You have pus draining from your wound. °· You have a bad smell coming from your wound. °· You have persistent bleeding from your wound. °· Your wound breaks open after sutures (stitches) or staples have been removed. °SEEK IMMEDIATE MEDICAL CARE IF:  °· You have a fever. °· You have a rash. °· You have pain or swelling in your calf or thigh. °· You have shortness of breath or chest pain. °MAKE SURE YOU: °· Understand these instructions. °· Will watch your condition. °· Will get help right away if you are not doing well or get worse. °Document Released: 08/08/2004 Document Revised: 07/21/2011 Document Reviewed: 03/08/2011 °[employer]® Patient Information ©2014 [employer]. ° °

## 2013-04-21 NOTE — Anesthesia Postprocedure Evaluation (Signed)
  Anesthesia Post-op Note  Patient: Steven Patton  Procedure(s) Performed: Procedure(s): TOTAL HIP ARTHROPLASTY ANTERIOR APPROACH (Right)  Patient Location: PACU  Anesthesia Type:General  Level of Consciousness: awake, alert  and oriented  Airway and Oxygen Therapy: Patient Spontanous Breathing  Post-op Pain: mild  Post-op Assessment: Post-op Vital signs reviewed, Patient's Cardiovascular Status Stable, Respiratory Function Stable, Patent Airway, No signs of Nausea or vomiting and Pain level controlled  Post-op Vital Signs: Reviewed and stable  Complications: No apparent anesthesia complications

## 2013-04-22 LAB — CBC
HEMATOCRIT: 40.4 % (ref 39.0–52.0)
Hemoglobin: 13.4 g/dL (ref 13.0–17.0)
MCH: 28.6 pg (ref 26.0–34.0)
MCHC: 33.2 g/dL (ref 30.0–36.0)
MCV: 86.3 fL (ref 78.0–100.0)
Platelets: 124 10*3/uL — ABNORMAL LOW (ref 150–400)
RBC: 4.68 MIL/uL (ref 4.22–5.81)
RDW: 15.6 % — ABNORMAL HIGH (ref 11.5–15.5)
WBC: 11.9 10*3/uL — AB (ref 4.0–10.5)

## 2013-04-22 LAB — BASIC METABOLIC PANEL
BUN: 14 mg/dL (ref 6–23)
CHLORIDE: 100 meq/L (ref 96–112)
CO2: 24 mEq/L (ref 19–32)
Calcium: 8.6 mg/dL (ref 8.4–10.5)
Creatinine, Ser: 1.06 mg/dL (ref 0.50–1.35)
GFR calc Af Amer: 77 mL/min — ABNORMAL LOW (ref >90)
GFR, EST NON AFRICAN AMERICAN: 66 mL/min — AB (ref >90)
Glucose, Bld: 158 mg/dL — ABNORMAL HIGH (ref 70–99)
POTASSIUM: 3.4 meq/L — AB (ref 3.7–5.3)
Sodium: 140 mEq/L (ref 137–147)

## 2013-04-22 NOTE — Care Management Note (Signed)
CARE MANAGEMENT NOTE 04/22/2013  Patient:  Steven Patton,Steven Patton   Account Number:  [id number]  Date Initiated:  04/22/2013  Documentation initiated by:  Vance PeperBRADY,Graviel Payeur  Subjective/Objective Assessment:   78 yr old male s/p right total hip arthroplasty.     Action/Plan:   Case manager spoke with patient concerning home health and DME needs at discharge. [facility], no changes. Has [facility] 3in1.CM faxed orders to [facility]/ called to inform them of pt discharge.   Anticipated DC Date:  04/22/2013   Anticipated DC Plan:  HOME W HOME HEALTH SERVICES      DC Planning Services  CM consult      [facility]   Choice offered to / List presented to:  C-1 Patient   DME arranged  NA        HH arranged  HH-2 PT      [facility]   Status of service:  Completed, signed off Medicare Important Message given?   (If response is "NO", the following Medicare IM given date fields will be blank) Date Medicare IM given:   Date Additional Medicare IM given:    Discharge Disposition:  HOME W HOME HEALTH SERVICES  Per UR Regulation:    If discussed at Long Length of Stay Meetings, dates discussed:    Comments:

## 2013-04-22 NOTE — Progress Notes (Signed)
Physical Therapy Treatment Patient Details Name: Steven Patton MRN: [id number] DOB: 03/06/1935 Today's Date: 04/22/2013 Time: [id number] PT Time Calculation (min): 23 min  PT Assessment / Plan / Recommendation  History of Present Illness Patient is a 78 yo male s/p Rt THA - anterior approach.   Patient with h/o Lt THA and Lt TKA both in 2014.   PT Comments   Pt. Progressed nicely with PT this am and is planning for DC later today.  I will see him a second time to continue to advance him but see no problems that would delay pm DC.  Follow Up Recommendations  Home health PT;Supervision/Assistance - 24 hour     Does the patient have the potential to tolerate intense rehabilitation     Barriers to Discharge        Equipment Recommendations  None recommended by PT    Recommendations for Other Services    Frequency 7X/week   Progress towards PT Goals Progress towards PT goals: Progressing toward goals  Plan Current plan remains appropriate    Precautions / Restrictions Precautions Precautions: None Restrictions Weight Bearing Restrictions: Yes RLE Weight Bearing: Weight bearing as tolerated   Pertinent Vitals/Pain See vitals tab Pt. Moving well with anterior hip soreness, "no pain"    Mobility  Bed Mobility Overal bed mobility:  (not assessed; pt. in recliner chair) Transfers Overall transfer level: Needs assistance Equipment used: Rolling walker (2 wheeled) Transfers: Sit to/from Stand Sit to Stand: Supervision General transfer comment: good technique and supervision for safety Ambulation/Gait Ambulation/Gait assistance: Supervision Ambulation Distance (Feet): 125 Feet Assistive device: Rolling walker (2 wheeled) Gait Pattern/deviations: Step-to pattern Gait velocity: slow but improving gait speed General Gait Details: Good sequencing and technique    Exercises Total Joint Exercises Ankle Circles/Pumps: AROM;Both;10 reps;Seated Quad Sets: AROM;Both;15 reps Gluteal  Sets: AROM;Both;15 reps Hip ABduction/ADduction: AAROM;Right;15 reps Long Arc Quad: AROM;Right;15 reps   PT Diagnosis:    PT Problem List:   PT Treatment Interventions:     PT Goals (current goals can now be found in the care plan section)    Visit Information  Last PT Received On: 04/22/13 Assistance Needed: +1 History of Present Illness: Patient is a 78 yo male s/p Rt THA - anterior approach.   Patient with h/o Lt THA and Lt TKA both in 2014.    Subjective Data  Subjective: Pt. repoprts mild soreness in R anterior hip but curently no pain   Cognition  Cognition Arousal/Alertness: Awake/alert Behavior During Therapy: WFL for tasks assessed/performed Overall Cognitive Status: Within Functional Limits for tasks assessed    Balance     End of Session PT - End of Session Equipment Utilized During Treatment: Gait belt Activity Tolerance: Patient tolerated treatment well Patient left: in chair;with call bell/phone within reach;with family/visitor present Nurse Communication: Mobility status   GP     Steven Patton, Steven Patton 04/22/2013, 10:19 AM Steven Patton PT Acute Rehab Services [phone] [place] [phone]

## 2013-04-22 NOTE — Progress Notes (Signed)
Physical Therapy Treatment Patient Details Name: Audree CamelHoyle Fisk MRN: [id number] DOB: 04/12/1935 Today's Date: 04/22/2013 Time: [id number] PT Time Calculation (min): 9 min  PT Assessment / Plan / Recommendation  History of Present Illness Patient is a 78 yo male s/p Rt THA - anterior approach.   Patient with h/o Lt THA and Lt TKA both in 2014.   PT Comments   Pt. With slight and occasional buckling of right knee during walk this pm.  Pt did not display evidence of pending fall but I instructed pt. And wife to be very alert to this and to reduce distance if it persists.  Pt. And wife agreeable and state their readiness to DC home with support of pt's daughter and son in law.  Follow Up Recommendations  Home health PT;Supervision/Assistance - 24 hour     Does the patient have the potential to tolerate intense rehabilitation     Barriers to Discharge        Equipment Recommendations  None recommended by PT    Recommendations for Other Services    Frequency 7X/week   Progress towards PT Goals Progress towards PT goals: Progressing toward goals  Plan Current plan remains appropriate    Precautions / Restrictions Precautions Precautions: None Restrictions Weight Bearing Restrictions: Yes RLE Weight Bearing: Weight bearing as tolerated   Pertinent Vitals/Pain See vitals tab Pain did not limit his mobility, denies pain    Mobility  Bed Mobility Overal bed mobility:  (pt. in recliner chair) Transfers Overall transfer level: Needs assistance Equipment used: Rolling walker (2 wheeled) Transfers: Sit to/from Stand Sit to Stand: Supervision Ambulation/Gait Ambulation/Gait assistance: Supervision Ambulation Distance (Feet): 125 Feet Assistive device: Rolling walker (2 wheeled) Gait Pattern/deviations: Step-to pattern General Gait Details: no overt LOB noted, did note slight buckling of R knee at times.  Alerted pt. and wife to be very cautious and monitor this in regards to safety in  ambulation    Exercises     PT Diagnosis:    PT Problem List:   PT Treatment Interventions:     PT Goals (current goals can now be found in the care plan section)    Visit Information  Last PT Received On: 04/22/13 Assistance Needed: +1 History of Present Illness: Patient is a 78 yo male s/p Rt THA - anterior approach.   Patient with h/o Lt THA and Lt TKA both in 2014.    Subjective Data  Subjective: Pt. presents dressed and sitting in recliner with family present.   Cognition  Cognition Arousal/Alertness: Awake/alert Behavior During Therapy: WFL for tasks assessed/performed Overall Cognitive Status: Within Functional Limits for tasks assessed    Balance     End of Session PT - End of Session Equipment Utilized During Treatment: Gait belt Activity Tolerance: Patient tolerated treatment well Patient left: in chair;with call bell/phone within reach;with family/visitor present Nurse Communication: Mobility status   GP     Ferman HammingBlankenship, Khori Rosevear B 04/22/2013, 3:05 PM Weldon PickingSusan Ignacia Gentzler PT Acute Rehab Services [phone] [place] [phone]

## 2013-04-22 NOTE — Progress Notes (Signed)
OT Cancellation Note  Patient Details Name: Audree CamelHoyle Aday MRN: [id number] DOB: 12/30/1935   Cancelled Treatment:    Reason Eval/Treat Not Completed: OT screened, no needs identified, will sign off.  This is the patient's second hip sx.  He is familiar with AE, but will rely on his wife for LB ADL and to supervise showering.  His bathroom is completely accessible.    Evern BioMayberry, Miangel Flom Lynn 04/22/2013, 11:04 AM [phone]

## 2013-04-22 NOTE — Progress Notes (Signed)
Subjective: 1 Day Post-Op Procedure(s) (LRB): TOTAL HIP ARTHROPLASTY ANTERIOR APPROACH (Right) Patient reports pain as 3 on 0-10 scale.   Patient doing well.  Ambulating in room.  Taking by mouth and voiding okay.  Positive flatus.  Objective: Vital signs in last 24 hours: Temp:  [97.5 F (36.4 C)-99.3 F (37.4 C)] 99.3 F (37.4 C) (03/21 0500) Pulse Rate:  [71-90] 90 (03/21 0500) Resp:  [9-19] 16 (03/21 0500) BP: (98-144)/(53-73) 127/53 mmHg (03/21 1111) SpO2:  [86 %-97 %] 94 % (03/21 0500) Weight:  [127.07 kg (280 lb 2.2 oz)] 127.07 kg (280 lb 2.2 oz) (03/20 1700)  Intake/Output from previous day: 03/20 0701 - 03/21 0700 In: 1650 [I.V.:1650] Out: 550 [Urine:200; Blood:350] Intake/Output this shift:     Recent Labs  04/22/13 0615  HGB 13.4    Recent Labs  04/22/13 0615  WBC 11.9*  RBC 4.68  HCT 40.4  PLT 124*    Recent Labs  04/22/13 0615  NA 140  K 3.4*  CL 100  CO2 24  BUN 14  CREATININE 1.06  GLUCOSE 158*  CALCIUM 8.6   Right hip exam: Neurovascular intact Sensation intact distally Intact pulses distally Dorsiflexion/Plantar flexion intact Incision: dressing C/D/I Compartment soft  Assessment/Plan: 1 Day Post-Op Procedure(s) (LRB): TOTAL HIP ARTHROPLASTY ANTERIOR APPROACH (Right) Plan: Leave dressing in place x2 weeks. Discharge home with home health Rx for Percocet 5 mg and aspirin 325 mg twice daily x1 month.  (DVT prophylaxis.) WBAT on right without hip precautions. Followup with Dr. Luiz BlareGraves in 2 weeks. Jerik Falletta G 04/22/2013, 11:36 AM

## 2013-04-22 NOTE — Discharge Summary (Signed)
Patient ID: Ki Corbo MRN: [id number] DOB/AGE: [birth year] 76 y.o.  Admit date: 04/21/2013 Discharge date: 04/22/2013  Admission Diagnoses:  Principal Problem:   Osteoarthritis of right hip   Discharge Diagnoses:  Same  Past Medical History  Diagnosis Date  . Cataract   . Macular degeneration, left eye   . Arthritis   . Sleep apnea     2008.Marland KitchenMarland KitchenWEARS CPAP  . Hypertension     takes Amlodipine,Lisinopril,and Metoprolol daily    Surgeries: Procedure(s):RIGHT TOTAL HIP ARTHROPLASTY ANTERIOR APPROACH on 04/21/2013      Discharged Condition: Improved  Hospital Course: Rees Santistevan is an 78 y.o. male who was admitted 04/21/2013 for operative treatment ofOsteoarthritis of right hip. Patient has severe unremitting pain that affects sleep, daily activities, and work/hobbies. After pre-op clearance the patient was taken to the operating room on 04/21/2013 and underwent  Procedure(s):RIGHT TOTAL HIP ARTHROPLASTY ANTERIOR APPROACH.    Patient was given perioperative antibiotics: Anti-infectives   Start     Dose/Rate Route Frequency Ordered Stop   04/21/13 1800  ceFAZolin (ANCEF) IVPB 2 g/50 mL premix     2 g 100 mL/hr over 30 Minutes Intravenous Every 6 hours 04/21/13 1636 04/22/13 0039   04/21/13 0600  ceFAZolin (ANCEF) 3 g in dextrose 5 % 50 mL IVPB     3 g 160 mL/hr over 30 Minutes Intravenous On call to O.R. 04/20/13 1355 04/21/13 1209       Patient was given sequential compression devices, early ambulation, and chemoprophylaxis to prevent DVT.  Patient benefited maximally from hospital stay and there were no complications.    Recent vital signs: Patient Vitals for the past 24 hrs:  BP Temp Temp src Pulse Resp SpO2 Height Weight  04/22/13 1111 127/53 mmHg - - - - - - -  04/22/13 0500 98/58 mmHg 99.3 F (37.4 C) - 90 16 94 % - -  04/21/13 2012 110/67 mmHg 97.5 F (36.4 C) - 86 16 91 % - -  04/21/13 1700 - - - - - - 5\' 9"  (1.753 m) 127.07 kg (280 lb 2.2 oz)  04/21/13 1630  144/62 mmHg 97.7 F (36.5 C) Oral - 16 96 % - -  04/21/13 1609 143/73 mmHg - - 76 17 96 % - -  04/21/13 1600 - 97.7 F (36.5 C) - 73 13 96 % - -  04/21/13 1554 131/66 mmHg - - 71 14 97 % - -  04/21/13 1545 - - - 77 15 95 % - -  04/21/13 1539 124/57 mmHg - - 74 19 95 % - -  04/21/13 1530 - - - 73 13 95 % - -  04/21/13 1527 129/57 mmHg - - 78 15 94 % - -  04/21/13 1515 - - - 79 16 96 % - -  04/21/13 1509 139/66 mmHg - - 78 18 95 % - -  04/21/13 1500 - - - 75 9 92 % - -  04/21/13 1455 - 98.2 F (36.8 C) - - - - - -  04/21/13 1454 122/60 mmHg - - 78 10 86 % - -     Recent laboratory studies:  Recent Labs  04/22/13 0615  WBC 11.9*  HGB 13.4  HCT 40.4  PLT 124*  NA 140  K 3.4*  CL 100  CO2 24  BUN 14  CREATININE 1.06  GLUCOSE 158*  CALCIUM 8.6     Discharge Medications:     Medication List    STOP taking these medications  meloxicam 15 MG tablet  Commonly known as:  MOBIC      TAKE these medications       amLODipine 10 MG tablet  Commonly known as:  NORVASC  Take 1 tablet (10 mg total) by mouth daily.     aspirin 325 MG EC tablet  Take 1 tablet (325 mg total) by mouth 2 (two) times daily after a meal.     carvedilol 25 MG tablet  Commonly known as:  COREG  Take 0.5 tablets (12.5 mg total) by mouth 2 (two) times daily with a meal.     gabapentin 600 MG tablet  Commonly known as:  NEURONTIN  Take 1 tablet (600 mg total) by mouth 3 (three) times daily.     lisinopril-hydrochlorothiazide 20-25 MG per tablet  Commonly known as:  PRINZIDE,ZESTORETIC  Take 1 tablet by mouth daily.     methocarbamol 500 MG tablet  Commonly known as:  ROBAXIN-750  Take 1.5 tablets (750 mg total) by mouth every 8 (eight) hours as needed for muscle spasms.     nitroGLYCERIN 0.4 MG SL tablet  Commonly known as:  NITROSTAT  Place 0.4 mg under the tongue every 5 (five) minutes as needed for chest pain.     oxyCODONE-acetaminophen 5-325 MG per tablet  Commonly known as:   PERCOCET/ROXICET  Take 1-2 tablets by mouth every 6 (six) hours as needed for severe pain.     phentermine 37.5 MG capsule  Take 1 capsule (37.5 mg total) by mouth every morning.        Diagnostic Studies: Dg Chest 2 View  04/14/2013   CLINICAL DATA:  Preop for right hip replacement. History of hypertension.  EXAM: CHEST  2 VIEW  COMPARISON:  06/08/2012  FINDINGS: Cardiac silhouette is normal in size. Aorta is uncoiled. No mediastinal or hilar masses minor reticular lung base opacity likely scarring, subsegmental atelectasis or a combination. Lungs are otherwise clear.  No pleural effusion.  No pneumothorax.  The bony thorax is intact.  IMPRESSION: No active cardiopulmonary disease.   Electronically Signed   By: Amie Portlandavid  Ormond M.D.   On: 04/14/2013 14:36   Dg Hip Operative Right  04/21/2013   CLINICAL DATA:  Total hip arthroplasty from anterior approach.  EXAM: DG OPERATIVE RIGHT HIP  TECHNIQUE: A single spot fluoroscopic AP image of the right hip is submitted.  COMPARISON:  None.  FINDINGS: Single AP View which demonstrates a right hip arthroplasty. No periprosthetic fracture or other acute hardware complication identified.  IMPRESSION: Intraoperative imaging of right hip arthroplasty.   Electronically Signed   By: Jeronimo GreavesKyle  Talbot M.D.   On: 04/21/2013 14:40   Dg Pelvis Portable  04/21/2013   CLINICAL DATA:  Postop right hip arthroplasty.  EXAM: PORTABLE PELVIS 1-2 VIEWS  COMPARISON:  01/06/2013  FINDINGS: Sequelae of interval right total hip arthroplasty are identified. The prosthetic femoral and acetabular components appear well seated. Limited evaluation of prior left total hip arthroplasty is unremarkable. A well corticated osseous fragment is present at the superolateral margin of the right acetabulum. Marked degenerative disc disease is noted at the lumbosacral junction.  IMPRESSION: Interval right total hip arthroplasty without radiographic evidence of hardware complication.   Electronically  Signed   By: Sebastian AcheAllen  Grady   On: 04/21/2013 16:37   Dg Hip Portable 1 View Right  04/21/2013   CLINICAL DATA:  Postop right total hip arthroplasty.  EXAM: PORTABLE RIGHT HIP - 1 VIEW  COMPARISON:  01/06/2013 pelvis radiograph  FINDINGS: Single  portable cross-table lateral radiograph of the right hip is provided. Sequelae of interval right total hip arthroplasty are identified. The prosthetic femoral and acetabular components appear well seated. No periprosthetic fracture is seen.  IMPRESSION: Right total hip arthroplasty without radiographic evidence of complication.   Electronically Signed   By: Sebastian Ache   On: 04/21/2013 16:38    Disposition: 01-Home or Self Care      Discharge Orders   Future Appointments Provider Department Dept Phone   05/12/2013 9:00 AM Monica Becton, MD [facility] [place] [phone]   Future Orders Complete By Expires   Call MD / Call 911  As directed    Comments:     If you experience chest pain or shortness of breath, CALL 911 and be transported to the hospital emergency room.  If you develope a fever above 101 F, pus (white drainage) or increased drainage or redness at the wound, or calf pain, call your surgeon's office.   Constipation Prevention  As directed    Comments:     Drink plenty of fluids.  Prune juice may be helpful.  You may use a stool softener, such as Colace (over the counter) 100 mg twice a day.  Use MiraLax (over the counter) for constipation as needed.   Diet - low sodium heart healthy  As directed    Follow the hip precautions as taught in Physical Therapy  As directed    Increase activity slowly as tolerated  As directed    Weight bearing as tolerated  As directed    Questions:     Laterality:  right   Extremity:  Lower      Follow-up Information   Follow up with GRAVES,JOHN L, MD. Schedule an appointment as soon as possible for a visit in 2 weeks.   Specialty:  Orthopedic Surgery   Contact  information:   [address] [place] [address] [phone]        Signed: Matthew Folks 04/22/2013, 11:41 AM

## 2013-04-22 NOTE — Op Note (Signed)
NAMAudree Camel:  Patton, Steven Patton               ACCOUNT NO.:  [id number]  MEDICAL RECORD NO.:  [id number]  LOCATION:  [id number]                        FACILITY:  [facility]  PHYSICIAN:  Harvie JuniorJohn L. Shemar Plemmons, M.D.   DATE OF BIRTH:  11-17-1935  DATE OF PROCEDURE:  04/21/2013 DATE OF DISCHARGE:                              OPERATIVE REPORT   PREOPERATIVE DIAGNOSIS:  End-stage degenerative joint disease, right hip.  POSTOPERATIVE DIAGNOSIS:  End-stage degenerative joint disease, right hip.  PRINCIPAL PROCEDURE:  Right total hip replacement from an anterior approach.  SURGEON:  Harvie JuniorJohn L. Annessa Satre, M.D.  ASSISTANT:  Marshia LyJames Bethune, P.A.  ANESTHESIA:  General.  BRIEF HISTORY:  [name] is a 78 year old male with a long history of having severe bilateral degenerative joint disease of the hips.  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  Right hip was of bone-on-bone and once he recovered from his left hip, we felt that we could do his right hip from an anterior approach, and we had discussions with him about the difference and perhaps trying to limit or minimize recovery time and after discussion, he elected to undergo right total hip replacement.  He was brought to the operating room for this procedure.  DESCRIPTION OF PROCEDURE:  The patient was brought to the operating room.  After adequate anesthesia was obtained with general anesthetic, the patient was placed supine on the operating table.  The right leg was then prepped and draped in usual sterile fashion.  Following this, the patient was [facility] and all preparations were made. The right hip was then prepped and draped in usual sterile fashion, and after this, an incision was made just lateral and distal to the anterior superior iliac spine down towards the femur over the overlying tensor fascia.  Once this was done, the fascia of the tensor  fascia was opened and the muscle was finger dissected off the superior limb.  Once this was done, retractors were put in place superior and anterior to the head.  The capsule was clearly identified and opened.  Tag sutures were used superiorly and anteriorly, and at this point, the retractors were placed inside the hip capsule.  The hip was then dislocated with external rotation and the use of a drill and awl, and once this was done, the hip was re-reduced and under fluoroscopic guidance, a provisional neck cut was made.  Following this, the head was removed, sized on the back table, and retractors were put in place in the acetabulum, and after labrectomy, the acetabulum was sequentially reamed to a level of 51 mm and the 52-mm pinnacle cup was placed, tried to put a hole eliminator for some reason and kept stripping out of the hole at that point, which was felt that we would go without the hole eliminator. A +4 neutral liner was then placed and attention was turned towards the femur.  The [facility] was used to bring the femur up into the wound and after external rotation and release of the posterior portion of the capsule and piriformis, we were  able to get into the canal, sequentially rasped up to a level of size 13 Corail stem and a trial was then used, reduced.  Fluoroscopic images were taken to get symmetric leg lengths and offset.  Once this was done, the trials were removed and the final Corail stem 13 was put into place.  We used a -2 36-mm hip ball and this was placed and the hip put through range of motion.  Excess stability was achieved with extension and external rotation, and at this point, we used the fluoro to look at the leg lengths, or within few millimeters of leg length at this point. Following this, the wound was irrigated and suctioned dry.  The anterior capsule was closed with interrupted sutures and a running suture.  The fascia of the tensor was then  closed with 1 Vicryl running, skin with a 0, 2-0 Vicryl and 3-0 Monocryl subcuticular.  Benzoin and Steri-Strips were applied.  Sterile compressive dressing was applied, and the patient was taken to the recovery room, he was noted to be in satisfactory condition.  Estimated blood loss for the procedure was less than 500 mL.     Harvie Junior, M.D.     Ranae Plumber  D:  04/21/2013  T:  04/22/2013  Job:  [id number]

## 2013-04-25 ENCOUNTER — Encounter (HOSPITAL_COMMUNITY): Payer: Self-pay | Admitting: Orthopedic Surgery

## 2013-05-12 ENCOUNTER — Encounter: Payer: Self-pay | Admitting: Sports Medicine

## 2013-05-12 ENCOUNTER — Ambulatory Visit (INDEPENDENT_AMBULATORY_CARE_PROVIDER_SITE_OTHER): Payer: Medicare PPO | Admitting: Sports Medicine

## 2013-05-12 VITALS — BP 119/65 | HR 66 | Resp 18 | Ht 68.5 in | Wt 286.0 lb

## 2013-05-12 DIAGNOSIS — Z0000 Encounter for general adult medical examination without abnormal findings: Secondary | ICD-10-CM

## 2013-05-12 DIAGNOSIS — Z1211 Encounter for screening for malignant neoplasm of colon: Secondary | ICD-10-CM

## 2013-05-12 LAB — POC HEMOCCULT BLD/STL (OFFICE/1-CARD/DIAGNOSTIC): Fecal Occult Blood, POC: NEGATIVE

## 2013-05-12 NOTE — Progress Notes (Signed)
Subjective:    Steven Patton is a 78 y.o. male who presents for Medicare Annual/Subsequent preventive examination.   Preventive Screening-Counseling & Management  Tobacco History  Smoking status  . Former Smoker -- 1.00 packs/day for 20 years  . Types: Cigarettes  . Quit date: 01/04/1993  Smokeless tobacco  . Former NeurosurgeonUser  . Types: Chew  . Quit date: 01/04/1993    Comment: quit [id number] ago    Problems Prior to Visit 1.   Current Problems (verified) Patient Active Problem List   Diagnosis Date Noted  . Osteoarthritis of right hip 04/21/2013  . Preop cardiovascular exam 07/27/2012  . Elevated white blood cell count 07/27/2012  . Obesity 06/20/2012  . Chest pain 06/08/2012  . H/O left total hip arthroplasty 05/18/2012  . Lumbar stenosis with neurogenic claudication 01/29/2012  . Knee osteoarthritis 01/18/2012  . Hypertension 01/04/2012  . Lower extremity edema 01/04/2012  . Seborrheic keratosis 01/04/2012  . Hyperlipidemia 01/04/2012  . Preventive measure 01/04/2012  . Sleep apnea 01/04/2012    Medications Prior to Visit Current Outpatient Prescriptions on File Prior to Visit  Medication Sig Dispense Refill  . amLODipine (NORVASC) 10 MG tablet Take 1 tablet (10 mg total) by mouth daily.  90 tablet  3  . aspirin EC 325 MG EC tablet Take 1 tablet (325 mg total) by mouth 2 (two) times daily after a meal.  30 tablet  0  . carvedilol (COREG) 25 MG tablet Take 0.5 tablets (12.5 mg total) by mouth 2 (two) times daily with a meal.  60 tablet  3  . gabapentin (NEURONTIN) 600 MG tablet Take 1 tablet (600 mg total) by mouth 3 (three) times daily.  180 tablet  3  . lisinopril-hydrochlorothiazide (PRINZIDE,ZESTORETIC) 20-25 MG per tablet Take 1 tablet by mouth daily.  90 tablet  3  . methocarbamol (ROBAXIN-750) 500 MG tablet Take 1.5 tablets (750 mg total) by mouth every 8 (eight) hours as needed for muscle spasms.  40 tablet  0  . nitroGLYCERIN (NITROSTAT) 0.4 MG SL tablet Place 0.4  mg under the tongue every 5 (five) minutes as needed for chest pain.      . phentermine 37.5 MG capsule Take 1 capsule (37.5 mg total) by mouth every morning.  30 capsule  0   No current facility-administered medications on file prior to visit.    Current Medications (verified) Current Outpatient Prescriptions  Medication Sig Dispense Refill  . amLODipine (NORVASC) 10 MG tablet Take 1 tablet (10 mg total) by mouth daily.  90 tablet  3  . aspirin EC 325 MG EC tablet Take 1 tablet (325 mg total) by mouth 2 (two) times daily after a meal.  30 tablet  0  . carvedilol (COREG) 25 MG tablet Take 0.5 tablets (12.5 mg total) by mouth 2 (two) times daily with a meal.  60 tablet  3  . gabapentin (NEURONTIN) 600 MG tablet Take 1 tablet (600 mg total) by mouth 3 (three) times daily.  180 tablet  3  . lisinopril-hydrochlorothiazide (PRINZIDE,ZESTORETIC) 20-25 MG per tablet Take 1 tablet by mouth daily.  90 tablet  3  . methocarbamol (ROBAXIN-750) 500 MG tablet Take 1.5 tablets (750 mg total) by mouth every 8 (eight) hours as needed for muscle spasms.  40 tablet  0  . nitroGLYCERIN (NITROSTAT) 0.4 MG SL tablet Place 0.4 mg under the tongue every 5 (five) minutes as needed for chest pain.      . phentermine 37.5 MG capsule Take 1 capsule (  37.5 mg total) by mouth every morning.  30 capsule  0   No current facility-administered medications for this visit.     Allergies (verified) Review of patient's allergies indicates no known allergies.   PAST HISTORY  Family History Family History  Problem Relation Age of Onset  . Cancer Daughter     Social History History  Substance Use Topics  . Smoking status: Former Smoker -- 1.00 packs/day for 20 years    Types: Cigarettes    Quit date: 01/04/1993  . Smokeless tobacco: Former Neurosurgeon    Types: Chew    Quit date: 01/04/1993     Comment: quit 4yrs ago  . Alcohol Use: No    Are there smokers in your home (other than you)?  Yes  Risk Factors Current  exercise habits: The patient does not participate in regular exercise at present.  Dietary issues discussed: Yes   Cardiac risk factors: advanced age (older than 94 for men, 48 for women), hypertension, obesity (BMI >= 30 kg/m2) and sedentary lifestyle.  Depression Screen (Note: if answer to either of the following is "Yes", a more complete depression screening is indicated)   Q1: Over the past two weeks, have you felt down, depressed or hopeless? No  Q2: Over the past two weeks, have you felt little interest or pleasure in doing things? Yes (Been in bed from recent hip replacement)  Have you lost interest or pleasure in daily life? No  Do you often feel hopeless? No  Do you cry easily over simple problems? No  Activities of Daily Living In your present state of health, do you have any difficulty performing the following activities?:  Driving? No Managing money?  No Feeding yourself? No Getting from bed to chair? No Climbing a flight of stairs? No Preparing food and eating?: No Bathing or showering? No Getting dressed: No Getting to the toilet? No Using the toilet:No Moving around from place to place: No In the past year have you fallen or had a near fall?:No   Are you sexually active?  No  Do you have more than one partner?  No  Hearing Difficulties: Yes Do you often ask people to speak up or repeat themselves? Yes Do you experience ringing or noises in your ears? No Do you have difficulty understanding soft or whispered voices? Yes   Do you feel that you have a problem with memory? No  Do you often misplace items? No  Do you feel safe at home?  Yes  Cognitive Testing  Alert? Yes  Normal Appearance?Yes  Oriented to person? Yes  Place? Yes   Time? Yes  Recall of three objects?  Yes  Can perform simple calculations? Yes  Displays appropriate judgment?Yes  Can read the correct time from a watch face?Yes   Advanced Directives have been discussed with the patient?  Yes   List the Names of Other Physician/Practitioners you currently use: 1.    Indicate any recent Medical Services you may have received from other than [facility] providers in the past year (date may be approximate).  Immunization History  Administered Date(s) Administered  . Influenza, Seasonal, Injecte, Preservative Fre 01/04/2012  . Influenza,inj,Quad PF,36+ Mos 11/15/2012  . Pneumococcal Polysaccharide-23 11/03/2010  . Tdap 01/04/2012  . Zoster 10/04/2011    Screening Tests Health Maintenance  Topic Date Due  . Influenza Vaccine  09/02/2013  . Colonoscopy  02/03/2016  . Tetanus/tdap  01/03/2022  . Pneumococcal Polysaccharide Vaccine Age 29 And Over  Completed  .  Zostavax  Completed    All answers were reviewed with the patient and necessary referrals were made:  Rodney Langton, MD   05/12/2013   History reviewed: allergies, current medications, past family history, past medical history, past social history, past surgical history and problem list  Review of Systems A comprehensive review of systems was negative.    Objective:     Vision by Snellen chart: right eye:20/20, left eye:20/20 Blood pressure 119/65, pulse 66, resp. rate 18, height 5' 8.5" (1.74 m), weight 286 lb (129.729 kg). Body mass index is 42.85 kg/(m^2).  General Appearance: Alert, well developed and well nourished, cooperative, no distress.  Head: Normocephalic, no obvious abnormality  Eyes: PERRL, EOM's intact, conjunctiva and corneas clear.  Nose: Nares symmetrical, septum midline, mucosa pink, clear watery discharge; no sinus tenderness  Throat: Lips, tongue, and mucosa are moist, pink, and intact; teeth intact  Neck: Supple, symmetrical, trachea midline, no adenopathy; thyroid: no enlargement, symmetric,no tenderness/mass/nodules; no carotid bruit, no JVD  Back: Symmetrical, no curvature, ROM normal, no CVA tenderness  Chest/Breast: No mass or tenderness  Lungs: Clear to auscultation  bilaterally, respirations unlabored  Heart: Normal PMI, no lower extremity edema, regular rate & rhythm, S1 and S2 normal, no murmurs, rubs, or gallops. Abdomen: Soft, non-tender, bowel sounds active all four quadrants, no mass, or organomegaly  Musculoskeletal: All joints, extremities examined, non-tender and unremarkable.  Lymphatic: No adenopathy  Skin/Hair/Nails: Skin warm, dry, and intact, no rashes or abnormal dyspigmentation  Neurologic: Alert and oriented x3, no cranial nerve deficits, normal strength and tone, gait steady   Hemoccults negative.     Assessment:     Healthy Male      Plan:     During the course of the visit the patient was educated and counseled about appropriate screening and preventive services including:    Advanced directives: has an advanced directive - a copy has been provided  Diet review for nutrition referral? Yes ____  Not Indicated __x__   Patient Instructions (the written plan) was given to the patient.  Medicare Attestation I have personally reviewed: The patient's medical and social history Their use of alcohol, tobacco or illicit drugs Their current medications and supplements The patient's functional ability including ADLs,fall risks, home safety risks, cognitive, and hearing and visual impairment Diet and physical activities Evidence for depression or mood disorders  The patient's weight, height, BMI, and visual acuity have been recorded in the chart.  I have made referrals, counseling, and provided education to the patient based on review of the above and I have provided the patient with a written personalized care plan for preventive services.     Rodney Langton, MD   05/12/2013

## 2013-05-12 NOTE — Assessment & Plan Note (Signed)
Medicare physical performed today. Return in one year.

## 2013-05-16 ENCOUNTER — Ambulatory Visit: Payer: Medicare PPO | Admitting: Sports Medicine

## 2013-06-06 ENCOUNTER — Telehealth: Payer: Self-pay

## 2013-06-06 NOTE — Telephone Encounter (Signed)
Patient wife called stated patient does not have appetite running a low grade fever, and pressure when he tries to  Urinate and feeling weak.  Its been going. on at least for a couple days.She has given him cranberry juice and he is eating jello. Patient was transferred to front to schedule appt to be evaluated. Koleman Marling,CMA

## 2013-06-07 ENCOUNTER — Encounter: Payer: Self-pay | Admitting: Sports Medicine

## 2013-06-07 ENCOUNTER — Ambulatory Visit (INDEPENDENT_AMBULATORY_CARE_PROVIDER_SITE_OTHER): Payer: Medicare PPO | Admitting: Sports Medicine

## 2013-06-07 VITALS — BP 116/62 | HR 96 | Temp 98.1°F | Wt 295.0 lb

## 2013-06-07 DIAGNOSIS — N390 Urinary tract infection, site not specified: Secondary | ICD-10-CM | POA: Insufficient documentation

## 2013-06-07 DIAGNOSIS — R3919 Other difficulties with micturition: Secondary | ICD-10-CM

## 2013-06-07 DIAGNOSIS — R319 Hematuria, unspecified: Secondary | ICD-10-CM

## 2013-06-07 DIAGNOSIS — R509 Fever, unspecified: Secondary | ICD-10-CM

## 2013-06-07 DIAGNOSIS — R82998 Other abnormal findings in urine: Secondary | ICD-10-CM

## 2013-06-07 DIAGNOSIS — M549 Dorsalgia, unspecified: Secondary | ICD-10-CM

## 2013-06-07 DIAGNOSIS — R39198 Other difficulties with micturition: Secondary | ICD-10-CM

## 2013-06-07 DIAGNOSIS — R34 Anuria and oliguria: Secondary | ICD-10-CM

## 2013-06-07 LAB — POCT URINALYSIS DIPSTICK
Glucose, UA: NEGATIVE
Nitrite, UA: POSITIVE
Protein, UA: >=300
Spec Grav, UA: >=1.03
Urobilinogen, UA: 2
pH, UA: 5.5

## 2013-06-07 MED ORDER — CEPHALEXIN 500 MG PO CAPS: 500.0000 mg | ORAL_CAPSULE | Freq: Two times a day (BID) | ORAL | Status: DC

## 2013-06-07 MED ORDER — PHENAZOPYRIDINE HCL 200 MG PO TABS: 200.0000 mg | ORAL_TABLET | Freq: Three times a day (TID) | ORAL | Status: AC

## 2013-06-07 NOTE — Assessment & Plan Note (Signed)
Urine culture. Nitrites positive so suspect Escherichia coli.  Constipated, adding Linzess. Cephalexin, Pyridium, no signs of prostatitis or pyelonephritis. Return if no better in one week.

## 2013-06-07 NOTE — Progress Notes (Signed)
  Subjective:    CC: Difficulty urinating  HPI: The past several days this pleasant 78 year old male has had difficulty urination with burning, and frequency. He denies any flank pain, abdominal pain, fevers, chills, nausea, vomiting. He is feeling somewhat weak. Symptoms are moderate, persistent.  Past medical history, Surgical history, Family history not pertinant except as noted below, Social history, Allergies, and medications have been entered into the medical record, reviewed, and no changes needed.   Review of Systems: No fevers, chills, night sweats, weight loss, chest pain, or shortness of breath.   Objective:    General: Well Developed, well nourished, and in no acute distress.  Neuro: Alert and oriented x3, extra-ocular muscles intact, sensation grossly intact.  HEENT: Normocephalic, atraumatic, pupils equal round reactive to light, neck supple, no masses, no lymphadenopathy, thyroid nonpalpable.  Skin: Warm and dry, no rashes. Cardiac: Regular rate and rhythm, no murmurs rubs or gallops, no lower extremity edema.  Respiratory: Clear to auscultation bilaterally. Not using accessory muscles, speaking in full sentences. Abdomen: Soft, nontender, nondistended, no suprapubic pain, no costovertebral angle tenderness.  Urinalysis is positive for nitrites and leukocytes.  Impression and Recommendations:

## 2013-06-11 LAB — URINE CULTURE: Colony Count: >=100000

## 2013-06-28 ENCOUNTER — Encounter: Payer: Self-pay | Admitting: Sports Medicine

## 2013-06-28 ENCOUNTER — Ambulatory Visit (INDEPENDENT_AMBULATORY_CARE_PROVIDER_SITE_OTHER): Payer: Medicare PPO | Admitting: Sports Medicine

## 2013-06-28 VITALS — BP 144/68 | HR 60 | Wt 291.0 lb

## 2013-06-28 DIAGNOSIS — R350 Frequency of micturition: Secondary | ICD-10-CM

## 2013-06-28 DIAGNOSIS — N390 Urinary tract infection, site not specified: Secondary | ICD-10-CM

## 2013-06-28 LAB — POCT URINALYSIS DIPSTICK
Bilirubin, UA: NEGATIVE
Glucose, UA: NEGATIVE
Ketones, UA: NEGATIVE
Nitrite, UA: POSITIVE
Protein, UA: NEGATIVE
Spec Grav, UA: 1.025
Urobilinogen, UA: 0.2
pH, UA: 6

## 2013-06-28 MED ORDER — CIPROFLOXACIN HCL 750 MG PO TABS: 750.0000 mg | ORAL_TABLET | Freq: Two times a day (BID) | ORAL | Status: DC

## 2013-06-28 NOTE — Progress Notes (Signed)
  Subjective:    CC: Urinary symptoms.  HPI:  This is a very pleasant 78 year old male, for the past several days he's noted recurrent increase in malodorous urination, with possible increase in frequency. I treated him for urinary tract infection earlier this month, with 7 days of Keflex. Symptoms are now persistent, mild. No systemic symptoms, fevers, chills.  Past medical history, Surgical history, Family history not pertinant except as noted below, Social history, Allergies, and medications have been entered into the medical record, reviewed, and no changes needed.   Review of Systems: No fevers, chills, night sweats, weight loss, chest pain, or shortness of breath.   Objective:    General: Well Developed, well nourished, and in no acute distress.  Neuro: Alert and oriented x3, extra-ocular muscles intact, sensation grossly intact.  HEENT: Normocephalic, atraumatic, pupils equal round reactive to light, neck supple, no masses, no lymphadenopathy, thyroid nonpalpable.  Skin: Warm and dry, no rashes. Cardiac: Regular rate and rhythm, no murmurs rubs or gallops, no lower extremity edema.  Respiratory: Clear to auscultation bilaterally. Not using accessory muscles, speaking in full sentences. Abdomen: Soft, nontender, nondistended, normal bowel sounds, no palpable masses, no costovertebral anal tenderness.  Urinalysis is positive for nitrites and leukocytes.  Impression and Recommendations:

## 2013-06-28 NOTE — Assessment & Plan Note (Signed)
This is the second urinary tract infection this month. We are going to do a prolonged course of antibiotics Cipro for 14 days this time, if recurs again, we will proceed with imaging.

## 2013-07-01 LAB — URINE CULTURE: Colony Count: >=100000

## 2013-07-26 ENCOUNTER — Telehealth: Payer: Self-pay | Admitting: *Deleted

## 2013-07-26 ENCOUNTER — Other Ambulatory Visit: Payer: Self-pay | Admitting: *Deleted

## 2013-07-26 ENCOUNTER — Encounter: Payer: Self-pay | Admitting: Sports Medicine

## 2013-07-26 ENCOUNTER — Ambulatory Visit (INDEPENDENT_AMBULATORY_CARE_PROVIDER_SITE_OTHER): Payer: Medicare PPO | Admitting: Sports Medicine

## 2013-07-26 VITALS — BP 134/76 | HR 72 | Ht 69.0 in | Wt 297.0 lb

## 2013-07-26 DIAGNOSIS — R8281 Pyuria: Secondary | ICD-10-CM

## 2013-07-26 DIAGNOSIS — Z01812 Encounter for preprocedural laboratory examination: Secondary | ICD-10-CM

## 2013-07-26 DIAGNOSIS — N10 Acute tubulo-interstitial nephritis: Secondary | ICD-10-CM

## 2013-07-26 DIAGNOSIS — R600 Localized edema: Secondary | ICD-10-CM

## 2013-07-26 DIAGNOSIS — R609 Edema, unspecified: Secondary | ICD-10-CM

## 2013-07-26 LAB — BUN: BUN: 14 mg/dL (ref 6–23)

## 2013-07-26 LAB — CREATININE, SERUM: Creat: 1.25 mg/dL (ref 0.50–1.35)

## 2013-07-26 MED ORDER — AMBULATORY NON FORMULARY MEDICATION: Status: DC

## 2013-07-26 MED ORDER — FUROSEMIDE 40 MG PO TABS: 40.0000 mg | ORAL_TABLET | Freq: Every day | ORAL | Status: DC

## 2013-07-26 MED ORDER — NITROFURANTOIN MONOHYD MACRO 100 MG PO CAPS: 100.0000 mg | ORAL_CAPSULE | Freq: Two times a day (BID) | ORAL | Status: DC

## 2013-07-26 NOTE — Telephone Encounter (Signed)
Prior auth for ct abd/pelvis with IV contrast - [id number] as per Rella Larveerri W.

## 2013-07-26 NOTE — Progress Notes (Signed)
  Subjective:    CC: Followup  HPI: Steven GallowayMickey returns, he has had a chronic urinary tract infection. He has recently finished 14 days of ciprofloxacin, feels significantly better but still has a malodorous urine. He denies much abdominal pain, has a little bit of suprapubic pain, and no flank pain anymore like he had before. No fevers, chills, or other constitutional symptoms. Urine culture grew out pansensitive Escherichia coli.  Past medical history, Surgical history, Family history not pertinant except as noted below, Social history, Allergies, and medications have been entered into the medical record, reviewed, and no changes needed.   Review of Systems: No fevers, chills, night sweats, weight loss, chest pain, or shortness of breath.   Objective:    General: Well Developed, well nourished, and in no acute distress.  Neuro: Alert and oriented x3, extra-ocular muscles intact, sensation grossly intact.  HEENT: Normocephalic, atraumatic, pupils equal round reactive to light, neck supple, no masses, no lymphadenopathy, thyroid nonpalpable.  Skin: Warm and dry, no rashes. Cardiac: Regular rate and rhythm, no murmurs rubs or gallops, no lower extremity edema.  Respiratory: Clear to auscultation bilaterally. Not using accessory muscles, speaking in full sentences. Abdomen: Soft, minimally tender palpation of the suprapubic region, no costovertebral angle tenderness, no guarding, rebound tenderness, or rigidity.  Impression and Recommendations:

## 2013-07-26 NOTE — Assessment & Plan Note (Signed)
Lower extremity compression hose. Lasix daily for a week.

## 2013-07-26 NOTE — Telephone Encounter (Signed)
[facility] going to come in for STAT labs and go to radiology to pick up his prep. Corliss SkainsJamie Painter, CMA

## 2013-07-26 NOTE — Assessment & Plan Note (Signed)
Improved significantly after 2 weeks of ciprofloxacin. Persistent abnormal odor however dysuria has resolved. He is a complicated case, and has had persistent pyuria. I'm going to add an additional 14 days of nitrofurantoin, cultures grew out pansensitive Escherichia coli. Nitrofurantoin has excellent concentration in the urinary system. We are also going to obtain a CT with IV contrast looking for any collection near the kidneys.

## 2013-07-27 ENCOUNTER — Ambulatory Visit (HOSPITAL_BASED_OUTPATIENT_CLINIC_OR_DEPARTMENT_OTHER)
Admission: RE | Admit: 2013-07-27 | Discharge: 2013-07-27 | Disposition: A | Discharge status: Home / Self Care | Payer: Medicare PPO | Source: Ambulatory Visit | Attending: Sports Medicine | Admitting: Sports Medicine

## 2013-07-27 DIAGNOSIS — R82998 Other abnormal findings in urine: Secondary | ICD-10-CM | POA: Insufficient documentation

## 2013-07-27 DIAGNOSIS — K8020 Calculus of gallbladder without cholecystitis without obstruction: Secondary | ICD-10-CM | POA: Insufficient documentation

## 2013-07-27 DIAGNOSIS — N10 Acute tubulo-interstitial nephritis: Secondary | ICD-10-CM | POA: Insufficient documentation

## 2013-07-27 MED ORDER — IOHEXOL 300 MG/ML  SOLN
100.0000 mL | Freq: Once | INTRAMUSCULAR | Status: AC | PRN
  Administered 2013-07-27: 100 mL via INTRAVENOUS

## 2013-07-30 ENCOUNTER — Other Ambulatory Visit: Payer: Self-pay | Admitting: Sports Medicine

## 2013-08-15 ENCOUNTER — Ambulatory Visit (INDEPENDENT_AMBULATORY_CARE_PROVIDER_SITE_OTHER): Payer: Medicare PPO | Admitting: Sports Medicine

## 2013-08-15 VITALS — BP 126/65 | HR 74 | Temp 98.1°F | Ht 69.0 in | Wt 302.0 lb

## 2013-08-15 DIAGNOSIS — R350 Frequency of micturition: Secondary | ICD-10-CM

## 2013-08-15 DIAGNOSIS — N10 Acute tubulo-interstitial nephritis: Secondary | ICD-10-CM

## 2013-08-15 LAB — POCT URINALYSIS DIPSTICK
Bilirubin, UA: NEGATIVE
Glucose, UA: NEGATIVE
Ketones, UA: NEGATIVE
Leukocytes, UA: NEGATIVE
Nitrite, UA: NEGATIVE
Protein, UA: NEGATIVE
Spec Grav, UA: 1.025
Urobilinogen, UA: 0.2
pH, UA: 6

## 2013-08-15 NOTE — Assessment & Plan Note (Addendum)
Overall doing extremely well now after 2 weeks of ciprofloxacin and an additional 2 weeks of nitrofurantoin with a culture that grew out pansensitive Escherichia coli. This did represent an acute, pyelonephritis, urine is negative, pain is resolved, he is cured. Return as needed.  50,000 colonies of enterococcus in the urine, considering his atypical presentation and recurrent history, even though he is asymptomatic I would like to treat with ampicillin.

## 2013-08-15 NOTE — Progress Notes (Signed)
  Subjective:    CC: Followup  HPI: Pyelonephritis: Now after 2 weeks of Cipro and 2 weeks of nitrofurantoin he is pain and symptom free.  Past medical history, Surgical history, Family history not pertinant except as noted below, Social history, Allergies, and medications have been entered into the medical record, reviewed, and no changes needed.   Review of Systems: No fevers, chills, night sweats, weight loss, chest pain, or shortness of breath.   Objective:    General: Well Developed, well nourished, and in no acute distress.  Neuro: Alert and oriented x3, extra-ocular muscles intact, sensation grossly intact.  HEENT: Normocephalic, atraumatic, pupils equal round reactive to light, neck supple, no masses, no lymphadenopathy, thyroid nonpalpable.  Skin: Warm and dry, no rashes. Cardiac: Regular rate and rhythm, no murmurs rubs or gallops, no lower extremity edema.  Respiratory: Clear to auscultation bilaterally. Not using accessory muscles, speaking in full sentences. Abdomen: Soft, nontender, nondistended, normal bowel sounds, no palpable masses, no costovertebral angle tenderness.  Urinalysis shows trace blood but otherwise negative.  Impression and Recommendations:

## 2013-08-15 NOTE — Progress Notes (Signed)
Pt come in today to have his urine rechecked and sent out for a culture. Pt denies and urine frequencies and pain./Steven Patton,CMA

## 2013-08-17 LAB — URINE CULTURE: Colony Count: 50000

## 2013-08-17 MED ORDER — AMPICILLIN 500 MG PO CAPS: 500.0000 mg | ORAL_CAPSULE | Freq: Four times a day (QID) | ORAL | Status: DC

## 2013-08-17 NOTE — Addendum Note (Signed)
Addended by: Monica BectonHEKKEKANDAM, Zayin Valadez J on: 08/17/2013 07:05 PM   Modules accepted: Orders, Medications

## 2013-09-18 ENCOUNTER — Other Ambulatory Visit: Payer: Self-pay | Admitting: Sports Medicine

## 2013-10-02 ENCOUNTER — Ambulatory Visit (INDEPENDENT_AMBULATORY_CARE_PROVIDER_SITE_OTHER): Payer: Medicare PPO | Admitting: Sports Medicine

## 2013-10-02 ENCOUNTER — Encounter: Payer: Self-pay | Admitting: Sports Medicine

## 2013-10-02 VITALS — BP 151/76 | HR 79 | Ht 69.0 in | Wt 300.0 lb

## 2013-10-02 DIAGNOSIS — I10 Essential (primary) hypertension: Secondary | ICD-10-CM

## 2013-10-02 MED ORDER — FUROSEMIDE 20 MG PO TABS: 20.0000 mg | ORAL_TABLET | Freq: Every day | ORAL | Status: DC

## 2013-10-02 NOTE — Progress Notes (Signed)
  Subjective:    CC: Followup  HPI:  Pyelonephritis: Resolved.  Hypertension: Currently taking his amlodipine, lisinopril/hydrochlorothiazide, and carvedilol but tells me he has not been taking his furosemide. Blood pressure is up and swelling is increased in both lower extremities.  Past medical history, Surgical history, Family history not pertinant except as noted below, Social history, Allergies, and medications have been entered into the medical record, reviewed, and no changes needed.   Review of Systems: No fevers, chills, night sweats, weight loss, chest pain, or shortness of breath.   Objective:    General: Well Developed, well nourished, and in no acute distress.  Neuro: Alert and oriented x3, extra-ocular muscles intact, sensation grossly intact.  HEENT: Normocephalic, atraumatic, pupils equal round reactive to light, neck supple, no masses, no lymphadenopathy, thyroid nonpalpable.  Skin: Warm and dry, no rashes. Cardiac: Regular rate and rhythm, no murmurs rubs or gallops, 3+ bilateral lower extremity pitting edema.  Respiratory: Clear to auscultation bilaterally. Not using accessory muscles, speaking in full sentences.  Impression and Recommendations:

## 2013-10-02 NOTE — Assessment & Plan Note (Signed)
Blood pressure is elevated, I discussed with his wife on the phone, he is taking carvedilol, lisinopril/hydrochlorothiazide, amlodipine, he is not taking his furosemide. This also explains is increasing lower extremity edema. We're going to refill furosemide 20 mg daily and recheck in one month.

## 2013-10-13 ENCOUNTER — Other Ambulatory Visit: Payer: Self-pay | Admitting: *Deleted

## 2013-10-13 DIAGNOSIS — I10 Essential (primary) hypertension: Secondary | ICD-10-CM

## 2013-10-13 MED ORDER — CARVEDILOL 25 MG PO TABS: 12.5000 mg | ORAL_TABLET | Freq: Two times a day (BID) | ORAL | Status: DC

## 2013-10-16 ENCOUNTER — Other Ambulatory Visit: Payer: Self-pay | Admitting: *Deleted

## 2013-10-16 DIAGNOSIS — I10 Essential (primary) hypertension: Secondary | ICD-10-CM

## 2013-10-16 MED ORDER — CARVEDILOL 25 MG PO TABS: 12.5000 mg | ORAL_TABLET | Freq: Two times a day (BID) | ORAL | Status: DC

## 2013-10-30 ENCOUNTER — Ambulatory Visit (INDEPENDENT_AMBULATORY_CARE_PROVIDER_SITE_OTHER): Payer: Medicare PPO | Admitting: Sports Medicine

## 2013-10-30 ENCOUNTER — Encounter: Payer: Self-pay | Admitting: Sports Medicine

## 2013-10-30 VITALS — BP 161/80 | HR 90 | Ht 69.0 in | Wt 303.0 lb

## 2013-10-30 DIAGNOSIS — I10 Essential (primary) hypertension: Secondary | ICD-10-CM

## 2013-10-30 DIAGNOSIS — R8290 Unspecified abnormal findings in urine: Secondary | ICD-10-CM

## 2013-10-30 DIAGNOSIS — R600 Localized edema: Secondary | ICD-10-CM

## 2013-10-30 DIAGNOSIS — R609 Edema, unspecified: Secondary | ICD-10-CM

## 2013-10-30 DIAGNOSIS — R82998 Other abnormal findings in urine: Secondary | ICD-10-CM

## 2013-10-30 LAB — POCT URINALYSIS DIPSTICK
Bilirubin, UA: NEGATIVE
Blood, UA: NEGATIVE
Glucose, UA: NEGATIVE
Ketones, UA: NEGATIVE
Leukocytes, UA: NEGATIVE
Nitrite, UA: NEGATIVE
Protein, UA: NEGATIVE
Spec Grav, UA: 1.015
Urobilinogen, UA: 0.2
pH, UA: 7.5

## 2013-10-30 MED ORDER — TORSEMIDE 20 MG PO TABS: 20.0000 mg | ORAL_TABLET | Freq: Every day | ORAL | Status: DC

## 2013-10-30 NOTE — Assessment & Plan Note (Signed)
Switching from furosemide to Demadex. Return in one week.

## 2013-10-30 NOTE — Assessment & Plan Note (Signed)
Persistently elevated blood pressure with persistent lower extremity edema, he is unclear as to whether he is taking furosemide. Switching to Demadex, return in one week.

## 2013-10-30 NOTE — Progress Notes (Signed)
  Subjective:    CC: Followup  HPI: Lower extremity swelling and hypertension: Steven Patton comes back, it is unclear whether he has been taking his furosemide which was prescribed at the last visit. He is taking his amlodipine, lisinopril/hydrochlorothiazide, and carvedilol appropriately. Weight has remained the same, swelling has not improved, blood pressure has not improved since last visit. He does also endorse some abnormal odor in his urine would like a urinalysis.  Past medical history, Surgical history, Family history not pertinant except as noted below, Social history, Allergies, and medications have been entered into the medical record, reviewed, and no changes needed.   Review of Systems: No fevers, chills, night sweats, weight loss, chest pain, or shortness of breath.   Objective:    General: Well Developed, well nourished, and in no acute distress.  Neuro: Alert and oriented x3, extra-ocular muscles intact, sensation grossly intact.  HEENT: Normocephalic, atraumatic, pupils equal round reactive to light, neck supple, no masses, no lymphadenopathy, thyroid nonpalpable.  Skin: Warm and dry, no rashes. Cardiac: Regular rate and rhythm, no murmurs rubs or gallops, 2-3+ pitting edema in both lower extremities despite compressive hose.Marland Kitchen  Respiratory: Clear to auscultation bilaterally. Not using accessory muscles, speaking in full sentences.  Urinalysis is completely negative.  Impression and Recommendations:

## 2013-11-06 ENCOUNTER — Ambulatory Visit (INDEPENDENT_AMBULATORY_CARE_PROVIDER_SITE_OTHER): Payer: Medicare PPO | Admitting: Sports Medicine

## 2013-11-06 ENCOUNTER — Encounter: Payer: Self-pay | Admitting: Sports Medicine

## 2013-11-06 VITALS — BP 128/71 | HR 72 | Ht 69.0 in | Wt 294.0 lb

## 2013-11-06 DIAGNOSIS — I10 Essential (primary) hypertension: Secondary | ICD-10-CM

## 2013-11-06 DIAGNOSIS — R600 Localized edema: Secondary | ICD-10-CM

## 2013-11-06 NOTE — Assessment & Plan Note (Signed)
Starting to improve, continue 20 mg of Demadex. Return in 3 months.

## 2013-11-06 NOTE — Progress Notes (Signed)
  Subjective:    CC: Followup  HPI: Hypertension: Well controlled.  Lower extremity edema: Improved significantly with a significant drop in weight after switching from furosemide to torsemide. He has lost a significant amount of weight since the last visit.  Past medical history, Surgical history, Family history not pertinant except as noted below, Social history, Allergies, and medications have been entered into the medical record, reviewed, and no changes needed.   Review of Systems: No fevers, chills, night sweats, weight loss, chest pain, or shortness of breath.   Objective:    General: Well Developed, well nourished, and in no acute distress.  Neuro: Alert and oriented x3, extra-ocular muscles intact, sensation grossly intact.  HEENT: Normocephalic, atraumatic, pupils equal round reactive to light, neck supple, no masses, no lymphadenopathy, thyroid nonpalpable.  Skin: Warm and dry, no rashes. Cardiac: Regular rate and rhythm, no murmurs rubs or gallops, 2+ lower extremity edema, significantly improved from before.Marland Kitchen.  Respiratory: Clear to auscultation bilaterally. Not using accessory muscles, speaking in full sentences.  Impression and Recommendations:

## 2013-11-06 NOTE — Assessment & Plan Note (Signed)
Improved significantly with switching from furosemide to torsemide.

## 2014-02-05 ENCOUNTER — Ambulatory Visit: Payer: Medicare PPO | Admitting: Sports Medicine

## 2014-02-08 ENCOUNTER — Encounter: Payer: Self-pay | Admitting: Sports Medicine

## 2014-02-08 ENCOUNTER — Ambulatory Visit (INDEPENDENT_AMBULATORY_CARE_PROVIDER_SITE_OTHER): Payer: Medicare PPO | Admitting: Sports Medicine

## 2014-02-08 VITALS — BP 114/66 | HR 70 | Ht 69.0 in | Wt 300.0 lb

## 2014-02-08 DIAGNOSIS — R600 Localized edema: Secondary | ICD-10-CM

## 2014-02-08 DIAGNOSIS — I10 Essential (primary) hypertension: Secondary | ICD-10-CM

## 2014-02-08 DIAGNOSIS — E669 Obesity, unspecified: Secondary | ICD-10-CM

## 2014-02-08 MED ORDER — TORSEMIDE 20 MG PO TABS: 20.0000 mg | ORAL_TABLET | Freq: Every day | ORAL | Status: DC

## 2014-02-08 MED ORDER — PHENTERMINE HCL 37.5 MG PO CAPS: 37.5000 mg | ORAL_CAPSULE | ORAL | Status: DC

## 2014-02-08 NOTE — Assessment & Plan Note (Addendum)
Has not been exercising often as his wife is been sick. Lower extremity edema has worsened. He did have a fantastic improvement after switching from furosemide to torsemide. Refilling torsemide, rechecking renal function and potassium.  If hypokalemic we will add Aldactone.  Very mildly hypokalemic, adding spironolactone, we will continue to keep an eye on renal function.

## 2014-02-08 NOTE — Assessment & Plan Note (Signed)
Has been off of phentermine for sometime now. 2 years. We will restart, and he will return monthly for weight checks and refills.

## 2014-02-08 NOTE — Assessment & Plan Note (Signed)
Well-controlled, return as needed for this. 

## 2014-02-08 NOTE — Progress Notes (Signed)
  Subjective:    CC: follow-up  HPI: Hypertension: Well controlled.  Obesity: Has been off of phentermine for over a year, would like to restart.  Lower extremity edema: Good response to Demadex.  Past medical history, Surgical history, Family history not pertinant except as noted below, Social history, Allergies, and medications have been entered into the medical record, reviewed, and no changes needed.   Review of Systems: No fevers, chills, night sweats, weight loss, chest pain, or shortness of breath.   Objective:    General: Well Developed, well nourished, and in no acute distress.  Neuro: Alert and oriented x3, extra-ocular muscles intact, sensation grossly intact.  HEENT: Normocephalic, atraumatic, pupils equal round reactive to light, neck supple, no masses, no lymphadenopathy, thyroid nonpalpable.  Skin: Warm and dry, no rashes. Cardiac: Regular rate and rhythm, no murmurs rubs or gallops, 2+ symmetric lower extremity edema.  Respiratory: Clear to auscultation bilaterally. Not using accessory muscles, speaking in full sentences.  Impression and Recommendations:

## 2014-02-09 LAB — BASIC METABOLIC PANEL
BUN: 22 mg/dL (ref 6–23)
Calcium: 9.5 mg/dL (ref 8.4–10.5)
Chloride: 100 mEq/L (ref 96–112)
Glucose, Bld: 95 mg/dL (ref 70–99)
Potassium: 3.4 mEq/L — ABNORMAL LOW (ref 3.5–5.3)
Sodium: 141 mEq/L (ref 135–145)

## 2014-02-09 LAB — BASIC METABOLIC PANEL WITH GFR
CO2: 29 meq/L (ref 19–32)
Creat: 1.53 mg/dL — ABNORMAL HIGH (ref 0.50–1.35)

## 2014-02-09 MED ORDER — SPIRONOLACTONE 25 MG PO TABS: 25.0000 mg | ORAL_TABLET | Freq: Every day | ORAL | Status: DC

## 2014-02-09 NOTE — Addendum Note (Signed)
Addended by: Monica BectonHEKKEKANDAM, Veryl Winemiller J on: 02/09/2014 08:29 AM   Modules accepted: Orders

## 2014-02-26 ENCOUNTER — Other Ambulatory Visit: Payer: Self-pay | Admitting: Sports Medicine

## 2014-03-08 ENCOUNTER — Encounter: Payer: Self-pay | Admitting: Sports Medicine

## 2014-03-08 ENCOUNTER — Other Ambulatory Visit: Payer: Self-pay | Admitting: Sports Medicine

## 2014-03-08 ENCOUNTER — Ambulatory Visit (INDEPENDENT_AMBULATORY_CARE_PROVIDER_SITE_OTHER): Payer: Medicare PPO | Admitting: Sports Medicine

## 2014-03-08 VITALS — BP 128/73 | HR 95 | Wt 292.0 lb

## 2014-03-08 DIAGNOSIS — E669 Obesity, unspecified: Secondary | ICD-10-CM

## 2014-03-08 DIAGNOSIS — R600 Localized edema: Secondary | ICD-10-CM

## 2014-03-08 MED ORDER — PHENTERMINE HCL 37.5 MG PO TABS: 18.7500 mg | ORAL_TABLET | Freq: Two times a day (BID) | ORAL | Status: DC

## 2014-03-08 NOTE — Assessment & Plan Note (Signed)
Continue torsemide, rechecking potassium, we did add Aldactone.

## 2014-03-08 NOTE — Assessment & Plan Note (Signed)
Good continued weight loss, switching to phentermine one half tab twice a day as he is having some obstructive uropathy. Return in one month for a weight check and refills. We will be starting his second month

## 2014-03-08 NOTE — Progress Notes (Signed)
  Subjective:    CC: Weight check  HPI: Obesity: Good weight loss, a little bit of obstructive uropathy, amenable to switch to one half tab twice a day  Lower extremity edema: Well controlled with torsemide and spironolactone, due for recheck of hypokalemia.  Past medical history, Surgical history, Family history not pertinant except as noted below, Social history, Allergies, and medications have been entered into the medical record, reviewed, and no changes needed.   Review of Systems: No fevers, chills, night sweats, weight loss, chest pain, or shortness of breath.   Objective:    General: Well Developed, well nourished, and in no acute distress.  Neuro: Alert and oriented x3, extra-ocular muscles intact, sensation grossly intact.  HEENT: Normocephalic, atraumatic, pupils equal round reactive to light, neck supple, no masses, no lymphadenopathy, thyroid nonpalpable.  Skin: Warm and dry, no rashes. Cardiac: Regular rate and rhythm, no murmurs rubs or gallops, trace lower extremity edema.  Respiratory: Clear to auscultation bilaterally. Not using accessory muscles, speaking in full sentences.  Impression and Recommendations:

## 2014-03-09 LAB — BASIC METABOLIC PANEL
BUN: 29 mg/dL — ABNORMAL HIGH (ref 6–23)
CO2: 24 mEq/L (ref 19–32)
Chloride: 101 mEq/L (ref 96–112)
Creat: 1.82 mg/dL — ABNORMAL HIGH (ref 0.50–1.35)
Potassium: 4.4 mEq/L (ref 3.5–5.3)
Sodium: 138 mEq/L (ref 135–145)

## 2014-03-09 LAB — BASIC METABOLIC PANEL WITH GFR
Calcium: 9.9 mg/dL (ref 8.4–10.5)
Glucose, Bld: 110 mg/dL — ABNORMAL HIGH (ref 70–99)

## 2014-04-05 ENCOUNTER — Ambulatory Visit (INDEPENDENT_AMBULATORY_CARE_PROVIDER_SITE_OTHER): Payer: Medicare PPO | Admitting: Sports Medicine

## 2014-04-05 ENCOUNTER — Encounter: Payer: Self-pay | Admitting: Sports Medicine

## 2014-04-05 VITALS — BP 97/59 | HR 77 | Ht 69.0 in | Wt 290.0 lb

## 2014-04-05 DIAGNOSIS — E785 Hyperlipidemia, unspecified: Secondary | ICD-10-CM

## 2014-04-05 DIAGNOSIS — E669 Obesity, unspecified: Secondary | ICD-10-CM

## 2014-04-05 DIAGNOSIS — I10 Essential (primary) hypertension: Secondary | ICD-10-CM

## 2014-04-05 DIAGNOSIS — R600 Localized edema: Secondary | ICD-10-CM

## 2014-04-05 DIAGNOSIS — Z23 Encounter for immunization: Secondary | ICD-10-CM

## 2014-04-05 LAB — COMPREHENSIVE METABOLIC PANEL WITH GFR
BUN: 43 mg/dL — ABNORMAL HIGH (ref 6–23)
CO2: 27 meq/L (ref 19–32)
Calcium: 9.8 mg/dL (ref 8.4–10.5)
Potassium: 5.2 meq/L (ref 3.5–5.3)
Sodium: 139 meq/L (ref 135–145)
Total Protein: 6.9 g/dL (ref 6.0–8.3)

## 2014-04-05 LAB — COMPREHENSIVE METABOLIC PANEL
ALT: 19 U/L (ref 0–53)
AST: 17 U/L (ref 0–37)
Albumin: 4.5 g/dL (ref 3.5–5.2)
Alkaline Phosphatase: 38 U/L — ABNORMAL LOW (ref 39–117)
Chloride: 99 mEq/L (ref 96–112)
Creat: 2.4 mg/dL — ABNORMAL HIGH (ref 0.50–1.35)
Glucose, Bld: 115 mg/dL — ABNORMAL HIGH (ref 70–99)
Total Bilirubin: 0.5 mg/dL (ref 0.2–1.2)

## 2014-04-05 LAB — CBC
HCT: 46.3 % (ref 39.0–52.0)
Hemoglobin: 15.9 g/dL (ref 13.0–17.0)
MCH: 31.2 pg (ref 26.0–34.0)
MCHC: 34.3 g/dL (ref 30.0–36.0)
MCV: 91 fL (ref 78.0–100.0)
MPV: 10.1 fL (ref 8.6–12.4)
Platelets: 148 10*3/uL — ABNORMAL LOW (ref 150–400)
RBC: 5.09 MIL/uL (ref 4.22–5.81)
RDW: 13.7 % (ref 11.5–15.5)
WBC: 10.5 10*3/uL (ref 4.0–10.5)

## 2014-04-05 LAB — LIPID PANEL
Cholesterol: 180 mg/dL (ref 0–200)
HDL: 36 mg/dL — ABNORMAL LOW (ref >=40)
LDL Cholesterol: 109 mg/dL — ABNORMAL HIGH (ref 0–99)
Total CHOL/HDL Ratio: 5 Ratio
Triglycerides: 177 mg/dL — ABNORMAL HIGH (ref <150)
VLDL: 35 mg/dL (ref 0–40)

## 2014-04-05 MED ORDER — PHENTERMINE HCL 37.5 MG PO TABS: 18.7500 mg | ORAL_TABLET | Freq: Two times a day (BID) | ORAL | Status: DC

## 2014-04-05 NOTE — Assessment & Plan Note (Signed)
Well controlled, no changes 

## 2014-04-05 NOTE — Assessment & Plan Note (Signed)
Rechecking lipids. 

## 2014-04-05 NOTE — Progress Notes (Signed)
  Subjective:    CC: Follow-up  HPI: Obesity: 2 pound weight loss, entering the third month of phentermine.  Hypertension: Well controlled.  Lower extremity edema: Controlled well with torsemide and Spironolactone.  Past medical history, Surgical history, Family history not pertinant except as noted below, Social history, Allergies, and medications have been entered into the medical record, reviewed, and no changes needed.   Review of Systems: No fevers, chills, night sweats, weight loss, chest pain, or shortness of breath.   Objective:    General: Well Developed, well nourished, and in no acute distress.  Neuro: Alert and oriented x3, extra-ocular muscles intact, sensation grossly intact.  HEENT: Normocephalic, atraumatic, pupils equal round reactive to light, neck supple, no masses, no lymphadenopathy, thyroid nonpalpable.  Skin: Warm and dry, no rashes. Cardiac: Regular rate and rhythm, no murmurs rubs or gallops, no lower extremity edema.  Respiratory: Clear to auscultation bilaterally. Not using accessory muscles, speaking in full sentences.  Impression and Recommendations:

## 2014-04-05 NOTE — Assessment & Plan Note (Signed)
2 pound weight loss. Continue phentermine. Again cautioned regarding his diet.

## 2014-04-06 ENCOUNTER — Other Ambulatory Visit: Payer: Self-pay

## 2014-04-06 DIAGNOSIS — R600 Localized edema: Secondary | ICD-10-CM

## 2014-04-06 NOTE — Addendum Note (Signed)
Addended by: Monica BectonHEKKEKANDAM, THOMAS J on: 04/06/2014 11:02 AM   Modules accepted: Orders

## 2014-04-06 NOTE — Assessment & Plan Note (Signed)
There is now likely a bit of iatrogenic prerenal azotemia. Decreasing Demadex to one half tab daily, patient will hydrate, and we can recheck a metabolic panel in 2 weeks. Potassium was normal.

## 2014-04-18 ENCOUNTER — Other Ambulatory Visit: Payer: Self-pay | Admitting: Sports Medicine

## 2014-04-19 LAB — BASIC METABOLIC PANEL WITH GFR
Chloride: 101 meq/L (ref 96–112)
Glucose, Bld: 100 mg/dL — ABNORMAL HIGH (ref 70–99)
Potassium: 5 meq/L (ref 3.5–5.3)

## 2014-04-19 LAB — BASIC METABOLIC PANEL
BUN: 25 mg/dL — ABNORMAL HIGH (ref 6–23)
CO2: 25 mEq/L (ref 19–32)
Calcium: 9.3 mg/dL (ref 8.4–10.5)
Creat: 1.73 mg/dL — ABNORMAL HIGH (ref 0.50–1.35)
Sodium: 133 mEq/L — ABNORMAL LOW (ref 135–145)

## 2014-04-26 ENCOUNTER — Other Ambulatory Visit: Payer: Self-pay | Admitting: Sports Medicine

## 2014-05-03 ENCOUNTER — Ambulatory Visit: Payer: Medicare PPO | Admitting: Sports Medicine

## 2014-06-27 ENCOUNTER — Other Ambulatory Visit: Payer: Self-pay | Admitting: Sports Medicine

## 2014-07-26 ENCOUNTER — Other Ambulatory Visit: Payer: Self-pay | Admitting: Sports Medicine

## 2014-08-02 ENCOUNTER — Ambulatory Visit (INDEPENDENT_AMBULATORY_CARE_PROVIDER_SITE_OTHER): Payer: Medicare PPO | Admitting: Sports Medicine

## 2014-08-02 ENCOUNTER — Encounter: Payer: Self-pay | Admitting: Sports Medicine

## 2014-08-02 VITALS — BP 118/58 | HR 60 | Wt 310.0 lb

## 2014-08-02 DIAGNOSIS — L600 Ingrowing nail: Secondary | ICD-10-CM | POA: Diagnosis not present

## 2014-08-02 MED ORDER — HYDROCODONE-ACETAMINOPHEN 5-325 MG PO TABS: 1.0000 | ORAL_TABLET | Freq: Three times a day (TID) | ORAL | Status: DC | PRN

## 2014-08-02 NOTE — Patient Instructions (Signed)
Toenail Removal Toenails may need to be removed because of injury, infections, or to correct abnormal growth. A special non-stick bandage will likely be put tightly on your toe to prevent bleeding. Often times a new nail will grow back. Sometimes the new nail may be deformed. Most of the time when a nail is lost, it will gradually heal, but may be sensitive for a long time. HOME CARE INSTRUCTIONS   Keep your foot elevated to relieve pain and swelling. This will require lying in bed or on a couch with the leg on pillows or sitting in a recliner with the leg up. Walking or letting your leg dangle may increase swelling, slow healing, and cause throbbing pain.  Keep your bandage dry and clean.  Change your bandage in 24 hours.  After your bandage is changed, soak your foot in warm, soapy water for 10 to 20 minutes. Do this 3 times per day. This helps reduce pain and swelling. After soaking your foot apply a clean, dry bandage. Change your bandage if it is wet or dirty.  Only take over-the-counter or prescription medicines for pain, discomfort, or fever as directed by your caregiver.  See your caregiver as needed for problems. You might need a tetanus shot now if:  You have no idea when you had the last one.  You have never had a tetanus shot before.  The injured area had dirt in it. If you need a tetanus shot, and you decide not to get one, there is a rare chance of getting tetanus. Sickness from tetanus can be serious. If you did get a tetanus shot, your arm may swell, get red and warm to the touch at the shot site. This is common and not a problem. SEEK IMMEDIATE MEDICAL CARE IF:   You have increased pain, swelling, redness, warmth, drainage, or bleeding.  You have a fever.  You have swelling that spreads from your toe into your foot. Document Released: 10/18/2002 Document Revised: 04/13/2011 Document Reviewed: 01/30/2008 Exit[employer] Patient Information 2015 [employer]. This  information is not intended to replace advice given to you by your health care provider. Make sure you discuss any questions you have with your health care provider. 

## 2014-08-02 NOTE — Progress Notes (Signed)
  Subjective:    CC: ingrown toenail  HPI: For the past several weeks this pleasant 79 year old male has had significant pain regarding his right great toenail, medial nail fold, it is ingrown, swollen, painful. Severe, persistent without radiation.  Past medical history, Surgical history, Family history not pertinant except as noted below, Social history, Allergies, and medications have been entered into the medical record, reviewed, and no changes needed.   Review of Systems: No fevers, chills, night sweats, weight loss, chest pain, or shortness of breath.   Objective:    General: Well Developed, well nourished, and in no acute distress.  Neuro: Alert and oriented x3, extra-ocular muscles intact, sensation grossly intact.  HEENT: Normocephalic, atraumatic, pupils equal round reactive to light, neck supple, no masses, no lymphadenopathy, thyroid nonpalpable.  Skin: Warm and dry, no rashes. Cardiac: Regular rate and rhythm, no murmurs rubs or gallops, no lower extremity edema.  Respiratory: Clear to auscultation bilaterally. Not using accessory muscles, speaking in full sentences. Right foot: Ingrown right medial nail fold, minimally infected, there is also diffuse onychodystrophy  Procedure:  Removal of right great toenail. Risks, benefits, alternatives explained to patient. Consent obtained. Time out conducted. Noted no overlying induration or erythema at site of injection. Toe cleaned with alcohol, then a total of **cc lidocaine 2% infiltrated at adjacent webspaces at the location of the bifurcation of the common digital nerve to proper digital nerves.  Some lidocaine also infiltrated at hyponychium and under nail bed.  Adequate anesthesia ensured. Toe prepped and draped in a sterile fashion. Nail elevator used to separate nail plate from nail bed. Hemostat then used to separate nail fragment from surrounding structures. Nail bed and matrix treated. Minor bleeding controlled with  pressure and phenol. Antibiotic ointment applied. Toe dressed. Advised to return if increased redness, swelling, drainage, fevers, or chills.  Impression and Recommendations:

## 2014-08-02 NOTE — Assessment & Plan Note (Signed)
Remove the entire great toenail, treated with phenol. Return in one week for a wound check. Hydrocodone for pain.

## 2014-08-03 ENCOUNTER — Other Ambulatory Visit: Payer: Self-pay

## 2014-08-03 DIAGNOSIS — R600 Localized edema: Secondary | ICD-10-CM

## 2014-08-03 MED ORDER — TORSEMIDE 20 MG PO TABS: 10.0000 mg | ORAL_TABLET | Freq: Every day | ORAL | Status: DC

## 2014-08-10 ENCOUNTER — Ambulatory Visit (INDEPENDENT_AMBULATORY_CARE_PROVIDER_SITE_OTHER): Payer: Medicare PPO | Admitting: Sports Medicine

## 2014-08-10 ENCOUNTER — Encounter: Payer: Self-pay | Admitting: Sports Medicine

## 2014-08-10 VITALS — BP 121/56 | HR 66 | Ht 69.0 in | Wt 305.0 lb

## 2014-08-10 DIAGNOSIS — Z23 Encounter for immunization: Secondary | ICD-10-CM

## 2014-08-10 DIAGNOSIS — L600 Ingrowing nail: Secondary | ICD-10-CM | POA: Diagnosis not present

## 2014-08-10 NOTE — Assessment & Plan Note (Signed)
Doing well, return as needed 

## 2014-08-10 NOTE — Addendum Note (Signed)
Addended by: Pixie CasinoUNNINGHAM, Tyric Rodeheaver C on: 08/10/2014 04:52 PM   Modules accepted: Orders

## 2014-08-10 NOTE — Progress Notes (Signed)
  Subjective: 1 week post excision of the right great toenail with phenol, doing well.  Objective: General: Well-developed, well-nourished, and in no acute distress. Right foot: Doing well post excision of the right great toenail, no signs of infection, clean, dry, intact, good visible granulation tissue.  Assessment/plan:

## 2014-09-01 ENCOUNTER — Other Ambulatory Visit: Payer: Self-pay | Admitting: Sports Medicine

## 2014-11-15 ENCOUNTER — Other Ambulatory Visit: Payer: Self-pay | Admitting: Sports Medicine

## 2014-11-15 DIAGNOSIS — R600 Localized edema: Secondary | ICD-10-CM

## 2014-11-15 MED ORDER — SPIRONOLACTONE 25 MG PO TABS: 25.0000 mg | ORAL_TABLET | Freq: Every day | ORAL | Status: DC

## 2014-11-20 ENCOUNTER — Ambulatory Visit (INDEPENDENT_AMBULATORY_CARE_PROVIDER_SITE_OTHER): Payer: Medicare PPO | Admitting: Sports Medicine

## 2014-11-20 ENCOUNTER — Encounter: Payer: Self-pay | Admitting: Sports Medicine

## 2014-11-20 DIAGNOSIS — E669 Obesity, unspecified: Secondary | ICD-10-CM | POA: Diagnosis not present

## 2014-11-20 MED ORDER — PHENTERMINE HCL 37.5 MG PO TABS: ORAL_TABLET | ORAL | Status: DC

## 2014-11-20 NOTE — Progress Notes (Signed)
  Subjective:    CC: Follow-up  HPI: Obesity: Desires to restart weight loss medication  Past medical history, Surgical history, Family history not pertinant except as noted below, Social history, Allergies, and medications have been entered into the medical record, reviewed, and no changes needed.   Review of Systems: No fevers, chills, night sweats, weight loss, chest pain, or shortness of breath.   Objective:    General: Well Developed, well nourished, and in no acute distress.  Neuro: Alert and oriented x3, extra-ocular muscles intact, sensation grossly intact.  HEENT: Normocephalic, atraumatic, pupils equal round reactive to light, neck supple, no masses, no lymphadenopathy, thyroid nonpalpable.  Skin: Warm and dry, no rashes. Cardiac: Regular rate and rhythm, no murmurs rubs or gallops, no lower extremity edema.  Respiratory: Clear to auscultation bilaterally. Not using accessory muscles, speaking in full sentences.  Impression and Recommendations:    I spent 25 minutes with this patient, greater than 50% was face-to-face time counseling regarding the above diagnoses

## 2014-11-20 NOTE — Assessment & Plan Note (Signed)
Restarting phentermine, return monthly for weight checks and refills. 

## 2014-12-12 ENCOUNTER — Other Ambulatory Visit: Payer: Self-pay | Admitting: Sports Medicine

## 2014-12-18 ENCOUNTER — Ambulatory Visit (INDEPENDENT_AMBULATORY_CARE_PROVIDER_SITE_OTHER): Payer: Medicare PPO | Admitting: Sports Medicine

## 2014-12-18 ENCOUNTER — Encounter: Payer: Self-pay | Admitting: Sports Medicine

## 2014-12-18 VITALS — BP 109/58 | HR 98 | Temp 98.0°F | Resp 18 | Wt 316.2 lb

## 2014-12-18 DIAGNOSIS — E669 Obesity, unspecified: Secondary | ICD-10-CM

## 2014-12-18 DIAGNOSIS — R600 Localized edema: Secondary | ICD-10-CM

## 2014-12-18 MED ORDER — PHENTERMINE HCL 37.5 MG PO TABS: ORAL_TABLET | ORAL | Status: DC

## 2014-12-18 MED ORDER — TORSEMIDE 20 MG PO TABS: 20.0000 mg | ORAL_TABLET | Freq: Every day | ORAL | Status: DC

## 2014-12-18 NOTE — Progress Notes (Signed)
  Subjective:    CC: Follow-up  HPI: Obesity: 6 pound weight loss since the last visit, he has finished a month of phentermine.  Lower extremity edema: Only doing a half tab of Demadex.  Past medical history, Surgical history, Family history not pertinant except as noted below, Social history, Allergies, and medications have been entered into the medical record, reviewed, and no changes needed.   Review of Systems: No fevers, chills, night sweats, weight loss, chest pain, or shortness of breath.   Objective:    General: Well Developed, well nourished, and in no acute distress.  Neuro: Alert and oriented x3, extra-ocular muscles intact, sensation grossly intact.  HEENT: Normocephalic, atraumatic, pupils equal round reactive to light, neck supple, no masses, no lymphadenopathy, thyroid nonpalpable.  Skin: Warm and dry, no rashes. Cardiac: Regular rate and rhythm, no murmurs rubs or gallops, 2+ lower extremity edema.  Respiratory: Clear to auscultation bilaterally. Not using accessory muscles, speaking in full sentences.  Impression and Recommendations:

## 2014-12-18 NOTE — Assessment & Plan Note (Signed)
Still with significant edema, increasing Demadex to 1 tab daily.

## 2014-12-18 NOTE — Assessment & Plan Note (Signed)
6 pound weight loss on phentermine, refilling medication, return in one month for a weight check

## 2015-01-15 ENCOUNTER — Ambulatory Visit (INDEPENDENT_AMBULATORY_CARE_PROVIDER_SITE_OTHER): Payer: Medicare PPO | Admitting: Sports Medicine

## 2015-01-15 ENCOUNTER — Encounter: Payer: Self-pay | Admitting: Sports Medicine

## 2015-01-15 VITALS — BP 120/73 | HR 64 | Temp 97.6°F | Resp 18 | Wt 318.3 lb

## 2015-01-15 DIAGNOSIS — Z299 Encounter for prophylactic measures, unspecified: Secondary | ICD-10-CM | POA: Diagnosis not present

## 2015-01-15 DIAGNOSIS — E669 Obesity, unspecified: Secondary | ICD-10-CM | POA: Diagnosis not present

## 2015-01-15 DIAGNOSIS — E119 Type 2 diabetes mellitus without complications: Secondary | ICD-10-CM | POA: Diagnosis not present

## 2015-01-15 MED ORDER — DULAGLUTIDE 0.75 MG/0.5ML ~~LOC~~ SOAJ: 0.7500 mg | SUBCUTANEOUS | Status: DC

## 2015-01-15 NOTE — Progress Notes (Signed)
  Subjective:    CC: Follow-up  HPI: Obesity: No weight loss on phentermine, agreeable to try GLP-1 agonist. He does have hyperglycemia with an elevated A1c.  Past medical history, Surgical history, Family history not pertinant except as noted below, Social history, Allergies, and medications have been entered into the medical record, reviewed, and no changes needed.   Review of Systems: No fevers, chills, night sweats, weight loss, chest pain, or shortness of breath.   Objective:    General: Well Developed, well nourished, and in no acute distress.  Neuro: Alert and oriented x3, extra-ocular muscles intact, sensation grossly intact.  HEENT: Normocephalic, atraumatic, pupils equal round reactive to light, neck supple, no masses, no lymphadenopathy, thyroid nonpalpable.  Skin: Warm and dry, no rashes. Cardiac: Regular rate and rhythm, no murmurs rubs or gallops, no lower extremity edema.  Respiratory: Clear to auscultation bilaterally. Not using accessory muscles, speaking in full sentences.  Impression and Recommendations:

## 2015-01-15 NOTE — Assessment & Plan Note (Signed)
No weight loss with phentermine, switching to a GLP-1 agonist. Return for a nurse visit to learn how to do injection.

## 2015-01-15 NOTE — Progress Notes (Signed)
   Subjective:    Patient ID: Steven Patton, male    DOB: 04/08/1935, 79 y.o.   MRN: [id number]  HPI  Patient here for trulicity injection  Review of Systems     Objective:   Physical Exam        Assessment & Plan:   Showed patient how to give injection. He tolerated the procedure well.  Gave handout with step by step instructions. Made aware to call if any questions

## 2015-02-12 ENCOUNTER — Ambulatory Visit: Payer: Medicare PPO | Admitting: Sports Medicine

## 2015-03-04 ENCOUNTER — Inpatient Hospital Stay: Payer: Medicare PPO | Admitting: Sports Medicine

## 2015-03-04 ENCOUNTER — Encounter: Payer: Self-pay | Admitting: Sports Medicine

## 2015-03-04 ENCOUNTER — Ambulatory Visit (INDEPENDENT_AMBULATORY_CARE_PROVIDER_SITE_OTHER): Payer: Medicare PPO | Admitting: Sports Medicine

## 2015-03-04 VITALS — BP 123/66 | HR 79 | Resp 18 | Wt 307.8 lb

## 2015-03-04 DIAGNOSIS — R6521 Severe sepsis with septic shock: Secondary | ICD-10-CM | POA: Diagnosis not present

## 2015-03-04 DIAGNOSIS — Z23 Encounter for immunization: Secondary | ICD-10-CM

## 2015-03-04 DIAGNOSIS — E669 Obesity, unspecified: Secondary | ICD-10-CM

## 2015-03-04 DIAGNOSIS — A4151 Sepsis due to Escherichia coli [E. coli]: Secondary | ICD-10-CM | POA: Diagnosis not present

## 2015-03-04 NOTE — Assessment & Plan Note (Signed)
Discharged from [facility], Escherichia coli bacteremia with pyuria and blood in urine. He did have an element of acute kidney injury with a creatinine in the force. Rechecking urine culture, urinalysis, blood culture, renal function. Continue antibiotics. I do expect his blood pressures a backup over time necessitating restarting all of his blood pressure medications.

## 2015-03-04 NOTE — Progress Notes (Signed)
  Subjective:    CC: Hospital follow-up  HPI: This is a pleasant 80 year old male, he was recently hospitalized for 5 days last week for septic shock.  Ultimately blood cultures grew out Escherichia coli, and urinalysis was positive for leukocytes and blood suggesting a urosepsis. He was treated with antibiotics, and recovered quickly. He did express acute kidney injury with a creatinine up to 4 in the hospital that was trending downward by discharge. Otherwise he's feeling okay, asymptomatic today.  Past medical history, Surgical history, Family history not pertinant except as noted below, Social history, Allergies, and medications have been entered into the medical record, reviewed, and no changes needed.   Review of Systems: No fevers, chills, night sweats, weight loss, chest pain, or shortness of breath.   Objective:    General: Well Developed, well nourished, and in no acute distress.  Neuro: Alert and oriented x3, extra-ocular muscles intact, sensation grossly intact.  HEENT: Normocephalic, atraumatic, pupils equal round reactive to light, neck supple, no masses, no lymphadenopathy, thyroid nonpalpable.  Skin: Warm and dry, no rashes. Cardiac: Regular rate and rhythm, no murmurs rubs or gallops, no lower extremity edema.  Respiratory: Clear to auscultation bilaterally. Not using accessory muscles, speaking in full sentences.  Impression and Recommendations:    I spent 25 minutes with this patient, greater than 50% was face-to-face time counseling regarding the above diagnoses

## 2015-03-05 LAB — CBC WITH DIFFERENTIAL/PLATELET
Basophils Absolute: 0.1 K/uL (ref 0.0–0.1)
Basophils Relative: 1 % (ref 0–1)
Eosinophils Absolute: 0.4 K/uL (ref 0.0–0.7)
Eosinophils Relative: 4 % (ref 0–5)
HCT: 37.1 % — ABNORMAL LOW (ref 39.0–52.0)
Hemoglobin: 12.6 g/dL — ABNORMAL LOW (ref 13.0–17.0)
Lymphocytes Relative: 20 % (ref 12–46)
Lymphs Abs: 1.8 K/uL (ref 0.7–4.0)
MCH: 30 pg (ref 26.0–34.0)
MCHC: 34 g/dL (ref 30.0–36.0)
MCV: 88.3 fL (ref 78.0–100.0)
MPV: 8.7 fL (ref 8.6–12.4)
Monocytes Absolute: 1.3 10*3/uL — ABNORMAL HIGH (ref 0.1–1.0)
Monocytes Relative: 14 % — ABNORMAL HIGH (ref 3–12)
Neutro Abs: 5.5 10*3/uL (ref 1.7–7.7)
Neutrophils Relative %: 61 % (ref 43–77)
Platelets: 172 K/uL (ref 150–400)
RBC: 4.2 MIL/uL — ABNORMAL LOW (ref 4.22–5.81)
RDW: 13.2 % (ref 11.5–15.5)
WBC: 9 10*3/uL (ref 4.0–10.5)

## 2015-03-05 LAB — HEMOGLOBIN A1C
Hgb A1c MFr Bld: 5.8 % — ABNORMAL HIGH (ref <5.7)
Mean Plasma Glucose: 120 mg/dL — ABNORMAL HIGH (ref <117)

## 2015-03-05 LAB — COMPREHENSIVE METABOLIC PANEL WITH GFR
BUN: 22 mg/dL (ref 7–25)
Chloride: 103 mmol/L (ref 98–110)
Creat: 1.89 mg/dL — ABNORMAL HIGH (ref 0.70–1.18)
Glucose, Bld: 106 mg/dL — ABNORMAL HIGH (ref 65–99)
Potassium: 4.4 mmol/L (ref 3.5–5.3)
Sodium: 138 mmol/L (ref 135–146)
Total Protein: 6.1 g/dL (ref 6.1–8.1)

## 2015-03-05 LAB — URINALYSIS
Bilirubin Urine: NEGATIVE
Glucose, UA: NEGATIVE
Hgb urine dipstick: NEGATIVE
Ketones, ur: NEGATIVE
Leukocytes, UA: NEGATIVE
Nitrite: NEGATIVE
Protein, ur: NEGATIVE
Specific Gravity, Urine: 1.016 (ref 1.001–1.035)
pH: 5 (ref 5.0–8.0)

## 2015-03-05 LAB — COMPREHENSIVE METABOLIC PANEL
ALT: 17 U/L (ref 9–46)
AST: 18 U/L (ref 10–35)
Albumin: 3.7 g/dL (ref 3.6–5.1)
Alkaline Phosphatase: 32 U/L — ABNORMAL LOW (ref 40–115)
CO2: 21 mmol/L (ref 20–31)
Calcium: 8.6 mg/dL (ref 8.6–10.3)
Total Bilirubin: 0.4 mg/dL (ref 0.2–1.2)

## 2015-03-06 LAB — URINE CULTURE
Colony Count: NO GROWTH
Culture: NO GROWTH
Organism ID, Bacteria: NO GROWTH

## 2015-03-11 LAB — CULTURE, BLOOD (SINGLE): Organism ID, Bacteria: NO GROWTH

## 2015-03-22 ENCOUNTER — Other Ambulatory Visit: Payer: Self-pay | Admitting: Sports Medicine

## 2015-04-08 ENCOUNTER — Ambulatory Visit (INDEPENDENT_AMBULATORY_CARE_PROVIDER_SITE_OTHER): Payer: Medicare PPO | Admitting: Sports Medicine

## 2015-04-08 ENCOUNTER — Encounter: Payer: Self-pay | Admitting: Sports Medicine

## 2015-04-08 DIAGNOSIS — E669 Obesity, unspecified: Secondary | ICD-10-CM | POA: Diagnosis not present

## 2015-04-08 DIAGNOSIS — I10 Essential (primary) hypertension: Secondary | ICD-10-CM | POA: Diagnosis not present

## 2015-04-08 MED ORDER — EXENATIDE ER 2 MG ~~LOC~~ PEN: 2.0000 mg | PEN_INJECTOR | SUBCUTANEOUS | Status: DC

## 2015-04-08 MED ORDER — ATORVASTATIN CALCIUM 40 MG PO TABS: 40.0000 mg | ORAL_TABLET | Freq: Every day | ORAL | Status: DC

## 2015-04-08 MED ORDER — CARVEDILOL 25 MG PO TABS: 25.0000 mg | ORAL_TABLET | Freq: Two times a day (BID) | ORAL | Status: DC

## 2015-04-08 MED ORDER — AMLODIPINE BESYLATE 10 MG PO TABS: 10.0000 mg | ORAL_TABLET | Freq: Every day | ORAL | Status: DC

## 2015-04-08 MED ORDER — ASPIRIN EC 81 MG PO TBEC: 81.0000 mg | DELAYED_RELEASE_TABLET | Freq: Every day | ORAL | Status: DC

## 2015-04-08 MED ORDER — VALSARTAN-HYDROCHLOROTHIAZIDE 320-25 MG PO TABS: 1.0000 | ORAL_TABLET | Freq: Every day | ORAL | Status: DC

## 2015-04-08 NOTE — Progress Notes (Signed)
  Subjective:    CC: follow-up  HPI: Hypertension: Has worsened as he has improved post urosepsis. No headaches, visual changes, chest pain.  Obesity: Trulicity was not covered.  Past medical history, Surgical history, Family history not pertinant except as noted below, Social history, Allergies, and medications have been entered into the medical record, reviewed, and no changes needed.   Review of Systems: No fevers, chills, night sweats, weight loss, chest pain, or shortness of breath.   Objective:    General: Well Developed, well nourished, and in no acute distress.  Neuro: Alert and oriented x3, extra-ocular muscles intact, sensation grossly intact.  HEENT: Normocephalic, atraumatic, pupils equal round reactive to light, neck supple, no masses, no lymphadenopathy, thyroid nonpalpable.  Skin: Warm and dry, no rashes. Cardiac: Regular rate and rhythm, no murmurs rubs or gallops, no lower extremity edema.  Respiratory: Clear to auscultation bilaterally. Not using accessory muscles, speaking in full sentences.  Impression and Recommendations:    I spent 25 minutes with this patient, greater than 50% was face-to-face time counseling regarding the above diagnoses

## 2015-04-08 NOTE — Assessment & Plan Note (Signed)
Patient should continue with all of his current blood pressure medications. I am going to switch him to valsartan/hydrochlorothiazide Recheck blood pressure again in one month

## 2015-04-08 NOTE — Assessment & Plan Note (Signed)
Unable to afford Trulicity, switching to [employer] System[employer]dureon

## 2015-04-09 LAB — COMPREHENSIVE METABOLIC PANEL WITH GFR
BUN: 20 mg/dL (ref 7–25)
CO2: 25 mmol/L (ref 20–31)
Chloride: 103 mmol/L (ref 98–110)
Creat: 1.73 mg/dL — ABNORMAL HIGH (ref 0.70–1.18)
Glucose, Bld: 116 mg/dL — ABNORMAL HIGH (ref 65–99)
Potassium: 4.2 mmol/L (ref 3.5–5.3)
Sodium: 138 mmol/L (ref 135–146)

## 2015-04-09 LAB — COMPREHENSIVE METABOLIC PANEL
ALT: 16 U/L (ref 9–46)
AST: 17 U/L (ref 10–35)
Albumin: 4.1 g/dL (ref 3.6–5.1)
Alkaline Phosphatase: 34 U/L — ABNORMAL LOW (ref 40–115)
Calcium: 9.2 mg/dL (ref 8.6–10.3)
Total Bilirubin: 0.4 mg/dL (ref 0.2–1.2)
Total Protein: 6.5 g/dL (ref 6.1–8.1)

## 2015-04-09 LAB — CBC
HCT: 39.8 % (ref 39.0–52.0)
Hemoglobin: 13.1 g/dL (ref 13.0–17.0)
MCH: 29.4 pg (ref 26.0–34.0)
MCHC: 32.9 g/dL (ref 30.0–36.0)
MCV: 89.2 fL (ref 78.0–100.0)
MPV: 9.5 fL (ref 8.6–12.4)
Platelets: 164 K/uL (ref 150–400)
RBC: 4.46 MIL/uL (ref 4.22–5.81)
RDW: 14.1 % (ref 11.5–15.5)
WBC: 9.4 10*3/uL (ref 4.0–10.5)

## 2015-05-06 ENCOUNTER — Ambulatory Visit: Payer: Medicare PPO | Admitting: Sports Medicine

## 2015-05-07 ENCOUNTER — Ambulatory Visit (INDEPENDENT_AMBULATORY_CARE_PROVIDER_SITE_OTHER): Payer: Medicare FFS

## 2015-05-07 ENCOUNTER — Encounter: Payer: Self-pay | Admitting: Sports Medicine

## 2015-05-07 ENCOUNTER — Ambulatory Visit (INDEPENDENT_AMBULATORY_CARE_PROVIDER_SITE_OTHER): Payer: Medicare PPO | Admitting: Sports Medicine

## 2015-05-07 VITALS — BP 115/70 | HR 77 | Resp 20 | Wt 304.8 lb

## 2015-05-07 DIAGNOSIS — R0602 Shortness of breath: Secondary | ICD-10-CM | POA: Diagnosis not present

## 2015-05-07 DIAGNOSIS — R05 Cough: Secondary | ICD-10-CM | POA: Diagnosis not present

## 2015-05-07 DIAGNOSIS — J209 Acute bronchitis, unspecified: Secondary | ICD-10-CM | POA: Insufficient documentation

## 2015-05-07 DIAGNOSIS — I10 Essential (primary) hypertension: Secondary | ICD-10-CM

## 2015-05-07 MED ORDER — DOXYCYCLINE HYCLATE 100 MG PO TABS: 100.0000 mg | ORAL_TABLET | Freq: Two times a day (BID) | ORAL | Status: AC

## 2015-05-07 MED ORDER — PREDNISONE 50 MG PO TABS: 50.0000 mg | ORAL_TABLET | Freq: Every day | ORAL | Status: DC

## 2015-05-07 NOTE — Assessment & Plan Note (Signed)
Prednisone, doxycycline.

## 2015-05-07 NOTE — Progress Notes (Signed)
  Subjective:    CC: follow-up  HPI: Hypertension: Controlled.  Cough: Moderate, persistent, worsening, mild low-grade fevers, no chest pain or shortness of breath. Keeping him up at night.  Past medical history, Surgical history, Family history not pertinant except as noted below, Social history, Allergies, and medications have been entered into the medical record, reviewed, and no changes needed.   Review of Systems: No fevers, chills, night sweats, weight loss, chest pain, or shortness of breath.   Objective:    General: Well Developed, well nourished, and in no acute distress.  Neuro: Alert and oriented x3, extra-ocular muscles intact, sensation grossly intact.  HEENT: Normocephalic, atraumatic, pupils equal round reactive to light, neck supple, no masses, no lymphadenopathy, thyroid nonpalpable.  Skin: Warm and dry, no rashes. Cardiac: Regular rate and rhythm, no murmurs rubs or gallops, no lower extremity edema.  Respiratory: Clear to auscultation bilaterally. Not using accessory muscles, speaking in full sentences.air movement is poor.  Impression and Recommendations:    I spent 25 minutes with this patient, greater than 50% was face-to-face time counseling regarding the above diagnoses

## 2015-05-07 NOTE — Assessment & Plan Note (Signed)
Blood pressure is now perfectly controlled on amlodipine, carvedilol, valsartan/hydrochlorothiazide.

## 2015-06-04 ENCOUNTER — Ambulatory Visit: Payer: Medicare PPO | Admitting: Sports Medicine

## 2015-06-06 ENCOUNTER — Ambulatory Visit (INDEPENDENT_AMBULATORY_CARE_PROVIDER_SITE_OTHER): Payer: Medicare FFS | Admitting: Sports Medicine

## 2015-06-06 ENCOUNTER — Encounter: Payer: Self-pay | Admitting: Sports Medicine

## 2015-06-06 VITALS — BP 105/47 | HR 63 | Resp 16 | Wt 311.7 lb

## 2015-06-06 DIAGNOSIS — J209 Acute bronchitis, unspecified: Secondary | ICD-10-CM

## 2015-06-06 DIAGNOSIS — I10 Essential (primary) hypertension: Secondary | ICD-10-CM

## 2015-06-06 NOTE — Assessment & Plan Note (Signed)
Well-controlled, no changes needed 

## 2015-06-06 NOTE — Assessment & Plan Note (Signed)
Resolved with antibiotics and steroids.

## 2015-06-06 NOTE — Progress Notes (Signed)
  Subjective:    CC:  Follow-up  HPI: Hypertension: Controlled  Acute bronchitis: Resolved  Past medical history, Surgical history, Family history not pertinant except as noted below, Social history, Allergies, and medications have been entered into the medical record, reviewed, and no changes needed.   Review of Systems: No fevers, chills, night sweats, weight loss, chest pain, or shortness of breath.   Objective:    General: Well Developed, well nourished, and in no acute distress.  Neuro: Alert and oriented x3, extra-ocular muscles intact, sensation grossly intact.  HEENT: Normocephalic, atraumatic, pupils equal round reactive to light, neck supple, no masses, no lymphadenopathy, thyroid nonpalpable.  Skin: Warm and dry, no rashes. Cardiac: Regular rate and rhythm, no murmurs rubs or gallops, no lower extremity edema.  Respiratory: Clear to auscultation bilaterally. Not using accessory muscles, speaking in full sentences.  Impression and Recommendations:

## 2015-06-17 ENCOUNTER — Ambulatory Visit (INDEPENDENT_AMBULATORY_CARE_PROVIDER_SITE_OTHER): Payer: Medicare FFS

## 2015-06-17 ENCOUNTER — Ambulatory Visit (INDEPENDENT_AMBULATORY_CARE_PROVIDER_SITE_OTHER): Payer: Medicare FFS | Admitting: Sports Medicine

## 2015-06-17 ENCOUNTER — Encounter: Payer: Self-pay | Admitting: Sports Medicine

## 2015-06-17 VITALS — BP 128/68 | HR 75 | Resp 18 | Wt 308.7 lb

## 2015-06-17 DIAGNOSIS — M1000 Idiopathic gout, unspecified site: Secondary | ICD-10-CM | POA: Diagnosis not present

## 2015-06-17 DIAGNOSIS — M79672 Pain in left foot: Secondary | ICD-10-CM

## 2015-06-17 DIAGNOSIS — M109 Gout, unspecified: Secondary | ICD-10-CM | POA: Insufficient documentation

## 2015-06-17 NOTE — Progress Notes (Signed)
  Subjective:    CC: Left foot swelling  HPI: This is a pleasant 80 year old male, for the past several days he's noted rapid onset swelling and erythema in his left first metatarsophalangeal joint. Pain is severe, worse with minor movement. Nothing out of the ordinary happened before the swelling developed, had spaghetti with meat sauce without alcohol the evening before.  Past medical history, Surgical history, Family history not pertinant except as noted below, Social history, Allergies, and medications have been entered into the medical record, reviewed, and no changes needed.   Review of Systems: No fevers, chills, night sweats, weight loss, chest pain, or shortness of breath.   Objective:    General: Well Developed, well nourished, and in no acute distress.  Neuro: Alert and oriented x3, extra-ocular muscles intact, sensation grossly intact.  HEENT: Normocephalic, atraumatic, pupils equal round reactive to light, neck supple, no masses, no lymphadenopathy, thyroid nonpalpable.  Skin: Warm and dry, no rashes. Cardiac: Regular rate and rhythm, no murmurs rubs or gallops, no lower extremity edema.  Respiratory: Clear to auscultation bilaterally. Not using accessory muscles, speaking in full sentences. Left foot: Visibly swollen, tender, erythematous first metatarsophalangeal joint without induration. Warm to the touch, pain with gentle movement and gentle palpation of the MTP.  Procedure: Real-time Ultrasound Guided Injection of left first MTP Device: GE Logiq E  Verbal informed consent obtained.  Time-out conducted.  Noted no overlying erythema, induration, or other signs of local infection.  Skin prepped in a sterile fashion.  Local anesthesia: Topical Ethyl chloride.  With sterile technique and under real time ultrasound guidance:  Attempt at aspiration failed, 1/2 mL kenalog 40, 1/2 mL lidocaine injected easily. Completed without difficulty  Pain immediately resolved suggesting  accurate placement of the medication.  Advised to call if fevers/chills, erythema, induration, drainage, or persistent bleeding.  Images permanently stored and available for review in the ultrasound unit.  Impression: Technically successful ultrasound guided injection.  The foot was then strapped with compressive dressing.  Impression and Recommendations:

## 2015-06-17 NOTE — Assessment & Plan Note (Addendum)
Appears to be acute gout flare, apiration and injection. Unable to aspirate any fluid. Serum uric acid levels,return in one month.  Uric acid level is ridiculously high, go ahead and start allopurinol high-dose 300 twice a day.

## 2015-06-18 LAB — CBC WITH DIFFERENTIAL/PLATELET
Basophils Absolute: 113 cells/uL (ref 0–200)
Basophils Relative: 1 %
Eosinophils Absolute: 339 cells/uL (ref 15–500)
Eosinophils Relative: 3 %
HCT: 39.9 % (ref 38.5–50.0)
Hemoglobin: 13.3 g/dL (ref 13.2–17.1)
Lymphocytes Relative: 21 %
Lymphs Abs: 2373 {cells}/uL (ref 850–3900)
MCH: 29.2 pg (ref 27.0–33.0)
MCHC: 33.3 g/dL (ref 32.0–36.0)
MCV: 87.5 fL (ref 80.0–100.0)
MPV: 9.7 fL (ref 7.5–12.5)
Monocytes Absolute: 1808 cells/uL — ABNORMAL HIGH (ref 200–950)
Monocytes Relative: 16 %
Neutro Abs: 6667 {cells}/uL (ref 1500–7800)
Neutrophils Relative %: 59 %
Platelets: 162 K/uL (ref 140–400)
RBC: 4.56 MIL/uL (ref 4.20–5.80)
RDW: 14.7 % (ref 11.0–15.0)
WBC: 11.3 K/uL — ABNORMAL HIGH (ref 3.8–10.8)

## 2015-06-18 LAB — URIC ACID: Uric Acid, Serum: 16.2 mg/dL — ABNORMAL HIGH (ref 4.0–7.8)

## 2015-06-18 MED ORDER — ALLOPURINOL 300 MG PO TABS: 300.0000 mg | ORAL_TABLET | Freq: Two times a day (BID) | ORAL | Status: DC

## 2015-06-18 NOTE — Addendum Note (Signed)
 Addended by: Monica BectonHEKKEKANDAM,  J on: 06/18/2015 10:17 AM   Modules accepted: Orders

## 2015-06-26 ENCOUNTER — Emergency Department
Admission: EM | Admit: 2015-06-26 | Discharge: 2015-06-26 | Disposition: A | Discharge status: Home / Self Care | Payer: Medicare FFS | Source: Home / Self Care | Attending: Family Medicine | Admitting: Family Medicine

## 2015-06-26 ENCOUNTER — Encounter: Payer: Self-pay | Admitting: *Deleted

## 2015-06-26 DIAGNOSIS — M546 Pain in thoracic spine: Secondary | ICD-10-CM

## 2015-06-26 DIAGNOSIS — M62830 Muscle spasm of back: Secondary | ICD-10-CM | POA: Diagnosis not present

## 2015-06-26 DIAGNOSIS — M549 Dorsalgia, unspecified: Secondary | ICD-10-CM

## 2015-06-26 MED ORDER — TRAMADOL HCL 50 MG PO TABS: 50.0000 mg | ORAL_TABLET | Freq: Four times a day (QID) | ORAL | Status: DC | PRN

## 2015-06-26 MED ORDER — CYCLOBENZAPRINE HCL 10 MG PO TABS: 10.0000 mg | ORAL_TABLET | Freq: Two times a day (BID) | ORAL | Status: DC | PRN

## 2015-06-26 NOTE — Discharge Instructions (Signed)
Tramadol is strong pain medication. While taking, do not drink alcohol, drive, or perform any other activities that requires focus while taking these medications.   Flexeril is a muscle relaxer and may cause drowsiness. Do not drink alcohol, drive, or operate heavy machinery while taking.  You may take  acetaminophen every 4-6 hours for pain as well as increase dose of Aspirin from  daily to up to -  every 4-6 hours for pain.  Do not take increased dose of aspirin for longer than 5 days without follow up with primary care provider.   Back Exercises If you have pain in your back, do these exercises 2-3 times each day or as told by your doctor. When the pain goes away, do the exercises once each day, but repeat the steps more times for each exercise (do more repetitions). If you do not have pain in your back, do these exercises once each day or as told by your doctor. EXERCISES Single Knee to Chest Do these steps 3-5 times in a row for each leg: 1. Lie on your back on a firm bed or the floor with your legs stretched out. 2. Bring one knee to your chest. 3. Hold your knee to your chest by grabbing your knee or thigh. 4. Pull on your knee until you feel a gentle stretch in your lower back. 5. Keep doing the stretch for 10-30 seconds. 6. Slowly let go of your leg and straighten it. Pelvic Tilt Do these steps 5-10 times in a row: 1. Lie on your back on a firm bed or the floor with your legs stretched out. 2. Bend your knees so they point up to the ceiling. Your feet should be flat on the floor. 3. Tighten your lower belly (abdomen) muscles to press your lower back against the floor. This will make your tailbone point up to the ceiling instead of pointing down to your feet or the floor. 4. Stay in this position for 5-10 seconds while you gently tighten your muscles and breathe evenly. Cat-Cow Do these steps until your lower back bends more easily: 1. Get on your hands and knees  on a firm surface. Keep your hands under your shoulders, and keep your knees under your hips. You may put padding under your knees. 2. Let your head hang down, and make your tailbone point down to the floor so your lower back is round like the back of a cat. 3. Stay in this position for 5 seconds. 4. Slowly lift your head and make your tailbone point up to the ceiling so your back hangs low (sags) like the back of a cow. 5. Stay in this position for 5 seconds. Press-Ups Do these steps 5-10 times in a row: 1. Lie on your belly (face-down) on the floor. 2. Place your hands near your head, about shoulder-width apart. 3. While you keep your back relaxed and keep your hips on the floor, slowly straighten your arms to raise the top half of your body and lift your shoulders. Do not use your back muscles. To make yourself more comfortable, you may change where you place your hands. 4. Stay in this position for 5 seconds. 5. Slowly return to lying flat on the floor. Bridges Do these steps 10 times in a row: 1. Lie on your back on a firm surface. 2. Bend your knees so they point up to the ceiling. Your feet should be flat on the floor. 3. Tighten your butt muscles and lift your butt  off of the floor until your waist is almost as high as your knees. If you do not feel the muscles working in your butt and the back of your thighs, slide your feet 1-2 inches farther away from your butt. 4. Stay in this position for 3-5 seconds. 5. Slowly lower your butt to the floor, and let your butt muscles relax. If this exercise is too easy, try doing it with your arms crossed over your chest. Belly Crunches Do these steps 5-10 times in a row: 1. Lie on your back on a firm bed or the floor with your legs stretched out. 2. Bend your knees so they point up to the ceiling. Your feet should be flat on the floor. 3. Cross your arms over your chest. 4. Tip your chin a little bit toward your chest but do not bend your  neck. 5. Tighten your belly muscles and slowly raise your chest just enough to lift your shoulder blades a tiny bit off of the floor. 6. Slowly lower your chest and your head to the floor. Back Lifts Do these steps 5-10 times in a row: 1. Lie on your belly (face-down) with your arms at your sides, and rest your forehead on the floor. 2. Tighten the muscles in your legs and your butt. 3. Slowly lift your chest off of the floor while you keep your hips on the floor. Keep the back of your head in line with the curve in your back. Look at the floor while you do this. 4. Stay in this position for 3-5 seconds. 5. Slowly lower your chest and your face to the floor. GET HELP IF:  Your back pain gets a lot worse when you do an exercise.  Your back pain does not lessen 2 hours after you exercise. If you have any of these problems, stop doing the exercises. Do not do them again unless your doctor says it is okay. GET HELP RIGHT AWAY IF:  You have sudden, very bad back pain. If this happens, stop doing the exercises. Do not do them again unless your doctor says it is okay.   This information is not intended to replace advice given to you by your health care provider. Make sure you discuss any questions you have with your health care provider.   Document Released: 02/21/2010 Document Revised: 10/10/2014 Document Reviewed: 03/15/2014 Elsevier Interactive Patient Education [employer]2016 Elsevier Inc.

## 2015-06-26 NOTE — ED Provider Notes (Signed)
CSN: [id number]     Arrival date & time 06/26/15  1439 History   First MD Initiated Contact with Patient 06/26/15 1441     Chief Complaint  Patient presents with  . Shoulder Pain   (Consider location/radiation/quality/duration/timing/severity/associated sxs/prior Treatment) Patient is a 80 y.o. male presenting with shoulder pain.  Shoulder Pain Associated symptoms: back pain   Associated symptoms: no fever and no neck pain     The pt is a 80yo male accompanied by his wife and daughter, c/o Right posterior shoulder pain around his shoulder blade for about 2 days. Pain is aching and sore, sharp on occasion, only with certain movements and certain positions.  Pain is 10/10 at worst, denies pain at this time. Denies chest pain or SOB. Denies neck pain, Right arm pain or numbness. Denies known injury. He has not tried anything for pain.  Pt does take  aspirin daily.    Past Medical History  Diagnosis Date  . Cataract   . Macular degeneration, left eye   . Arthritis   . Sleep apnea     2008.Marland KitchenMarland KitchenWEARS CPAP  . Hypertension     takes Amlodipine,Lisinopril,and Metoprolol daily   Past Surgical History  Procedure Laterality Date  . Knee surgery    . Cardiac catheterization  06-10-12  . Eye surgery      CATARACT RIGHT EYE  . Tonsillectomy    . Total knee arthroplasty Left 09/16/2012    Procedure: TOTAL KNEE ARTHROPLASTY;  Surgeon: Harvie Junior, MD;  Location: [facility] OR;  Service: Orthopedics;  Laterality: Left;  . Lumbar laminectomy/decompression microdiscectomy N/A 11/02/2012    Procedure: LUMBAR LAMINECTOMY/DECOMPRESSION MICRODISCECTOMY 2 LEVEL;  Surgeon: Emilee Hero, MD;  Location: [facility] OR;  Service: Orthopedics;  Laterality: N/A;  Lumbar 4-5,lumbar 5-sacrum 1 decompression  . Total hip arthroplasty Left 01/06/2013    Procedure: LEFT TOTAL HIP ARTHROPLASTY-Posterior approach;  Surgeon: Harvie Junior, MD;  Location: [facility] OR;  Service: Orthopedics;  Laterality: Left;  . Colonoscopy    .  Back surgery    . Total hip arthroplasty Right 04/21/2013    Procedure: TOTAL HIP ARTHROPLASTY ANTERIOR APPROACH;  Surgeon: Harvie Junior, MD;  Location: [facility] OR;  Service: Orthopedics;  Laterality: Right;   Family History  Problem Relation Age of Onset  . Cancer Daughter    Social History  Substance Use Topics  . Smoking status: Former Smoker -- 1.00 packs/day for 20 years    Types: Cigarettes    Quit date: 01/04/1993  . Smokeless tobacco: Former Neurosurgeon    Types: Chew    Quit date: 01/04/1993     Comment: quit 90yrs ago  . Alcohol Use: No    Review of Systems  Constitutional: Negative for fever and chills.  HENT: Negative for congestion, postnasal drip and rhinorrhea.   Respiratory: Negative for cough, chest tightness and shortness of breath.   Cardiovascular: Negative for chest pain and palpitations.  Gastrointestinal: Negative for nausea, vomiting, abdominal pain and diarrhea.  Musculoskeletal: Positive for myalgias, back pain and gait problem ( chronic-uses walker for assistance. ). Negative for joint swelling, arthralgias, neck pain and neck stiffness.  Neurological: Negative for weakness and numbness.    Allergies  Review of patient's allergies indicates no known allergies.  Home Medications   Prior to Admission medications   Medication Sig Start Date End Date Taking? Authorizing Provider  allopurinol (ZYLOPRIM) 300 MG tablet Take 1 tablet (300 mg total) by mouth 2 (two) times daily. 06/18/15   Maisie Fus  Windell Moment, MD  amLODipine (NORVASC) 10 MG tablet Take 1 tablet (10 mg total) by mouth daily. 04/08/15   Monica Becton, MD  aspirin 81 MG tablet Take 1 tablet (81 mg total) by mouth daily. 04/08/15   Monica Becton, MD  atorvastatin (LIPITOR) 40 MG tablet Take 1 tablet (40 mg total) by mouth daily at 6 PM. Reported on 04/08/2015 04/08/15 04/07/16  Monica Becton, MD  carvedilol (COREG) 25 MG tablet Take 1 tablet (25 mg total) by mouth 2 (two) times daily with a  meal. 04/08/15   Monica Becton, MD  cyclobenzaprine (FLEXERIL) 10 MG tablet Take 1 tablet (10 mg total) by mouth 2 (two) times daily as needed for muscle spasms. 06/26/15   Junius Finner, PA-C  Exenatide ER 2 MG PEN Inject 2 mg into the skin once a week. 04/08/15   Monica Becton, MD  gabapentin (NEURONTIN) 600 MG tablet TAKE 1 TABLET THREE TIMES DAILY 04/26/14   Monica Becton, MD  methocarbamol (ROBAXIN-750) 500 MG tablet Take 1.5 tablets (750 mg total) by mouth every 8 (eight) hours as needed for muscle spasms. 04/21/13   Marshia Ly, PA-C  nitroGLYCERIN (NITROSTAT) 0.4 MG SL tablet Place under the tongue. Reported on 04/08/2015 02/22/15 02/22/16  Historical Provider, MD  traMADol (ULTRAM) 50 MG tablet Take 1 tablet (50 mg total) by mouth every 6 (six) hours as needed. 06/26/15   Junius Finner, PA-C  valsartan-hydrochlorothiazide (DIOVAN-HCT) 320-25 MG tablet Take 1 tablet by mouth daily. 04/08/15   Monica Becton, MD   Meds Ordered and Administered this Visit  Medications - No data to display  BP 128/76 mmHg  Pulse 74  Temp(Src) 97.9 F (36.6 C) (Oral)  Resp 18  Ht 5\' 9"  (1.753 m)  Wt 310 lb (140.615 kg)  BMI 45.76 kg/m2  SpO2 95% No data found.   Physical Exam  Constitutional: He is oriented to person, place, and time. He appears well-developed and well-nourished.  HENT:  Head: Normocephalic and atraumatic.  Eyes: EOM are normal.  Neck: Normal range of motion. Neck supple.  No midline bone tenderness, no crepitus or step-offs.   Cardiovascular: Normal rate, regular rhythm and normal heart sounds.   Pulses:      Radial pulses are 2+ on the right side.  Pulmonary/Chest: Effort normal and breath sounds normal. No respiratory distress. He has no wheezes. He has no rales.  Musculoskeletal: Normal range of motion. He exhibits tenderness. He exhibits no edema.  No midline spinal tenderness. Tenderness to Right upper trapezius and rhomboid muscle. Palpable muscle  spasm. Full ROM upper extremities with 5/5 strength bilaterally.   Neurological: He is alert and oriented to person, place, and time.  Skin: Skin is warm and dry. No rash noted. No erythema.  Psychiatric: He has a normal mood and affect. His behavior is normal.  Nursing note and vitals reviewed.   ED Course  Procedures (including critical care time)  Labs Review Labs Reviewed - No data to display  Imaging Review No results found.    MDM   1. Back muscle spasm   2. Upper back pain on right side    Pt c/o Right upper back pain around shoulder blade. Pt uses walker daily to help with ambulation.  No known injury.   Vitals: WNL, O2 Sat 95% on RA  Denies SOB. Palpable muscle spasm on exam. Heart- Regular rate and rhythm Lungs- CTAB  Doubt ACS, PE, or other emergent process taking place  at this time. Pain likely due to muscle spasm. Rx: Tramadol, flexeril, and may have 325-650mg  aspirin for pain every 4-6 hours, no longer than 5 days.   F/u with PCP in 1 week if not improving, sooner if worsening. Discussed symptoms that warrant emergent care in the ED. Patient verbalized understanding and agreement with treatment plan.    Junius Finnerrin O'Malley, PA-C 06/26/15 1538

## 2015-06-26 NOTE — ED Notes (Signed)
Pt c/o pain under his RT shoulder blade area x 2 days with movement. Reports no pain when sitting still. Denies injury. No OTC meds.

## 2015-07-04 ENCOUNTER — Ambulatory Visit: Payer: Medicare FFS | Admitting: Sports Medicine

## 2015-08-14 ENCOUNTER — Other Ambulatory Visit: Payer: Self-pay | Admitting: Sports Medicine

## 2015-11-21 ENCOUNTER — Encounter: Payer: Self-pay | Admitting: Sports Medicine

## 2015-11-21 ENCOUNTER — Ambulatory Visit (INDEPENDENT_AMBULATORY_CARE_PROVIDER_SITE_OTHER): Payer: Medicare FFS | Admitting: Sports Medicine

## 2015-11-21 DIAGNOSIS — R600 Localized edema: Secondary | ICD-10-CM | POA: Diagnosis not present

## 2015-11-21 LAB — CBC
HCT: 39.1 % (ref 38.5–50.0)
Hemoglobin: 13.2 g/dL (ref 13.2–17.1)
MCH: 29.5 pg (ref 27.0–33.0)
MCHC: 33.8 g/dL (ref 32.0–36.0)
MCV: 87.5 fL (ref 80.0–100.0)
MPV: 9.5 fL (ref 7.5–12.5)
Platelets: 136 10*3/uL — ABNORMAL LOW (ref 140–400)
RBC: 4.47 MIL/uL (ref 4.20–5.80)
RDW: 15 % (ref 11.0–15.0)
WBC: 10.5 K/uL (ref 3.8–10.8)

## 2015-11-21 MED ORDER — DOXYCYCLINE HYCLATE 100 MG PO TABS: 100.0000 mg | ORAL_TABLET | Freq: Two times a day (BID) | ORAL | 0 refills | Status: AC

## 2015-11-21 NOTE — Assessment & Plan Note (Addendum)
Patient has doubled his Demadex this week. He will wear his compression hose. Checking blood work, CBC, CMP, BNP, d-dimer, urinalysis. He does have some erythema over the anterior right lower leg as well as induration, likely early cellulitis, adding doxycycline. Return to see me in 2 weeks. His weight is down from the last visit. Likely from the Demadex. I simply think he needs to use compression to mobilize the fluid back into his circulation from his legs.  Positive d-dimer, ordering stat lower extremity bilateral Doppler's

## 2015-11-21 NOTE — Progress Notes (Signed)
  Subjective:    CC: Leg swelling  HPI: For the past several weeks after a fall this pleasant 80 year old male has had increasing swelling in both legs, moderate, persistent. Denies any orthopnea, no PND, no recent long car rides or immobilization. He does have some abrasions over his right kneecap with some redness going down the right anterior lower leg. No shortness of breath. No chest pain.  Past medical history:  Negative.  See flowsheet/record as well for more information.  Surgical history: Negative.  See flowsheet/record as well for more information.  Family history: Negative.  See flowsheet/record as well for more information.  Social history: Negative.  See flowsheet/record as well for more information.  Allergies, and medications have been entered into the medical record, reviewed, and no changes needed.   Review of Systems: No fevers, chills, night sweats, weight loss, chest pain, or shortness of breath.   Objective:    General: Well Developed, well nourished, and in no acute distress.  Neuro: Alert and oriented x3, extra-ocular muscles intact, sensation grossly intact.  HEENT: Normocephalic, atraumatic, pupils equal round reactive to light, neck supple, no masses, no lymphadenopathy, thyroid nonpalpable.  Skin: Warm and dry, no rashes. Cardiac: Regular rate and rhythm, no murmurs rubs or gallops, no lower extremity edema.  Respiratory: Clear to auscultation bilaterally. Not using accessory muscles, speaking in full sentences. Legs: 3+ pitting edema with erythema and induration on the right side. Negative Homans sign bilaterally. Dorsalis pedis and posterior tibial pulses are intact.  Impression and Recommendations:    Lower extremity edema Patient has doubled his Demadex this week. He will wear his compression hose. Checking blood work, CBC, CMP, BNP, d-dimer, urinalysis. He does have some erythema over the anterior right lower leg as well as induration, likely early  cellulitis, adding doxycycline. Return to see me in 2 weeks. His weight is down from the last visit. Likely from the Demadex. I simply think he needs to use compression to mobilize the fluid back into his circulation from his legs.  I spent 25 minutes with this patient, greater than 50% was face-to-face time counseling regarding the above diagnoses

## 2015-11-22 ENCOUNTER — Ambulatory Visit (HOSPITAL_BASED_OUTPATIENT_CLINIC_OR_DEPARTMENT_OTHER)
Admission: RE | Admit: 2015-11-22 | Discharge: 2015-11-22 | Disposition: A | Discharge status: Home / Self Care | Payer: Medicare FFS | Source: Ambulatory Visit | Attending: Sports Medicine | Admitting: Sports Medicine

## 2015-11-22 DIAGNOSIS — R600 Localized edema: Secondary | ICD-10-CM | POA: Insufficient documentation

## 2015-11-22 LAB — URINALYSIS
Bilirubin Urine: NEGATIVE
Glucose, UA: NEGATIVE
Hgb urine dipstick: NEGATIVE
Ketones, ur: NEGATIVE
Leukocytes, UA: NEGATIVE
Nitrite: NEGATIVE
Protein, ur: NEGATIVE
Specific Gravity, Urine: 1.009 (ref 1.001–1.035)
pH: 6.5 (ref 5.0–8.0)

## 2015-11-22 LAB — COMPREHENSIVE METABOLIC PANEL
ALT: 12 U/L (ref 9–46)
AST: 17 U/L (ref 10–35)
Albumin: 4.2 g/dL (ref 3.6–5.1)
Alkaline Phosphatase: 41 U/L (ref 40–115)
BUN: 32 mg/dL — ABNORMAL HIGH (ref 7–25)
CO2: 29 mmol/L (ref 20–31)
Calcium: 9.3 mg/dL (ref 8.6–10.3)
Chloride: 99 mmol/L (ref 98–110)
Creat: 1.97 mg/dL — ABNORMAL HIGH (ref 0.70–1.18)
Glucose, Bld: 104 mg/dL — ABNORMAL HIGH (ref 65–99)
Potassium: 3.7 mmol/L (ref 3.5–5.3)
Sodium: 141 mmol/L (ref 135–146)
Total Bilirubin: 0.9 mg/dL (ref 0.2–1.2)
Total Protein: 6.9 g/dL (ref 6.1–8.1)

## 2015-11-22 LAB — D-DIMER, QUANTITATIVE: D-Dimer, Quant: 2.11 ug{FEU}/mL — ABNORMAL HIGH (ref <0.50)

## 2015-11-22 LAB — BRAIN NATRIURETIC PEPTIDE: Brain Natriuretic Peptide: 78.5 pg/mL (ref <100)

## 2015-11-22 NOTE — Addendum Note (Signed)
Addended by: Monica BectonHEKKEKANDAM, THOMAS J on: 11/22/2015 01:33 PM   Modules accepted: Orders

## 2015-11-27 ENCOUNTER — Other Ambulatory Visit: Payer: Self-pay | Admitting: Sports Medicine

## 2015-11-27 MED ORDER — AMBULATORY NON FORMULARY MEDICATION: 0 refills | Status: DC

## 2015-12-06 ENCOUNTER — Ambulatory Visit (INDEPENDENT_AMBULATORY_CARE_PROVIDER_SITE_OTHER): Payer: Medicare FFS | Admitting: Sports Medicine

## 2015-12-06 DIAGNOSIS — R600 Localized edema: Secondary | ICD-10-CM

## 2015-12-06 NOTE — Progress Notes (Signed)
  Subjective:    CC: Follow-up  HPI: Lower extreme swelling: Improved significantly with doubling his Demadex, and finally wearing his compression hose. He did have a positive d-dimer, lower extremity Dopplers were negative. He tells me his legs feel significantly better.  Past medical history:  Negative.  See flowsheet/record as well for more information.  Surgical history: Negative.  See flowsheet/record as well for more information.  Family history: Negative.  See flowsheet/record as well for more information.  Social history: Negative.  See flowsheet/record as well for more information.  Allergies, and medications have been entered into the medical record, reviewed, and no changes needed.   Review of Systems: No fevers, chills, night sweats, weight loss, chest pain, or shortness of breath.   Objective:    General: Well Developed, well nourished, and in no acute distress.  Neuro: Alert and oriented x3, extra-ocular muscles intact, sensation grossly intact.  HEENT: Normocephalic, atraumatic, pupils equal round reactive to light, neck supple, no masses, no lymphadenopathy, thyroid nonpalpable.  Skin: Warm and dry, no rashes. Cardiac: Regular rate and rhythm, no murmurs rubs or gallops, no lower extremity edema.  Respiratory: Clear to auscultation bilaterally. Not using accessory muscles, speaking in full sentences. Lower extremities: 1+ edema and currently doing well in compression hose.  Impression and Recommendations:    Lower extremity edema Improved significantly with doubling his Demadex, and finally wearing his compression hose. Lower extremity Dopplers were negative. We simply need to mobilize the fluid from his legs back to his circulation to be voided out. Decrease Demadex back to original dose and return in 6 months.

## 2015-12-06 NOTE — Assessment & Plan Note (Signed)
Improved significantly with doubling his Demadex, and finally wearing his compression hose. Lower extremity Dopplers were negative. We simply need to mobilize the fluid from his legs back to his circulation to be voided out. Decrease Demadex back to original dose and return in 6 months.

## 2015-12-13 ENCOUNTER — Other Ambulatory Visit: Payer: Self-pay | Admitting: Sports Medicine

## 2015-12-13 DIAGNOSIS — R600 Localized edema: Secondary | ICD-10-CM

## 2015-12-25 ENCOUNTER — Other Ambulatory Visit: Payer: Self-pay

## 2015-12-25 MED ORDER — MELOXICAM 15 MG PO TABS: 15.0000 mg | ORAL_TABLET | Freq: Every day | ORAL | 3 refills | Status: DC

## 2016-04-01 ENCOUNTER — Other Ambulatory Visit: Payer: Self-pay | Admitting: Sports Medicine

## 2016-04-01 DIAGNOSIS — I10 Essential (primary) hypertension: Secondary | ICD-10-CM

## 2016-06-09 LAB — HM DIABETES EYE EXAM

## 2016-06-16 ENCOUNTER — Encounter: Payer: Self-pay | Admitting: Sports Medicine

## 2016-07-20 ENCOUNTER — Other Ambulatory Visit: Payer: Self-pay | Admitting: Sports Medicine

## 2016-07-20 DIAGNOSIS — I10 Essential (primary) hypertension: Secondary | ICD-10-CM

## 2016-08-24 ENCOUNTER — Telehealth: Payer: Self-pay | Admitting: Sports Medicine

## 2016-08-24 NOTE — Telephone Encounter (Signed)
Pt's daughter called to advise PCP that the Pt is having neck surgery today. Request an order for a hospital bed. They have used [facility] in the past, Victorino DikeJennifer can be reached at [phone] for any questions.

## 2016-08-24 NOTE — Telephone Encounter (Signed)
Daughter advised.

## 2016-08-24 NOTE — Telephone Encounter (Signed)
This order needs to come from the surgeon doing his procedure, since they will have a better idea of exactly what procedure theyre going to be doing, and what type of hospital bed.

## 2016-09-28 ENCOUNTER — Ambulatory Visit (INDEPENDENT_AMBULATORY_CARE_PROVIDER_SITE_OTHER): Payer: Medicare FFS | Admitting: Sports Medicine

## 2016-09-28 ENCOUNTER — Encounter: Payer: Self-pay | Admitting: Sports Medicine

## 2016-09-28 DIAGNOSIS — Z0000 Encounter for general adult medical examination without abnormal findings: Secondary | ICD-10-CM | POA: Diagnosis not present

## 2016-09-28 DIAGNOSIS — E7800 Pure hypercholesterolemia, unspecified: Secondary | ICD-10-CM | POA: Diagnosis not present

## 2016-09-28 DIAGNOSIS — J181 Lobar pneumonia, unspecified organism: Secondary | ICD-10-CM

## 2016-09-28 DIAGNOSIS — J189 Pneumonia, unspecified organism: Secondary | ICD-10-CM | POA: Insufficient documentation

## 2016-09-28 DIAGNOSIS — M1A0720 Idiopathic chronic gout, left ankle and foot, without tophus (tophi): Secondary | ICD-10-CM | POA: Diagnosis not present

## 2016-09-28 DIAGNOSIS — Z981 Arthrodesis status: Secondary | ICD-10-CM | POA: Insufficient documentation

## 2016-09-28 LAB — CBC
HCT: 33.7 % — ABNORMAL LOW (ref 38.5–50.0)
Hemoglobin: 10.9 g/dL — ABNORMAL LOW (ref 13.2–17.1)
MCH: 29.7 pg (ref 27.0–33.0)
MCHC: 32.3 g/dL (ref 32.0–36.0)
MCV: 91.8 fL (ref 80.0–100.0)
MPV: 8.5 fL (ref 7.5–12.5)
Platelets: 113 K/uL — ABNORMAL LOW (ref 140–400)
RBC: 3.67 MIL/uL — ABNORMAL LOW (ref 4.20–5.80)
RDW: 15.2 % — ABNORMAL HIGH (ref 11.0–15.0)
WBC: 6.2 K/uL (ref 3.8–10.8)

## 2016-09-28 NOTE — Assessment & Plan Note (Signed)
Now having some new onset numbness in his right hand, carpal tunnel syndrome versus residual disc protrusion. He does have a follow-up coming up with his neurosurgeon, I'm going to add a nerve conduction study, he can follow the results up with his PCP.

## 2016-09-28 NOTE — Assessment & Plan Note (Signed)
Rechecking uric acid levels. Goal is less than 5.

## 2016-09-28 NOTE — Progress Notes (Addendum)
  Subjective:    CC: Hospital follow-up  HPI: This is a pleasant 81 year old male, he was recently discharged after a cervical ACDF, complicated by postoperative pneumonia, and a gout flare. Ultimately he recovered completely. His medications were changed around a bit and he returns today without any pulmonary symptoms, Symptoms have improved as well. Unfortunately he is having some pain and tingling in his right hand, this is new since his surgery. He does have a follow-up coming up with neurosurgery. Also has some numbness and tingling in a stocking type distribution in his lower extremities.  Past medical history:  Negative.  See flowsheet/record as well for more information.  Surgical history: Negative.  See flowsheet/record as well for more information.  Family history: Negative.  See flowsheet/record as well for more information.  Social history: Negative.  See flowsheet/record as well for more information.  Allergies, and medications have been entered into the medical record, reviewed, and no changes needed.   Review of Systems: No fevers, chills, night sweats, weight loss, chest pain, or shortness of breath.   Objective:    General: Well Developed, well nourished, and in no acute distress.  Neuro: Alert and oriented x3, extra-ocular muscles intact, sensation grossly intact.  HEENT: Normocephalic, atraumatic, pupils equal round reactive to light, neck supple, no masses, no lymphadenopathy, thyroid nonpalpable.  Skin: Warm and dry, no rashes. Cardiac: Regular rate and rhythm, no murmurs rubs or gallops, no lower extremity edema.  Respiratory: Clear to auscultation bilaterally. Not using accessory muscles, speaking in full sentences.  Impression and Recommendations:    S/P cervical spinal fusion Now having some new onset numbness in his right hand, carpal tunnel syndrome versus residual disc protrusion. He does have a follow-up coming up with his neurosurgeon, I'm going to add a  nerve conduction study, he can follow the results up with his PCP.  Pneumonia Rechecking labs.  Pulmonary symptoms have resolved.  Gout Rechecking uric acid levels. Goal is less than 5.  Hyperlipidemia Rechecking labs.  Overall looks okay, triglycerides are a touch high so adding Lovaza.  I spent 40 minutes with this patient, greater than 50% was face-to-face time counseling regarding the above diagnoses

## 2016-09-28 NOTE — Assessment & Plan Note (Signed)
Rechecking labs.  Pulmonary symptoms have resolved.

## 2016-09-28 NOTE — Assessment & Plan Note (Addendum)
Rechecking labs.  Overall looks okay, triglycerides are a touch high so adding Lovaza.

## 2016-09-29 LAB — COMPREHENSIVE METABOLIC PANEL
ALT: 14 U/L (ref 9–46)
Albumin: 3.7 g/dL (ref 3.6–5.1)
Alkaline Phosphatase: 56 U/L (ref 40–115)
Calcium: 8.7 mg/dL (ref 8.6–10.3)
Potassium: 4.5 mmol/L (ref 3.5–5.3)
Total Protein: 6.2 g/dL (ref 6.1–8.1)

## 2016-09-29 LAB — COMPREHENSIVE METABOLIC PANEL WITH GFR
AST: 15 U/L (ref 10–35)
BUN: 24 mg/dL (ref 7–25)
CO2: 23 mmol/L (ref 20–32)
Chloride: 103 mmol/L (ref 98–110)
Creat: 1.77 mg/dL — ABNORMAL HIGH (ref 0.70–1.18)
Glucose, Bld: 99 mg/dL (ref 65–99)
Sodium: 139 mmol/L (ref 135–146)
Total Bilirubin: 0.5 mg/dL (ref 0.2–1.2)

## 2016-09-29 LAB — LIPID PANEL W/REFLEX DIRECT LDL
Cholesterol: 168 mg/dL (ref <200)
HDL: 31 mg/dL — ABNORMAL LOW (ref >40)
LDL-Cholesterol: 103 mg/dL — ABNORMAL HIGH
Non-HDL Cholesterol (Calc): 137 mg/dL — ABNORMAL HIGH (ref <130)
Total CHOL/HDL Ratio: 5.4 Ratio — ABNORMAL HIGH (ref <5.0)
Triglycerides: 220 mg/dL — ABNORMAL HIGH (ref <150)

## 2016-09-29 LAB — VITAMIN B12: Vitamin B-12: 336 pg/mL (ref 200–1100)

## 2016-09-29 LAB — URIC ACID: Uric Acid, Serum: 7.2 mg/dL (ref 4.0–8.0)

## 2016-09-29 LAB — VITAMIN D 25 HYDROXY (VIT D DEFICIENCY, FRACTURES): Vit D, 25-Hydroxy: 30 ng/mL (ref 30–100)

## 2016-09-29 LAB — HEMOGLOBIN A1C
Hgb A1c MFr Bld: 6 % — ABNORMAL HIGH (ref <5.7)
Mean Plasma Glucose: 126 mg/dL

## 2016-09-29 MED ORDER — OMEGA-3-ACID ETHYL ESTERS 1 G PO CAPS: 2.0000 g | ORAL_CAPSULE | Freq: Two times a day (BID) | ORAL | 3 refills | Status: DC

## 2016-09-29 NOTE — Addendum Note (Signed)
Addended by: Monica Becton on: 09/29/2016 10:34 AM   Modules accepted: Orders

## 2016-10-01 ENCOUNTER — Telehealth: Payer: Self-pay | Admitting: Sports Medicine

## 2016-10-01 NOTE — Telephone Encounter (Signed)
Received call from [place] with [facility] for the following skilled nursing verbal order:  1x week for 1 week 2x week for 2 weeks 1x week for 6 weeks  Verbal given.

## 2016-10-01 NOTE — Telephone Encounter (Signed)
FABULOUS

## 2016-10-06 ENCOUNTER — Telehealth: Payer: Self-pay | Admitting: *Deleted

## 2016-10-06 NOTE — Telephone Encounter (Signed)
Thank you Amber.

## 2016-10-06 NOTE — Telephone Encounter (Signed)
[facility] requesting verbal orders for home PT per her evaluation Friday afternoon.  Gave verbal okay for home pt once a week for two weeks,three times a week for 4 weeks, and two times a week for three weeks.

## 2016-10-20 ENCOUNTER — Other Ambulatory Visit: Payer: Self-pay | Admitting: Sports Medicine

## 2016-10-23 ENCOUNTER — Telehealth: Payer: Self-pay | Admitting: Sports Medicine

## 2016-10-23 NOTE — Telephone Encounter (Signed)
Pt stated that the medication for his BP is lisinopril, and it looks like it was sent to [facility] by Leta several weeks ago.  Can we send a months supply to a local pharmacy to get him through?  Please advise.

## 2016-10-23 NOTE — Telephone Encounter (Signed)
Pt called. He states Doc put him on new bp med(and he's out) and it  has not been called into pharmacy.  Thank you.

## 2016-10-26 ENCOUNTER — Encounter: Payer: Self-pay | Admitting: Sports Medicine

## 2016-10-26 ENCOUNTER — Ambulatory Visit (INDEPENDENT_AMBULATORY_CARE_PROVIDER_SITE_OTHER): Payer: Medicare FFS | Admitting: Sports Medicine

## 2016-10-26 VITALS — BP 113/69 | HR 57 | Wt 327.9 lb

## 2016-10-26 DIAGNOSIS — R600 Localized edema: Secondary | ICD-10-CM | POA: Diagnosis not present

## 2016-10-26 DIAGNOSIS — Z981 Arthrodesis status: Secondary | ICD-10-CM

## 2016-10-26 DIAGNOSIS — I10 Essential (primary) hypertension: Secondary | ICD-10-CM

## 2016-10-26 DIAGNOSIS — M109 Gout, unspecified: Secondary | ICD-10-CM | POA: Diagnosis not present

## 2016-10-26 DIAGNOSIS — Z23 Encounter for immunization: Secondary | ICD-10-CM | POA: Diagnosis not present

## 2016-10-26 MED ORDER — LISINOPRIL 10 MG PO TABS: 10.0000 mg | ORAL_TABLET | Freq: Every day | ORAL | 0 refills | Status: DC

## 2016-10-26 MED ORDER — AMBULATORY NON FORMULARY MEDICATION: 99 refills | Status: AC

## 2016-10-26 MED ORDER — ALLOPURINOL 300 MG PO TABS: 300.0000 mg | ORAL_TABLET | Freq: Two times a day (BID) | ORAL | 1 refills | Status: DC

## 2016-10-26 MED ORDER — TORSEMIDE 20 MG PO TABS: ORAL_TABLET | ORAL | 3 refills | Status: DC

## 2016-10-26 MED ORDER — LISINOPRIL 10 MG PO TABS
10.0000 mg | ORAL_TABLET | Freq: Every day | ORAL | 1 refills | Status: DC
Start: 2016-10-26 — End: 2016-11-06

## 2016-10-26 NOTE — Progress Notes (Signed)
  Subjective:    CC: Follow-up  HPI: Hand paresthesias: Status post multilevel cervical fusion, he did touch base with his neurosurgeon who has recommended watchful waiting.  Swelling: Has been out of his lisinopril for a while, for the past several weeks he's noted increasing swelling without chest pain, shortness of breath, orthopnea or PND.  Past medical history:  Negative.  See flowsheet/record as well for more information.  Surgical history: Negative.  See flowsheet/record as well for more information.  Family history: Negative.  See flowsheet/record as well for more information.  Social history: Negative.  See flowsheet/record as well for more information.  Allergies, and medications have been entered into the medical record, reviewed, and no changes needed.   Review of Systems: No fevers, chills, night sweats, weight loss, chest pain, or shortness of breath.   Objective:    General: Well Developed, well nourished, and in no acute distress.  Neuro: Alert and oriented x3, extra-ocular muscles intact, sensation grossly intact.  HEENT: Normocephalic, atraumatic, pupils equal round reactive to light, neck supple, no masses, no lymphadenopathy, thyroid nonpalpable. Swollen around the lower eyelids Skin: Warm and dry, no rashes. Cardiac: Regular rate and rhythm, no murmurs rubs or gallops, 3+ lower extreme symmetric pitting edema Respiratory: Clear to auscultation bilaterally. Not using accessory muscles, speaking in full sentences.  Impression and Recommendations:    S/P cervical spinal fusion He did see his neurosurgeon, he did have central cord syndrome, surgery was performed, neurosurgeon suspects that the persistent paresthesias will resolve or at least improve over the next 6 months. No further intervention, nerve conduction study was not done.  Hypertension Currently with significant anasarca. Restarting lisinopril, also up about 20 pounds, likely all fluid  retention. Asymptomatic, we are going to double his torsemide to 40 mg daily for the next week and a half. Atelectasis he him back in about a week and a half.  I spent 25 minutes with this patient, greater than 50% was face-to-face time counseling regarding the above diagnoses ___________________________________________ [name]. [name], M.D., ABFM., CAQSM. Primary Care and Sports Medicine [facility]  Adjunct Instructor of Family Medicine  [employer]

## 2016-10-26 NOTE — Assessment & Plan Note (Signed)
Currently with significant anasarca. Restarting lisinopril, also up about 20 pounds, likely all fluid retention. Asymptomatic, we are going to double his torsemide to 40 mg daily for the next week and a half. Atelectasis he him back in about a week and a half.

## 2016-10-26 NOTE — Assessment & Plan Note (Signed)
He did see his neurosurgeon, he did have central cord syndrome, surgery was performed, neurosurgeon suspects that the persistent paresthesias will resolve or at least improve over the next 6 months. No further intervention, nerve conduction study was not done.

## 2016-11-01 ENCOUNTER — Encounter: Payer: Self-pay | Admitting: Sports Medicine

## 2016-11-02 ENCOUNTER — Encounter: Payer: Self-pay | Admitting: Sports Medicine

## 2016-11-06 ENCOUNTER — Encounter: Payer: Self-pay | Admitting: Sports Medicine

## 2016-11-06 ENCOUNTER — Ambulatory Visit (INDEPENDENT_AMBULATORY_CARE_PROVIDER_SITE_OTHER): Payer: Medicare FFS | Admitting: Sports Medicine

## 2016-11-06 DIAGNOSIS — R600 Localized edema: Secondary | ICD-10-CM | POA: Diagnosis not present

## 2016-11-06 DIAGNOSIS — I10 Essential (primary) hypertension: Secondary | ICD-10-CM | POA: Diagnosis not present

## 2016-11-06 MED ORDER — TORSEMIDE 100 MG PO TABS: 100.0000 mg | ORAL_TABLET | Freq: Every day | ORAL | 0 refills | Status: DC

## 2016-11-06 MED ORDER — AMLODIPINE BESYLATE 10 MG PO TABS: 10.0000 mg | ORAL_TABLET | Freq: Every day | ORAL | 3 refills | Status: DC

## 2016-11-06 NOTE — Progress Notes (Signed)
  Subjective:    CC: Follow-up  HPI: Anasarca: Somehow didn't lose any weight with doubling torsemide for a week. Overall still asymptomatic, no shortness of breath, chest pain. No orthopnea or PND.  Past medical history:  Negative.  See flowsheet/record as well for more information.  Surgical history: Negative.  See flowsheet/record as well for more information.  Family history: Negative.  See flowsheet/record as well for more information.  Social history: Negative.  See flowsheet/record as well for more information.  Allergies, and medications have been entered into the medical record, reviewed, and no changes needed.   Review of Systems: No fevers, chills, night sweats, weight loss, chest pain, or shortness of breath.   Objective:    General: Well Developed, well nourished, and in no acute distress.  Neuro: Alert and oriented x3, extra-ocular muscles intact, sensation grossly intact.  HEENT: Normocephalic, atraumatic, pupils equal round reactive to light, neck supple, no masses, no lymphadenopathy, thyroid nonpalpable. Swelling of her face, eyelids Skin: Warm and dry, no rashes. Cardiac: Regular rate and rhythm, no murmurs rubs or gallops, 2+ bilateral pitting edema. Respiratory: Clear to auscultation bilaterally. Not using accessory muscles, speaking in full sentences.  Impression and Recommendations:    Lower extremity edema Currently with anasarca. We doubled his Demadex dose at the last visit. He actually gained weight somehow, at this point we are going to increase to 100 mg. He is going to measure his weight at home and let me know, and then check his weight again in one week on the same scale at home.  I spent 25 minutes with this patient, greater than 50% was face-to-face time counseling regarding the above diagnoses ___________________________________________ Steven Patton. [name], M.D., ABFM., CAQSM. Primary Care and Sports Medicine [facility]  [facility]  Adjunct Instructor of Family Medicine  [employer]

## 2016-11-06 NOTE — Assessment & Plan Note (Signed)
Currently with anasarca. We doubled his Demadex dose at the last visit. He actually gained weight somehow, at this point we are going to increase to 100 mg. He is going to measure his weight at home and let me know, and then check his weight again in one week on the same scale at home.

## 2016-11-07 ENCOUNTER — Other Ambulatory Visit: Payer: Self-pay | Admitting: Sports Medicine

## 2016-11-18 ENCOUNTER — Encounter: Payer: Self-pay | Admitting: Sports Medicine

## 2016-11-19 MED ORDER — CHLORTHALIDONE 50 MG PO TABS: 50.0000 mg | ORAL_TABLET | Freq: Every day | ORAL | 0 refills | Status: DC

## 2016-12-01 ENCOUNTER — Encounter: Payer: Self-pay | Admitting: Sports Medicine

## 2016-12-01 ENCOUNTER — Ambulatory Visit (INDEPENDENT_AMBULATORY_CARE_PROVIDER_SITE_OTHER): Payer: Medicare FFS | Admitting: Sports Medicine

## 2016-12-01 DIAGNOSIS — R601 Generalized edema: Secondary | ICD-10-CM | POA: Diagnosis not present

## 2016-12-01 NOTE — Progress Notes (Addendum)
  Subjective:    CC: Follow-up  HPI: This is a pleasant 81 year old male, we have been treating him for anasarca with occasional increased doses of Demadex.  Unfortunately he is noted continued worsening of his edema, weight, over 20-30 pounds now.  He denies any shortness of breath, chest pain, cough.  No oral or pharyngeal or mucosal symptoms.  Past medical history:  Negative.  See flowsheet/record as well for more information.  Surgical history: Negative.  See flowsheet/record as well for more information.  Family history: Negative.  See flowsheet/record as well for more information.  Social history: Negative.  See flowsheet/record as well for more information.  Allergies, and medications have been entered into the medical record, reviewed, and no changes needed.   Review of Systems: No fevers, chills, night sweats, weight loss, chest pain, or shortness of breath.   Objective:    General: Well Developed, well nourished, and in no acute distress.  Neuro: Alert and oriented x3, extra-ocular muscles intact, sensation grossly intact.  HEENT: Normocephalic, atraumatic, pupils equal round reactive to light, neck supple, no masses, no lymphadenopathy, thyroid nonpalpable.  Skin: Warm and dry, no rashes. Cardiac: Regular rate and rhythm, no murmurs rubs or gallops, 3+ lower extremity pitting edema, edema in the upper extremities and lower eyelids as well consistent with anasarca Respiratory: Clear to auscultation bilaterally. Not using accessory muscles, speaking in full sentences. Abdomen: Protuberant, anasarca.  We did have to remove his ring using 0 Prolene.  Impression and Recommendations:    Anasarca At this point it has not responded to multiple increased doses of a potent loop diuretic. Checking multiple labs looking for renal failure, liver failure, heart failure, hypothyroidism. Continue current dosages of diuretics. Further management will depend on lab results. We did have to  remove his ring today. Certainly we may need an abdominal ultrasound as well depending on what his liver function looks like.  TSH is the high limit of normal, adding T3 and T4.  TSH, T3, and T4 are normal, CBC, CMP, BNP, urinalysis, urine sodium and urine creatinine are also not remarkable, I would like nephrology to weigh in, referral placed.  I spent 25 minutes with this patient, greater than 50% was face-to-face time counseling regarding the above diagnoses ___________________________________________ Ihor Austinhomas J. Benjamin Stainhekkekandam, M.D., ABFM., CAQSM. Primary Care and Sports Medicine [facility]  Adjunct Instructor of Family Medicine  [employer]

## 2016-12-01 NOTE — Assessment & Plan Note (Addendum)
At this point it has not responded to multiple increased doses of a potent loop diuretic. Checking multiple labs looking for renal failure, liver failure, heart failure, hypothyroidism. Continue current dosages of diuretics. Further management will depend on lab results. We did have to remove his ring today. Certainly we may need an abdominal ultrasound as well depending on what his liver function looks like.  TSH is the high limit of normal, adding T3 and T4.  TSH, T3, and T4 are normal, CBC, CMP, BNP, urinalysis, urine sodium and urine creatinine are also not remarkable, I would like nephrology to weigh in, referral placed.

## 2016-12-02 LAB — COMPREHENSIVE METABOLIC PANEL
AG Ratio: 1.7 (calc) (ref 1.0–2.5)
AST: 16 U/L (ref 10–35)
Albumin: 4.3 g/dL (ref 3.6–5.1)
Alkaline phosphatase (APISO): 47 U/L (ref 40–115)
BUN: 23 mg/dL (ref 7–25)
Chloride: 102 mmol/L (ref 98–110)
Creat: 1.42 mg/dL — ABNORMAL HIGH (ref 0.70–1.18)
Globulin: 2.6 g/dL (calc) (ref 1.9–3.7)
Glucose, Bld: 119 mg/dL — ABNORMAL HIGH (ref 65–99)
Potassium: 4.2 mmol/L (ref 3.5–5.3)
Sodium: 137 mmol/L (ref 135–146)

## 2016-12-02 LAB — T4, FREE: Free T4: 1.3 ng/dL (ref 0.8–1.8)

## 2016-12-02 LAB — COMPREHENSIVE METABOLIC PANEL WITH GFR
ALT: 16 U/L (ref 9–46)
BUN/Creatinine Ratio: 16 (calc) (ref 6–22)
CO2: 24 mmol/L (ref 20–32)
Calcium: 9.8 mg/dL (ref 8.6–10.3)
Total Bilirubin: 0.7 mg/dL (ref 0.2–1.2)
Total Protein: 6.9 g/dL (ref 6.1–8.1)

## 2016-12-02 LAB — CBC
HCT: 40.3 % (ref 38.5–50.0)
Hemoglobin: 13.6 g/dL (ref 13.2–17.1)
MCH: 30.7 pg (ref 27.0–33.0)
MCHC: 33.7 g/dL (ref 32.0–36.0)
MCV: 91 fL (ref 80.0–100.0)
MPV: 9.9 fL (ref 7.5–12.5)
Platelets: 127 Thousand/uL — ABNORMAL LOW (ref 140–400)
RBC: 4.43 10*6/uL (ref 4.20–5.80)
RDW: 14.8 % (ref 11.0–15.0)
WBC: 9.2 10*3/uL (ref 3.8–10.8)

## 2016-12-02 LAB — URINALYSIS
Bilirubin Urine: NEGATIVE
Glucose, UA: NEGATIVE
Hgb urine dipstick: NEGATIVE
Ketones, ur: NEGATIVE
Leukocytes, UA: NEGATIVE
Nitrite: NEGATIVE
Protein, ur: NEGATIVE
Specific Gravity, Urine: 1.013 (ref 1.001–1.03)
pH: 7 (ref 5.0–8.0)

## 2016-12-02 LAB — BRAIN NATRIURETIC PEPTIDE: Brain Natriuretic Peptide: 106 pg/mL — ABNORMAL HIGH (ref <100)

## 2016-12-02 LAB — TEST AUTHORIZATION

## 2016-12-02 LAB — TSH: TSH: 4.31 mIU/L (ref 0.40–4.50)

## 2016-12-02 LAB — OSMOLALITY, URINE: Osmolality, Ur: 491 mosm/kg (ref 50–1200)

## 2016-12-02 LAB — OSMOLALITY: Osmolality: 292 mosm/kg (ref 278–305)

## 2016-12-02 LAB — SODIUM, URINE, RANDOM: Sodium, Ur: 82 mmol/L (ref 28–272)

## 2016-12-02 LAB — T3, FREE: T3, Free: 2.8 pg/mL (ref 2.3–4.2)

## 2016-12-03 ENCOUNTER — Other Ambulatory Visit: Payer: Self-pay | Admitting: Sports Medicine

## 2016-12-03 NOTE — Addendum Note (Signed)
Addended by: Monica BectonHEKKEKANDAM, Naryiah Schley J on: 12/03/2016 11:34 AM   Modules accepted: Orders

## 2016-12-07 LAB — TESTOSTERONE, FREE & TOTAL

## 2016-12-09 ENCOUNTER — Encounter: Payer: Self-pay | Admitting: Sports Medicine

## 2016-12-10 ENCOUNTER — Other Ambulatory Visit: Payer: Self-pay | Admitting: Sports Medicine

## 2016-12-10 DIAGNOSIS — R600 Localized edema: Secondary | ICD-10-CM

## 2016-12-11 ENCOUNTER — Other Ambulatory Visit: Payer: Self-pay | Admitting: Sports Medicine

## 2016-12-14 ENCOUNTER — Other Ambulatory Visit: Payer: Self-pay | Admitting: Sports Medicine

## 2016-12-21 ENCOUNTER — Other Ambulatory Visit: Payer: Self-pay | Admitting: Sports Medicine

## 2016-12-21 DIAGNOSIS — R600 Localized edema: Secondary | ICD-10-CM

## 2016-12-28 ENCOUNTER — Ambulatory Visit: Payer: Medicare FFS | Admitting: Sports Medicine

## 2016-12-28 ENCOUNTER — Encounter: Payer: Self-pay | Admitting: Sports Medicine

## 2016-12-28 DIAGNOSIS — R601 Generalized edema: Secondary | ICD-10-CM

## 2016-12-28 DIAGNOSIS — R600 Localized edema: Secondary | ICD-10-CM

## 2016-12-28 MED ORDER — TORSEMIDE 100 MG PO TABS: 100.0000 mg | ORAL_TABLET | Freq: Every day | ORAL | 0 refills | Status: DC

## 2016-12-28 NOTE — Patient Instructions (Signed)
Appointment with the nephrologist Monday, December 3 at 8:15 in the morning.  [facility] [address] [place] [id number] Mady HaagensenMunsoor Lateef, MD

## 2016-12-28 NOTE — Progress Notes (Signed)
  Subjective:    CC: Anasarca  HPI: This is a pleasant 81 year old male, over the past several weeks he is developed increasing swelling of his face, arms, abdomen and lower extremities, we have done an extensive workup without many results, he did have some mild renal insufficiency better than on previous checks.  We started high-dose torsemide, he improved considerably with about a 10 pound weight loss but unfortunately is having a recurrence of swelling.  He denies any shortness of breath, no cough, no other new medications, we have checked a large panel of blood work, please see below in the assessment and plan for details.  I referred him to local nephrology for assistance in management, no appointments are available anytime soon so he is agreeable to touch base with a different nephrologist.  Past medical history:  Negative.  See flowsheet/record as well for more information.  Surgical history: Negative.  See flowsheet/record as well for more information.  Family history: Negative.  See flowsheet/record as well for more information.  Social history: Negative.  See flowsheet/record as well for more information.  Allergies, and medications have been entered into the medical record, reviewed, and no changes needed.   Review of Systems: No fevers, chills, night sweats, weight loss, chest pain, or shortness of breath.   Objective:    General: Well Developed, well nourished, and in no acute distress.  Face looks swollen. Neuro: Alert and oriented x3, extra-ocular muscles intact, sensation grossly intact.  HEENT: Normocephalic, atraumatic, pupils equal round reactive to light, neck supple, no masses, no lymphadenopathy, thyroid nonpalpable.  Skin: Warm and dry, no rashes. Cardiac: Regular rate and rhythm, no murmurs rubs or gallops, 3+ lower extremity symmetric pitting edema, abdominal protuberance, upper extremity edema, edema around the eyelids. Respiratory: Clear to auscultation bilaterally.  Not using accessory muscles, speaking in full sentences.   Impression and Recommendations:    Anasarca Unfortunately persistence of anasarca, he has been on several increased doses of torsemide. I am going to increase him to 100 mg today, initially his weight showed a good drop but has increased considerably. Multiple labs were negative, these will be included, his BNP was slightly elevated at 106, he did have an echocardiogram last year that showed an ejection fraction of 65%. Protein, albumin, liver function was normal just a few weeks ago when this started, renal function is the best it has looked, he does have mild renal insufficiency. Fractional excretion of sodium was within normal limits. Urine and serum osmolality were within normal limits. No evidence of proteinuria on urine dipstick. Normal thyroid function. Rechecking labs today.  The next nephrology appointment nearby is over a month, I am going to see if Dr. Cherylann RatelLateef in [place] can get him in sooner.  I spent 25 minutes with this patient, greater than 50% was face-to-face time counseling regarding the above diagnoses ___________________________________________ Steven Patton J. Benjamin Stainhekkekandam, M.D., ABFM., CAQSM. Primary Care and Sports Medicine [facility]  Adjunct Instructor of Family Medicine  [employer]

## 2016-12-28 NOTE — Assessment & Plan Note (Signed)
Unfortunately persistence of anasarca, he has been on several increased doses of torsemide. I am going to increase him to 100 mg today, initially his weight showed a good drop but has increased considerably. Multiple labs were negative, these will be included, his BNP was slightly elevated at 106, he did have an echocardiogram last year that showed an ejection fraction of 65%. Protein, albumin, liver function was normal just a few weeks ago when this started, renal function is the best it has looked, he does have mild renal insufficiency. Fractional excretion of sodium was within normal limits. Urine and serum osmolality were within normal limits. No evidence of proteinuria on urine dipstick. Normal thyroid function. Rechecking labs today.  The next nephrology appointment nearby is over a month, I am going to see if Dr. Cherylann RatelLateef in [place] can get him in sooner.

## 2016-12-29 LAB — URINALYSIS
Bilirubin Urine: NEGATIVE
Glucose, UA: NEGATIVE
Hgb urine dipstick: NEGATIVE
Ketones, ur: NEGATIVE
Leukocytes, UA: NEGATIVE
Nitrite: NEGATIVE
Protein, ur: NEGATIVE
Specific Gravity, Urine: 1.009 (ref 1.001–1.03)
pH: 6.5 (ref 5.0–8.0)

## 2016-12-29 LAB — COMPREHENSIVE METABOLIC PANEL WITH GFR
ALT: 12 U/L (ref 9–46)
AST: 13 U/L (ref 10–35)
BUN/Creatinine Ratio: 14 (calc) (ref 6–22)
BUN: 24 mg/dL (ref 7–25)
CO2: 29 mmol/L (ref 20–32)
Creat: 1.68 mg/dL — ABNORMAL HIGH (ref 0.70–1.18)
Glucose, Bld: 117 mg/dL — ABNORMAL HIGH (ref 65–99)

## 2016-12-29 LAB — COMPREHENSIVE METABOLIC PANEL
AG Ratio: 1.6 (calc) (ref 1.0–2.5)
Albumin: 4.3 g/dL (ref 3.6–5.1)
Alkaline phosphatase (APISO): 42 U/L (ref 40–115)
Calcium: 9 mg/dL (ref 8.6–10.3)
Chloride: 104 mmol/L (ref 98–110)
Globulin: 2.7 g/dL (calc) (ref 1.9–3.7)
Potassium: 4.1 mmol/L (ref 3.5–5.3)
Sodium: 142 mmol/L (ref 135–146)
Total Bilirubin: 0.6 mg/dL (ref 0.2–1.2)
Total Protein: 7 g/dL (ref 6.1–8.1)

## 2016-12-29 LAB — CBC
HCT: 39.4 % (ref 38.5–50.0)
Hemoglobin: 13.5 g/dL (ref 13.2–17.1)
MCH: 30.8 pg (ref 27.0–33.0)
MCHC: 34.3 g/dL (ref 32.0–36.0)
MCV: 90 fL (ref 80.0–100.0)
MPV: 10.1 fL (ref 7.5–12.5)
Platelets: 127 Thousand/uL — ABNORMAL LOW (ref 140–400)
RBC: 4.38 10*6/uL (ref 4.20–5.80)
RDW: 13.6 % (ref 11.0–15.0)
WBC: 10.2 10*3/uL (ref 3.8–10.8)

## 2016-12-29 LAB — BRAIN NATRIURETIC PEPTIDE: Brain Natriuretic Peptide: 114 pg/mL — ABNORMAL HIGH (ref <100)

## 2017-01-01 ENCOUNTER — Other Ambulatory Visit: Payer: Self-pay | Admitting: Sports Medicine

## 2017-01-01 DIAGNOSIS — R600 Localized edema: Secondary | ICD-10-CM

## 2017-01-22 ENCOUNTER — Telehealth: Payer: Self-pay

## 2017-01-22 DIAGNOSIS — R601 Generalized edema: Secondary | ICD-10-CM

## 2017-01-22 NOTE — Telephone Encounter (Signed)
Pt is still having swelling and was told he should have a follow up with a heart doctor as well as get info from kidney doctor. Pt has not heard anything and would like to know what to do next. Please advise.

## 2017-01-22 NOTE — Telephone Encounter (Signed)
I spoke to the nephrologist, he had suggested that labs were normal and we should continue the current dosing of Demadex.  I am happy to do a referral to cardiology as well.

## 2017-03-15 ENCOUNTER — Ambulatory Visit: Payer: Medicare FFS | Admitting: Adult Health

## 2017-03-15 ENCOUNTER — Other Ambulatory Visit: Payer: Self-pay | Admitting: *Deleted

## 2017-03-15 ENCOUNTER — Encounter: Payer: Self-pay | Admitting: Adult Health

## 2017-03-15 VITALS — BP 132/58 | HR 56 | Ht 69.0 in | Wt 344.0 lb

## 2017-03-15 DIAGNOSIS — Z79899 Other long term (current) drug therapy: Secondary | ICD-10-CM

## 2017-03-15 DIAGNOSIS — I071 Rheumatic tricuspid insufficiency: Secondary | ICD-10-CM | POA: Diagnosis not present

## 2017-03-15 DIAGNOSIS — I5030 Unspecified diastolic (congestive) heart failure: Secondary | ICD-10-CM | POA: Diagnosis not present

## 2017-03-15 DIAGNOSIS — N289 Disorder of kidney and ureter, unspecified: Secondary | ICD-10-CM

## 2017-03-15 MED ORDER — METOLAZONE 2.5 MG PO TABS: 2.5000 mg | ORAL_TABLET | Freq: Every day | ORAL | 0 refills | Status: DC

## 2017-03-15 NOTE — Patient Instructions (Addendum)
Medication Instructions:  TAKE METOLAZONE 2.5MG  Take 1/2 hour before torsemide-Take one tablet Tuesday(tomorrow and [facility] if still swollen, PLEASE CALL IF THIS IS NOT WORKING  MAKE SURE TO TAKE TORSEMIDE 1/2 HOUR AFTER METOLAZONE  If you need a refill on your cardiac medications before your next appointment, please call your pharmacy.  Labwork: BMET IN ~10 DAYS HERE IN OUR OFFICE AT [facility]  Take the provided lab slips for you to take with you to the lab for you blood draw.   You will NOT need to fast   You may go to any [facility] lab that is convenient for you however, we do have a lab in our office that is able to assist you. You do NOT need an appointment for our lab. Once in our office lobby there is a podium to the right of the check-in desk where you are to sign-in and ring a doorbell to alert us you are here. Lab is open Monday-Friday from 8:00am to 4:00pm; and is closed for lunch from 12:45p-1:45pm   Testing/Procedures: Echocardiogram - Your physician has requested that you have an echocardiogram. Echocardiography is a painless test that uses sound waves to create images of your heart. It provides your doctor with information about the size and shape of your heart and how well your heart's chambers and valves are working. This procedure takes approximately one hour. There are no restrictions for this procedure. This [facility] be performed at [facility] - [address], [address].  Your physician has requested that you have a RENAL ULTRA SOUND-FOR NEPHROLOGIST  Follow-Up: Your physician wants you to follow-up in: 2 WEEKS WITH DR Jens SomRENSHAW -OR- Joni ReiningKATHRYN LAWRENCE, DNP.    Thank you for choosing [facility]!!

## 2017-03-15 NOTE — Progress Notes (Signed)
Cardiology Office Note   Date:  03/15/2017   ID:  Steven Patton, DOB 12/21/1935, MRN [id number]  PCP:  Monica Bectonhekkekandam, Thomas J, MD  Cardiologist:  Dr. Jens Somrenshaw Chief Complaint  Patient presents with  . Follow-up  . Shortness of Breath  . Edema     History of Present Illness: Steven CamelHoyle Chevez is a 82 y.o. male who presents for ongoing assessment and management of hypertension. Most recent cardiac catheterization 06/10/2012 revealed normal coronary arteries ejection fraction 60% with no wall motion abnormalities. His last seen by Dr. Jens Somrenshaw on 07/27/2012. He comes today after not being seen almost 5 years now, with complaints of swelling. Is followed by nephrology and remains on Demadex.  Date he has gained approximately 40 pound August 2018. He is noted worsening lower extremity edema, abdominal pain, PND, and orthopnea. The patient states walking to the bathroom, dyspnea. He is noted that I are swollen abdomen he has been medically compliant torsemide 100 mg daily, but has gone up on the dose to 200 mg one morning when the ED to help to reduce fluid. He states it helped a little, but not significantly. The patient does not eat any salt laden foods.  He has been seen by nephrology who is requesting a renal ultrasound to evaluate kidney function. His most recent creatinine was 1.60 in November. He is undergone physical rehabilitation after back surgery, and walking in all with exception of going to the bathroom, and remains in a wheelchair significant amount of time. He also has his legs dependent position most day.  Past Medical History:  Diagnosis Date  . Arthritis   . Cataract   . Hypertension    takes Amlodipine,Lisinopril,and Metoprolol daily  . Macular degeneration, left eye   . Sleep apnea    2008.Marland Kitchen.Marland Kitchen.WEARS CPAP    Past Surgical History:  Procedure Laterality Date  . BACK SURGERY    . CARDIAC CATHETERIZATION  06-10-12  . COLONOSCOPY    . EYE SURGERY     CATARACT RIGHT EYE  . KNEE  SURGERY    . LUMBAR LAMINECTOMY/DECOMPRESSION MICRODISCECTOMY N/A 11/02/2012   Procedure: LUMBAR LAMINECTOMY/DECOMPRESSION MICRODISCECTOMY 2 LEVEL;  Surgeon: Emilee HeroMark Leonard Dumonski, MD;  [facility];  Service: Orthopedics;  Laterality: N/A;  Lumbar 4-5,lumbar 5-sacrum 1 decompression  . TONSILLECTOMY    . TOTAL HIP ARTHROPLASTY Left 01/06/2013   Procedure: LEFT TOTAL HIP ARTHROPLASTY-Posterior approach;  Surgeon: Harvie JuniorJohn L Graves, MD;  [facility];  Service: Orthopedics;  Laterality: Left;  . TOTAL HIP ARTHROPLASTY Right 04/21/2013   Procedure: TOTAL HIP ARTHROPLASTY ANTERIOR APPROACH;  Surgeon: Harvie JuniorJohn L Graves, MD;  Location: [facility] OR;  Service: Orthopedics;  Laterality: Right;  . TOTAL KNEE ARTHROPLASTY Left 09/16/2012   Procedure: TOTAL KNEE ARTHROPLASTY;  Surgeon: Harvie JuniorJohn L Graves, MD;  Location: [facility] OR;  Service: Orthopedics;  Laterality: Left;     Current Outpatient Medications  Medication Sig Dispense Refill  . allopurinol (ZYLOPRIM) 300 MG tablet Take 1 tablet (300 mg total) by mouth 2 (two) times daily. 180 tablet 1  . AMBULATORY NON FORMULARY MEDICATION Knee-high, firm compression, custom made, graduated compression stockings. Apply to lower extremities. 1 each prn  . amLODipine (NORVASC) 10 MG tablet Take 1 tablet (10 mg total) by mouth daily. 90 tablet 3  . aspirin 81 MG tablet Take 1 tablet (81 mg total) by mouth daily. 90 tablet 3  . atorvastatin (LIPITOR) 40 MG tablet TAKE 1 TABLET (40 MG TOTAL) BY MOUTH DAILY AT 6 PM.  90 tablet 3  .  carvedilol (COREG) 25 MG tablet TAKE 1 TABLET TWICE DAILY WITH A MEAL 180 tablet 3  . gabapentin (NEURONTIN) 600 MG tablet TAKE 1 TABLET THREE TIMES DAILY 270 tablet 3  . lisinopril (PRINIVIL,ZESTRIL) 10 MG tablet TAKE 1 TABLET BY MOUTH EVERY DAY 14 tablet 0  . meloxicam (MOBIC) 15 MG tablet TAKE 1 TABLET EVERY DAY 90 tablet 3  . torsemide (DEMADEX) 100 MG tablet Take 1 tablet (100 mg total) by mouth daily. 30 tablet 0  . torsemide (DEMADEX) 100 MG  tablet TAKE 1 TABLET(100 MG) BY MOUTH DAILY 14 tablet 0  . metolazone (ZAROXOLYN) 2.5 MG tablet Take 1 tablet (2.5 mg total) by mouth daily. Take 1/2 hour before torsemide-Take one tablet Tuesday(tomorrow and again on Thursday if still swollen 5 tablet 0  . nitroGLYCERIN (NITROSTAT) 0.4 MG SL tablet Place under the tongue. Reported on 04/08/2015     No current facility-administered medications for this visit.     Allergies:   Patient has no known allergies.    Social History:  The patient  reports that he quit smoking about 24 years ago. His smoking use included cigarettes. He has a 20.00 pack-year smoking history. He quit smokeless tobacco use about 24 years ago. His smokeless tobacco use included chew. He reports that he does not drink alcohol or use drugs.   Family History:  The patient's family history includes Alzheimer's disease in his mother; Cancer in his daughter; Lung cancer in his father.    ROS: All other systems are reviewed and negative. Unless otherwise mentioned in H&P    PHYSICAL EXAM: VS:  BP (!) 132/58   Pulse (!) 56   Ht 5\' 9"  (1.753 m)   Wt (!) 344 lb (156 kg)   BMI 50.80 kg/m  , BMI Body mass index is 50.8 kg/m. GEN: Well nourished, well developed, in no acute distress  HEENT: normal  Neck: no JVD, carotid bruits, or masses Cardiac: RRR; 1/6 systolic murmur, best heard left sternal border the fourth intercostal space, no rubs, or gallops,no edema  Respiratory:  Clear to auscultation bilaterally, normal work of breathing, while sitting in wheelchair. GI: soft, nontender, nondistended, + BS MS: no deformity or atrophy the patient has lower extremity TED hose bilaterally with 1+ edema noted by him. This is occurring up to his knee. Skin: warm and dry, no rash Neuro:  Strength and sensation are intact Psych: euthymic mood, full affect   EKG:  Sinus bradycardia with first-degree AV block and PACs, he does have left and deviation ER interval 0.21 ms, QT 0.436.  Heart rate 56 bpm.  Recent Labs: 12/01/2016: TSH 4.31 12/28/2016: ALT 12; Brain Natriuretic Peptide 114; BUN 24; Creat 1.68; Hemoglobin 13.5; Platelets 127; Potassium 4.1; Sodium 142    Lipid Panel    Component Value Date/Time   CHOL 168 09/28/2016 1410   TRIG 220 (H) 09/28/2016 1410   HDL 31 (L) 09/28/2016 1410   CHOLHDL 5.4 (H) 09/28/2016 1410   VLDL 35 04/05/2014 1030   LDLCALC 109 (H) 04/05/2014 1030      Wt Readings from Last 3 Encounters:  03/15/17 (!) 344 lb (156 kg)  12/28/16 (!) 338 lb 12 oz (153.7 kg)  12/01/16 (!) 342 lb 1.9 oz (155.2 kg)     ASSESSMENT AND PLAN:  1.  Acute on chronic diastolic CHF: Abdomen is mildly distended with 1+ to 2+ pitting edema bilaterally lower therapy patient is symptomatic with PND and orthopnea. Plan will be to change to torsemide (  taken on an empty stomach waiting 30 minutes to the, for better bioavailability. I've also prescribed metolazone 2.5 mg which she can take tomorrow admitted 3 days later take another dose if he has not had a good result initial dose, with weight loss and resolution of edema.  2. Morbid obesity: The patient is very sedentary had back surgery. He did undergo physical therapy, but has become wheelchair and bed. Patient feels tired most of the time, I would like to see become more active when the fluid had been removed. It is my hope that physical therapy will help him gain more independent and lose some weight.  3. Hypertension: Blood pressure is currently controlled. He is on amlodipine which can cause dependent edema. We'll review further when he seen again. I will repeat his echocardiogram evaluation of LV function.  4. Chronic kidney disease stage III-IV: Creatinine clearance of 52 mL/m.  Current medicines are reviewed at length with the patient today.    Labs/ tests ordered today include: Echocardiogram, renal ultrasound, BMET Bettey Mare. Steven Patton, ANP, AACC   03/15/2017 4:04 PM    [facility] [address], [place], [place] [address] Phone: [phone]; Fax: [phone]

## 2017-03-16 ENCOUNTER — Telehealth: Payer: Self-pay | Admitting: Adult Health

## 2017-03-16 ENCOUNTER — Other Ambulatory Visit: Payer: Self-pay | Admitting: *Deleted

## 2017-03-16 MED ORDER — METOLAZONE 2.5 MG PO TABS: 2.5000 mg | ORAL_TABLET | Freq: Every day | ORAL | 0 refills | Status: DC

## 2017-03-16 NOTE — Telephone Encounter (Signed)
Spoke with pt, metolazone script sent to [facility] instead of [facility]

## 2017-03-16 NOTE — Telephone Encounter (Signed)
Pt called about his medication not being at the Pharm, he would like a call back.

## 2017-03-17 ENCOUNTER — Ambulatory Visit (HOSPITAL_COMMUNITY)
Admission: RE | Admit: 2017-03-17 | Discharge: 2017-03-17 | Disposition: A | Discharge status: Home / Self Care | Payer: Medicare FFS | Source: Ambulatory Visit | Attending: Adult Health | Admitting: Adult Health

## 2017-03-17 DIAGNOSIS — N289 Disorder of kidney and ureter, unspecified: Secondary | ICD-10-CM | POA: Diagnosis not present

## 2017-03-22 ENCOUNTER — Ambulatory Visit (HOSPITAL_COMMUNITY): Payer: Medicare FFS | Attending: Adult Health

## 2017-03-22 ENCOUNTER — Other Ambulatory Visit: Payer: Self-pay

## 2017-03-22 DIAGNOSIS — I071 Rheumatic tricuspid insufficiency: Secondary | ICD-10-CM | POA: Diagnosis not present

## 2017-03-22 DIAGNOSIS — I5030 Unspecified diastolic (congestive) heart failure: Secondary | ICD-10-CM | POA: Insufficient documentation

## 2017-03-22 DIAGNOSIS — E669 Obesity, unspecified: Secondary | ICD-10-CM | POA: Insufficient documentation

## 2017-03-22 DIAGNOSIS — G4730 Sleep apnea, unspecified: Secondary | ICD-10-CM | POA: Diagnosis not present

## 2017-03-22 DIAGNOSIS — I110 Hypertensive heart disease with heart failure: Secondary | ICD-10-CM | POA: Insufficient documentation

## 2017-03-22 DIAGNOSIS — Z6841 Body Mass Index (BMI) 40.0 and over, adult: Secondary | ICD-10-CM | POA: Diagnosis not present

## 2017-03-25 ENCOUNTER — Other Ambulatory Visit: Payer: Self-pay | Admitting: *Deleted

## 2017-03-25 MED ORDER — METOLAZONE 2.5 MG PO TABS: 2.5000 mg | ORAL_TABLET | Freq: Every day | ORAL | 5 refills | Status: DC

## 2017-03-26 LAB — BASIC METABOLIC PANEL
BUN/Creatinine Ratio: 26 — ABNORMAL HIGH (ref 10–24)
BUN: 49 mg/dL — AB (ref 8–27)
CALCIUM: 9.6 mg/dL (ref 8.6–10.2)
CO2: 27 mmol/L (ref 20–29)
Chloride: 92 mmol/L — ABNORMAL LOW (ref 96–106)
Creatinine, Ser: 1.89 mg/dL — ABNORMAL HIGH (ref 0.76–1.27)
GFR calc Af Amer: 38 mL/min/{1.73_m2} — ABNORMAL LOW (ref >59)
GFR, EST NON AFRICAN AMERICAN: 33 mL/min/{1.73_m2} — AB (ref >59)
Glucose: 122 mg/dL — ABNORMAL HIGH (ref 65–99)
POTASSIUM: 3.5 mmol/L (ref 3.5–5.2)
Sodium: 139 mmol/L (ref 134–144)

## 2017-03-28 NOTE — Progress Notes (Signed)
Cardiology Office Note   Date:  03/29/2017   ID:  Steven Patton, DOB 02-17-1935, MRN [id number]  PCP:  Monica Becton, MD  Cardiologist:  [facility]  Chief Complaint  Patient presents with  . Follow-up     History of Present Illness: Steven Patton is a 82 y.o. male who presents for ongoing assessment and management of hypertension.  The patient had gained approximately 40 pounds with worsening lower extremity edema, abdominal pain, PND and orthopnea.  He had had a catheterization in May 2014 revealing normal coronary arteries with an EF of 60%.  He is also being followed by nephrology who had requested a renal ultrasound to evaluate kidney function.  He is also undergoing physical therapy after having back surgery.  He was very sedentary.  The patient was changed from Lasix to torsemide, he was to take it on empty stomach waiting 30 minutes for better bioavailability.  He was started on metolazone which he was to take the following day, wait 2 days and take again if he was did not have enough diuresis.  Repeat labs were completed on 03/25/2017, sodium 139, potassium 3.5, chloride 92, CO2 27, BUN 49, creatinine 1.89.  Renal ultrasound was completed on 03/17/2017 with unremarkable ultrasound, no hydronephrosis, no lesions, with normal renal cortical thickness bilaterally.  He comes today feeling much better, he denies any recurrent significant lower extremity edema or decreased breathing. The patient's echocardiogram was reviewed with the patient along with renal ultrasound results.  He would like to follow-up with a more local nephrologist, most recent nephrologist was seen in [place], [place], which is very apical for him to attend as zero one daily morning. Lives in [place].  Past Medical History:  Diagnosis Date  . Arthritis   . Cataract   . Hypertension    takes Amlodipine,Lisinopril,and Metoprolol daily  . Macular degeneration, left eye   . Sleep apnea    2008.Marland KitchenMarland KitchenWEARS CPAP    Past Surgical History:  Procedure Laterality Date  . BACK SURGERY    . CARDIAC CATHETERIZATION  06-10-12  . COLONOSCOPY    . EYE SURGERY     CATARACT RIGHT EYE  . KNEE SURGERY    . LUMBAR LAMINECTOMY/DECOMPRESSION MICRODISCECTOMY N/A 11/02/2012   Procedure: LUMBAR LAMINECTOMY/DECOMPRESSION MICRODISCECTOMY 2 LEVEL;  Surgeon: Emilee Hero, MD;  [facility];  Service: Orthopedics;  Laterality: N/A;  Lumbar 4-5,lumbar 5-sacrum 1 decompression  . TONSILLECTOMY    . TOTAL HIP ARTHROPLASTY Left 01/06/2013   Procedure: LEFT TOTAL HIP ARTHROPLASTY-Posterior approach;  Surgeon: Harvie Junior, MD;  Location: [facility] OR;  Service: Orthopedics;  Laterality: Left;  . TOTAL HIP ARTHROPLASTY Right 04/21/2013   Procedure: TOTAL HIP ARTHROPLASTY ANTERIOR APPROACH;  Surgeon: Harvie Junior, MD;  Location: [facility] OR;  Service: Orthopedics;  Laterality: Right;  . TOTAL KNEE ARTHROPLASTY Left 09/16/2012   Procedure: TOTAL KNEE ARTHROPLASTY;  Surgeon: Harvie Junior, MD;  Location: [facility] OR;  Service: Orthopedics;  Laterality: Left;     Current Outpatient Medications  Medication Sig Dispense Refill  . allopurinol (ZYLOPRIM) 300 MG tablet Take 1 tablet (300 mg total) by mouth 2 (two) times daily. 180 tablet 1  . AMBULATORY NON FORMULARY MEDICATION Knee-high, firm compression, custom made, graduated compression stockings. Apply to lower extremities. 1 each prn  . amLODipine (NORVASC) 10 MG tablet Take 1 tablet (10 mg total) by mouth daily. 90 tablet 3  . aspirin 81 MG tablet Take 1 tablet (81 mg total) by mouth daily. 90 tablet 3  .  atorvastatin (LIPITOR) 40 MG tablet TAKE 1 TABLET (40 MG TOTAL) BY MOUTH DAILY AT 6 PM.  90 tablet 3  . carvedilol (COREG) 25 MG tablet TAKE 1 TABLET TWICE DAILY WITH A MEAL 180 tablet 3  . gabapentin (NEURONTIN) 600 MG tablet TAKE 1 TABLET THREE TIMES DAILY 270 tablet 3  . lisinopril (PRINIVIL,ZESTRIL) 10 MG tablet TAKE 1 TABLET BY MOUTH EVERY DAY 14 tablet 0  .  meloxicam (MOBIC) 15 MG tablet TAKE 1 TABLET EVERY DAY 90 tablet 3  . metolazone (ZAROXOLYN) 2.5 MG tablet Take 1 tablet (2.5 mg total) by mouth daily. Take 1/2 hour before torsemide-Take one tablet Tuesday(tomorrow and again on Thursday if still swollen 30 tablet 5  . torsemide (DEMADEX) 100 MG tablet Take 1 tablet (100 mg total) by mouth daily. (Patient taking differently: Take 20 mg by mouth daily. ) 30 tablet 0  . nitroGLYCERIN (NITROSTAT) 0.4 MG SL tablet Place under the tongue. Reported on 04/08/2015    . torsemide (DEMADEX) 100 MG tablet TAKE 1 TABLET(100 MG) BY MOUTH DAILY 14 tablet 0   No current facility-administered medications for this visit.     Allergies:   Patient has no known allergies.    Social History:  The patient  reports that he quit smoking about 24 years ago. His smoking use included cigarettes. He has a 20.00 pack-year smoking history. He quit smokeless tobacco use about 24 years ago. His smokeless tobacco use included chew. He reports that he does not drink alcohol or use drugs.   Family History:  The patient's family history includes Alzheimer's disease in his mother; Cancer in his daughter; Lung cancer in his father.    ROS: All other systems are reviewed and negative. Unless otherwise mentioned in H&P    PHYSICAL EXAM: VS:  BP (!) 136/58   Pulse 63   Ht 5\' 9"  (1.753 m)   Wt (!) 343 lb (155.6 kg)   SpO2 95%   BMI 50.65 kg/m  , BMI Body mass index is 50.65 kg/m. GEN: Well nourished, well developed, in no acute distress  HEENT: normal  Neck: no JVD, carotid bruits, or masses Cardiac: RRR; no murmurs, rubs, or gallops, 1+ to 2+ dependent pretibial and ankle edema  Respiratory:  Clear to auscultation bilaterally, normal work of breathing GI: soft, nontender, nondistended, + BS MS: no deformity or atrophy  Skin: warm and dry, no rash Neuro:  Strength significantly reduced, he is walker and wheelchair dependent,  sensation is intact Psych: euthymic mood, full  affect. Easily tearful when talked he dependent on her daughter for transportation physician appointments.    Recent Labs: 12/01/2016: TSH 4.31 12/28/2016: ALT 12; Brain Natriuretic Peptide 114; Hemoglobin 13.5; Platelets 127 03/25/2017: BUN 49; Creatinine, Ser 1.89; Potassium 3.5; Sodium 139    Lipid Panel    Component Value Date/Time   CHOL 168 09/28/2016 1410   TRIG 220 (H) 09/28/2016 1410   HDL 31 (L) 09/28/2016 1410   CHOLHDL 5.4 (H) 09/28/2016 1410   VLDL 35 04/05/2014 1030   LDLCALC 109 (H) 04/05/2014 1030      Wt Readings from Last 3 Encounters:  03/29/17 (!) 343 lb (155.6 kg)  03/15/17 (!) 344 lb (156 kg)  12/28/16 (!) 338 lb 12 oz (153.7 kg)      Other studies Reviewed: Echocardiogram 2017-03-27 Left ventricle: The cavity size was normal. There was mild   concentric hypertrophy. Systolic function was normal. The   estimated ejection fraction was in the range of  60% to 65%. Wall   motion was normal; there were no regional wall motion   abnormalities. Features are consistent with a pseudonormal left   ventricular filling pattern, with concomitant abnormal relaxation   and increased filling pressure (grade 2 diastolic dysfunction). - Mitral valve: Calcified annulus. - Left atrium: The atrium was severely dilated. - Right atrium: The atrium was mildly dilated.  Labs: Dated 03/25/2017: Sodium 139, potassium 3.5, chloride 92, CO2 27, glucose 122, BUN 49, creatinine 1.89, GFR 33.  ASSESSMENT AND PLAN:  1. Chronic diastolic heart failure: Echocardiogram revealing grade 2 diastolic dysfunction. He is advised to take torsemide 40 mg once a day, alternating with 20 mg every other day. He is to continue to take this with potassium. He is advised to take it on an empty stomach 30 minutes before allowing himself to eat.  Creatinine is rising to 1.89. He was given one dose of metolazone prior to this office appointment. With increased creatinine at 1.89 and decreased GFR 33.  His weight remains essentially the same chronic lower extremity edema. I see that this is likely from dependent edema as he does not have edema abdomen, or dyspnea or PND despite obesity.  He is advised to keep feet elevated when any prolonged periods of time. He is wearing compression hose.  2. Hypertension: Due to worsening kidney function GFR, I'm going to discontinue lisinopril and in hydralazine 25 mg twice a day instead.  3. Stage I chronic kidney decreasing GFR.: The patient requests referral to normal local nephrologist. I have offered referral to [facility] as he lives in [place]. He [place] to have [place] neph[place] in [place] [facility] with [facility]. He is being referred to nephrologist [facility] with [facility].  4. Morbid obesity: He is very sedentary secondary to back surgery and significant deconditioning. Advised to take a physical therapy he does have the therapist coming his home, but he feels he is maximum level of activity currently.   Current medicines are reviewed at length with the patient today.    Labs/ tests ordered today include:  Bettey MareKathryn M. Liborio NixonLawrence DNP, ANP, AACC   03/29/2017 3:28 PM    [facility] [address], [place], [place] [address] Phone: [phone]; Fax: [phone]

## 2017-03-29 ENCOUNTER — Ambulatory Visit: Payer: Medicare FFS | Admitting: Adult Health

## 2017-03-29 ENCOUNTER — Encounter: Payer: Self-pay | Admitting: Adult Health

## 2017-03-29 VITALS — BP 136/58 | HR 63 | Ht 69.0 in | Wt 343.0 lb

## 2017-03-29 DIAGNOSIS — I5032 Chronic diastolic (congestive) heart failure: Secondary | ICD-10-CM | POA: Diagnosis not present

## 2017-03-29 DIAGNOSIS — R601 Generalized edema: Secondary | ICD-10-CM

## 2017-03-29 DIAGNOSIS — R600 Localized edema: Secondary | ICD-10-CM | POA: Diagnosis not present

## 2017-03-29 DIAGNOSIS — N289 Disorder of kidney and ureter, unspecified: Secondary | ICD-10-CM | POA: Diagnosis not present

## 2017-03-29 MED ORDER — HYDRALAZINE HCL 25 MG PO TABS: 25.0000 mg | ORAL_TABLET | Freq: Two times a day (BID) | ORAL | 3 refills | Status: DC

## 2017-03-29 MED ORDER — TORSEMIDE 20 MG PO TABS: 40.0000 mg | ORAL_TABLET | Freq: Two times a day (BID) | ORAL | 3 refills | Status: DC

## 2017-03-29 NOTE — Patient Instructions (Signed)
Medication Instructions:  STOP LISINOPRIL AND METOLAZONE  INCREASE TORSEMIDE 40MG  DAILY  HYDRALAZINE 25MG  TWICE DAILY  If you need a refill on your cardiac medications before your next appointment, please call your pharmacy.  Special Instructions: REFERRAL TO NEPHROLOGY-WILL CALL BACK WITH THIS NAME OF NEPHROLOGIST  Follow-Up: Your physician wants you to follow-up in: 1 MONTH WITH KATHRYN LAWRENCE (NURSE PRACTIONIER), DNP.  AND  3 MONTHS WITH DR Jens SomRENSHAW.   Thank you for choosing [facility]!!

## 2017-04-11 ENCOUNTER — Other Ambulatory Visit: Payer: Self-pay | Admitting: Sports Medicine

## 2017-04-11 DIAGNOSIS — I10 Essential (primary) hypertension: Secondary | ICD-10-CM

## 2017-04-11 DIAGNOSIS — M109 Gout, unspecified: Secondary | ICD-10-CM

## 2017-04-26 ENCOUNTER — Encounter: Payer: Self-pay | Admitting: Adult Health

## 2017-04-26 ENCOUNTER — Encounter: Payer: Self-pay | Admitting: Sports Medicine

## 2017-04-26 ENCOUNTER — Ambulatory Visit: Payer: Medicare FFS | Admitting: Sports Medicine

## 2017-04-26 ENCOUNTER — Ambulatory Visit: Payer: Medicare FFS | Admitting: Adult Health

## 2017-04-26 VITALS — BP 125/66 | HR 61 | Ht 69.0 in | Wt 349.0 lb

## 2017-04-26 DIAGNOSIS — N189 Chronic kidney disease, unspecified: Secondary | ICD-10-CM | POA: Diagnosis not present

## 2017-04-26 DIAGNOSIS — I10 Essential (primary) hypertension: Secondary | ICD-10-CM

## 2017-04-26 DIAGNOSIS — R600 Localized edema: Secondary | ICD-10-CM | POA: Diagnosis not present

## 2017-04-26 DIAGNOSIS — N289 Disorder of kidney and ureter, unspecified: Secondary | ICD-10-CM

## 2017-04-26 DIAGNOSIS — I5032 Chronic diastolic (congestive) heart failure: Secondary | ICD-10-CM | POA: Diagnosis not present

## 2017-04-26 LAB — BASIC METABOLIC PANEL WITH GFR
CO2: 29 mmol/L (ref 20–32)
Chloride: 101 mmol/L (ref 98–110)
Creat: 1.54 mg/dL — ABNORMAL HIGH (ref 0.70–1.11)
Glucose, Bld: 138 mg/dL — ABNORMAL HIGH (ref 65–99)

## 2017-04-26 LAB — BASIC METABOLIC PANEL
BUN/Creatinine Ratio: 13 (calc) (ref 6–22)
BUN: 20 mg/dL (ref 7–25)
Calcium: 9.2 mg/dL (ref 8.6–10.3)
Potassium: 3.7 mmol/L (ref 3.5–5.3)
Sodium: 142 mmol/L (ref 135–146)

## 2017-04-26 MED ORDER — METOLAZONE 5 MG PO TABS: 5.0000 mg | ORAL_TABLET | Freq: Every day | ORAL | 3 refills | Status: DC

## 2017-04-26 MED ORDER — HYDRALAZINE HCL 25 MG PO TABS: 25.0000 mg | ORAL_TABLET | Freq: Two times a day (BID) | ORAL | 3 refills | Status: DC

## 2017-04-26 NOTE — Assessment & Plan Note (Signed)
Still has persistence of anasarca, he will continue with 100 mg of torsemide daily, restarting metolazone. I am going to monitor his renal function weekly. We will also monitor weights weekly with nurse visits. He is [facility] for an [facility] with  [facility], he has been to cardiology as well. Multiple labs were negative, BNP was only slightly elevated at 106, and an echocardiogram last year showed an ejection fraction of 65%. Protein, albumin, liver function were normal, renal function tends to bounce up and down but is overall acceptable. Fractional excretion of sodium was normal, urine and serum osmolality were normal. There was no evidence of proteinuria on urine dipstick testing, normal thyroid function. Return to see us in a week. If insufficient improvement with giving the due diligence of taking metolazone up to the max dose, we may try bumetanide or ethacrynic acid in place of torsemide. I would also like to evaluate him for renal artery stenosis, considering his morbid obesity I think ultrasound is probably not an acceptable tool, I am going to use MR angiography.

## 2017-04-26 NOTE — Progress Notes (Addendum)
Subjective:    CC: Follow-up  HPI: Steven Patton returns, he has had somewhat of a nonspecific gaining weight, peripheral edema, anasarca as well as periorbital edema.  He has known renal insufficiency, this was after a episode of sepsis with hospitalization.  Ultimately his cardiac function is good, ejection fraction normal, multiple other lab testing were normal, including urine and serum osmolality, his renal function was the only real abnormality.  We have tried several diuretics including furosemide, torsemide, more recently metolazone with his cardiologist, this provided some good diuresis but his renal function took a bump.  He had a renal ultrasound that was unremarkable, he has not however had appropriate imaging of his renal arteries.  Persistent and worsening swelling, weight gain, peripheral edema making it difficult to ambulate.  He had one visit with a nephrologist in [place], would like 1 more local.  Likely he does deny any chest pain, shortness of breath, orthopnea or paroxysmal nocturnal dyspnea.  I reviewed the past medical history, family history, social history, surgical history, and allergies today and no changes were needed.  Please see the problem list section below in epic for further details.  Past Medical History: Past Medical History:  Diagnosis Date  . Arthritis   . Cataract   . Hypertension    takes Amlodipine,Lisinopril,and Metoprolol daily  . Macular degeneration, left eye   . Sleep apnea    2008.Marland KitchenMarland KitchenWEARS CPAP   Past Surgical History: Past Surgical History:  Procedure Laterality Date  . BACK SURGERY    . CARDIAC CATHETERIZATION  06-10-12  . COLONOSCOPY    . EYE SURGERY     CATARACT RIGHT EYE  . KNEE SURGERY    . LUMBAR LAMINECTOMY/DECOMPRESSION MICRODISCECTOMY N/A 11/02/2012   Procedure: LUMBAR LAMINECTOMY/DECOMPRESSION MICRODISCECTOMY 2 LEVEL;  Surgeon: Emilee Hero, MD;  Location: [facility] OR;  Service: Orthopedics;  Laterality: N/A;  Lumbar 4-5,lumbar  5-sacrum 1 decompression  . TONSILLECTOMY    . TOTAL HIP ARTHROPLASTY Left 01/06/2013   Procedure: LEFT TOTAL HIP ARTHROPLASTY-Posterior approach;  Surgeon: Harvie Junior, MD;  Location: [facility] OR;  Service: Orthopedics;  Laterality: Left;  . TOTAL HIP ARTHROPLASTY Right 04/21/2013   Procedure: TOTAL HIP ARTHROPLASTY ANTERIOR APPROACH;  Surgeon: Harvie Junior, MD;  Location: [facility] OR;  Service: Orthopedics;  Laterality: Right;  . TOTAL KNEE ARTHROPLASTY Left 09/16/2012   Procedure: TOTAL KNEE ARTHROPLASTY;  Surgeon: Harvie Junior, MD;  Location: [facility] OR;  Service: Orthopedics;  Laterality: Left;   Social History: Social History   Socioeconomic History  . Marital status: Married    Spouse name: Not on file  . Number of children: 5  . Years of education: Not on file  . Highest education level: Not on file  Occupational History  . Not on file  Social Needs  . Financial resource strain: Not on file  . Food insecurity:    Worry: Not on file    Inability: Not on file  . Transportation needs:    Medical: Not on file    Non-medical: Not on file  Tobacco Use  . Smoking status: Former Smoker    Packs/day: 1.00    Years: 20.00    Pack years: 20.00    Types: Cigarettes    Last attempt to quit: 01/04/1993    Years since quitting: 24.3  . Smokeless tobacco: Former Neurosurgeon    Types: Chew    Quit date: 01/04/1993  . Tobacco comment: quit 38yrs ago  Substance and Sexual Activity  . Alcohol use: No  .  Drug use: No  . Sexual activity: Not Currently    Birth control/protection: None  Lifestyle  . Physical activity:    Days per week: Not on file    Minutes per session: Not on file  . Stress: Not on file  Relationships  . Social connections:    Talks on phone: Not on file    Gets together: Not on file    Attends religious service: Not on file    Active member of club or organization: Not on file    Attends meetings of clubs or organizations: Not on file    Relationship status: Not on file  Other  Topics Concern  . Not on file  Social History Narrative  . Not on file   Family History: Family History  Problem Relation Age of Onset  . Cancer Daughter   . Alzheimer's disease Mother   . Lung cancer Father    Allergies: No Known Allergies Medications: See med rec.  Review of Systems: No fevers, chills, night sweats, weight loss, chest pain, or shortness of breath.   Objective:    General: Well Developed, well nourished, and in no acute distress.  Neuro: Alert and oriented x3, extra-ocular muscles intact, sensation grossly intact.  HEENT: Normocephalic, atraumatic, pupils equal round reactive to light, neck supple, no masses, no lymphadenopathy, thyroid nonpalpable.  Significant dependent periorbital edema. Skin: Warm and dry, no rashes. Cardiac: Regular rate and rhythm, no murmurs rubs or gallops, 3+ symmetric bilateral lower extremity pitting edema with a negative Homans sign bilaterally. Respiratory: Clear to auscultation bilaterally. Not using accessory muscles, speaking in full sentences. Abdomen: Protuberant, nontender.  Impression and Recommendations:    Chronic renal insufficiency Still has persistence of anasarca, he will continue with 100 mg of torsemide daily, restarting metolazone. I am going to monitor his renal function weekly. We will also monitor weights weekly with nurse visits. He is waiting for an appointment with [facility], he has been to cardiology as well. Multiple labs were negative, BNP was only slightly elevated at 106, and an echocardiogram last year showed an ejection fraction of 65%. Protein, albumin, liver function were normal, renal function tends to bounce up and down but is overall acceptable. Fractional excretion of sodium was normal, urine and serum osmolality were normal. There was no evidence of proteinuria on urine dipstick testing, normal thyroid function. Return to see us in a week. If insufficient improvement with  giving the due diligence of taking metolazone up to the max dose, we may try bumetanide or ethacrynic acid in place of torsemide. I would also like to evaluate him for renal artery stenosis, considering his morbid obesity I think ultrasound is probably not an acceptable tool, I am going to use MR angiography.  I spent 40 minutes with this patient, greater than 50% was face-to-face time counseling regarding the above diagnoses ___________________________________________ Ihor Austinhomas J. Benjamin Stainhekkekandam, M.D., ABFM., CAQSM. Primary Care and Sports Medicine [facility]  Adjunct Instructor of Family Medicine  [employer]

## 2017-04-26 NOTE — Progress Notes (Signed)
Cardiology Office Note   Date:  04/26/2017   ID:  Steven Patton, DOB 06/28/1935, MRN [id number]  PCP:  Monica Bectonhekkekandam, Thomas J, MD  Cardiologist: Dr. Jens Somrenshaw Chief Complaint  Patient presents with  . Follow-up  . Congestive Heart Failure    Diastolic CHF     History of Present Illness: Steven Patton is a 82 y.o. male who presents for ongoing assessment and management of hypertension.  The patient had gained approximately 40 pounds with worsening lower extremity edema, abdominal pain, PND and orthopnea.  He had had a catheterization in May 2014 revealing normal coronary arteries with an EF of 60%.  He is also being followed by nephrology who had requested a renal ultrasound to evaluate kidney function.  He is also undergoing physical therapy after having back surgery.  He was very sedentary.  The patient was changed from Lasix to torsemide, he was to take it on empty stomach waiting 30 minutes for better bioavailability.  He was started on metolazone which he was to take the following day, wait 2 days and take again if he was did not have enough diuresis.  He was to follow-up with a nephrologist for chronic kidney disease [place]  On last office visit on 03/29/2017 he was advised to take torsemide 40 mg once a day alternating with 20 mg every other day, continue his potassium and take medications on empty stomach 30 minutes prior to eating.  Continued to have chronic lower extremity edema which was felt to be related to dependent edema.  Due to recent kidney function tests with reduced GFR I discontinued lisinopril and begin hydralazine 25 mg twice daily instead.  He has not seen his PCP, who is ordered a renal artery to rule out stenosis.  The patient is also going to be followed by nephrology within a month, and this is being set up by PCP as he was unable to get an appointment earlier.  He states he is feeling much better, he is wearing his support hose  now, his weight has not changed very much but his edema is better, he does not complain of PND or orthopnea.  He continues to use CPAP at home.  His daughter and his wife are very strict concerning his salt intake.  He is tolerating hydralazine.  Past Medical History:  Diagnosis Date  . Arthritis   . Cataract   . Hypertension    takes Amlodipine,Lisinopril,and Metoprolol daily  . Macular degeneration, left eye   . Sleep apnea    2008.Marland Kitchen.Marland Kitchen.WEARS CPAP    Past Surgical History:  Procedure Laterality Date  . BACK SURGERY    . CARDIAC CATHETERIZATION  06-10-12  . COLONOSCOPY    . EYE SURGERY     CATARACT RIGHT EYE  . KNEE SURGERY    . LUMBAR LAMINECTOMY/DECOMPRESSION MICRODISCECTOMY N/A 11/02/2012   Procedure: LUMBAR LAMINECTOMY/DECOMPRESSION MICRODISCECTOMY 2 LEVEL;  Surgeon: Emilee HeroMark Leonard Dumonski, MD;  [facility];  Service: Orthopedics;  Laterality: N/A;  Lumbar 4-5,lumbar 5-sacrum 1 decompression  . TONSILLECTOMY    . TOTAL HIP ARTHROPLASTY Left 01/06/2013   Procedure: LEFT TOTAL HIP ARTHROPLASTY-Posterior approach;  Surgeon: Harvie JuniorJohn L Graves, MD;  [facility];  Service: Orthopedics;  Laterality: Left;  . TOTAL HIP ARTHROPLASTY Right 04/21/2013   Procedure: TOTAL HIP ARTHROPLASTY ANTERIOR APPROACH;  Surgeon: Harvie JuniorJohn L Graves, MD;  [facility] OR;  Service: Orthopedics;  Laterality: Right;  . TOTAL KNEE ARTHROPLASTY Left 09/16/2012   Procedure: TOTAL  KNEE ARTHROPLASTY;  Surgeon: Harvie Junior, MD;  [facility] OR;  Service: Orthopedics;  Laterality: Left;     Current Outpatient Medications  Medication Sig Dispense Refill  . allopurinol (ZYLOPRIM) 300 MG tablet TAKE 1 TABLET (300 MG TOTAL) BY MOUTH 2 (TWO) TIMES DAILY. 180 tablet 1  . AMBULATORY NON FORMULARY MEDICATION Knee-high, firm compression, custom made, graduated compression stockings. Apply to lower extremities. 1 each prn  . amLODipine (NORVASC) 10 MG tablet Take 1 tablet (10 mg total) by mouth daily. 90 tablet 3  . aspirin  81 MG tablet Take 1 tablet (81 mg total) by mouth daily. 90 tablet 3  . atorvastatin (LIPITOR) 40 MG tablet TAKE 1 TABLET (40 MG TOTAL) BY MOUTH DAILY AT 6 PM.  90 tablet 3  . carvedilol (COREG) 25 MG tablet TAKE 1 TABLET TWICE DAILY WITH A MEAL 180 tablet 3  . gabapentin (NEURONTIN) 600 MG tablet TAKE 1 TABLET THREE TIMES DAILY 270 tablet 3  . hydrALAZINE (APRESOLINE) 25 MG tablet Take 1 tablet (25 mg total) by mouth 2 (two) times daily. 60 tablet 3  . meloxicam (MOBIC) 15 MG tablet TAKE 1 TABLET EVERY DAY 90 tablet 3  . metolazone (ZAROXOLYN) 5 MG tablet Take 1 tablet (5 mg total) by mouth daily. 30 tablet 3  . torsemide (DEMADEX) 20 MG tablet Take 2 tablets (40 mg total) by mouth 2 (two) times daily. 120 tablet 3  . nitroGLYCERIN (NITROSTAT) 0.4 MG SL tablet Place under the tongue. Reported on 04/08/2015     No current facility-administered medications for this visit.     Allergies:   Patient has no known allergies.    Social History:  The patient  reports that he quit smoking about 24 years ago. His smoking use included cigarettes. He has a 20.00 pack-year smoking history. He quit smokeless tobacco use about 24 years ago. His smokeless tobacco use included chew. He reports that he does not drink alcohol or use drugs.   Family History:  The patient's family history includes Alzheimer's disease in his mother; Cancer in his daughter; Lung cancer in his father.    ROS: All other systems are reviewed and negative. Unless otherwise mentioned in H&P    PHYSICAL EXAM: VS:  BP 125/66   Pulse 61   Ht 5\' 9"  (1.753 m)   Wt (!) 349 lb (158.3 kg)   BMI 51.54 kg/m  , BMI Body mass index is 51.54 kg/m. GEN: Well nourished, well developed, in no acute distress obese HEENT: normal  Neck: no JVD, carotid bruits, or masses Cardiac: RRR; 1/6 systolic murmur, no rubs, positive dependent edema but lessened with knee-high support hose  gallops, Respiratory:  clear to auscultation bilaterally, normal  work of breathing GI: soft, nontender, nondistended, + BS MS: no deformity or atrophy  Skin: warm and dry, no rash Neuro:  Strength and sensation are intact Psych: euthymic mood, full affect  Recent Labs: 12/01/2016: TSH 4.31 12/28/2016: ALT 12; Brain Natriuretic Peptide 114; Hemoglobin 13.5; Platelets 127 03/25/2017: BUN 49; Creatinine, Ser 1.89; Potassium 3.5; Sodium 139    Lipid Panel    Component Value Date/Time   CHOL 168 09/28/2016 1410   TRIG 220 (H) 09/28/2016 1410   HDL 31 (L) 09/28/2016 1410   CHOLHDL 5.4 (H) 09/28/2016 1410   VLDL 35 04/05/2014 1030   LDLCALC 109 (H) 04/05/2014 1030      Wt Readings from Last 3 Encounters:  04/26/17 (!) 349 lb (158.3 kg)  04/26/17 Marland Kitchen)  349 lb (158.3 kg)  03/29/17 (!) 343 lb (155.6 kg)      Other studies Reviewed: Echocardiogram Apr 11, 2017 Left ventricle: The cavity size was normal. There was mild   concentric hypertrophy. Systolic function was normal. The   estimated ejection fraction was in the range of 60% to 65%. Wall   motion was normal; there were no regional wall motion   abnormalities. Features are consistent with a pseudonormal left   ventricular filling pattern, with concomitant abnormal relaxation   and increased filling pressure (grade 2 diastolic dysfunction). - Mitral valve: Calcified annulus. - Left atrium: The atrium was severely dilated. - Right atrium: The atrium was mildly dilated.  Renal Ultrasound: 03/17/2017 IMPRESSION: Unremarkable renal ultrasound examination.  No hydronephrosis.  ASSESSMENT AND PLAN:  1.  Chronic diastolic CHF: He is essentially euvolemic significant lower extremity edema as I have on prior appointment, nor does he have abdominal distention.  His weight is the same.  He will continue on current diuretic dosing.  He is complaining of some nocturia so recommend that he take his afternoon dose around 4 PM.  2.  Chronic kidney disease: Labs have recently been drawn by PCP.  Most recent  labs on 03/25/2017 revealed a creatinine of 1.89, GFR 33.  He is due to have an appointment with nephrology within the next month.  Further testing by PCP is been ordered for preparation to be seen by nephrology.  3.  Hypertension: Blood pressure is well controlled on hydralazine 25 mg twice daily.  Have recently discontinued lisinopril due to worsening of GFR.  Follow-up labs are pending.   Current medicines are reviewed at length with the patient today.    Labs/ tests ordered today include: Recently ordered by PCP. Bettey Mare. Liborio Nixon, ANP, AACC   04/26/2017 4:00 PM    [facility] [address], [place], [place] [address] Phone: [phone]; Fax: [phone]

## 2017-04-26 NOTE — Patient Instructions (Signed)
Medication Instructions:  NO CHANGES- Your physician recommends that you continue on your current medications as directed. Please refer to the Current Medication list given to you today.  If you need a refill on your cardiac medications before your next appointment, please call your pharmacy.  Follow-Up: Your physician wants you to follow-up in: make sure to keep scheduled appt w/Dr Jens Somrenshaw.  Thank you for choosing [facility]!!

## 2017-05-03 ENCOUNTER — Ambulatory Visit: Payer: Medicare FFS | Admitting: Sports Medicine

## 2017-05-08 ENCOUNTER — Ambulatory Visit (HOSPITAL_BASED_OUTPATIENT_CLINIC_OR_DEPARTMENT_OTHER): Payer: Medicare FFS

## 2017-05-10 ENCOUNTER — Ambulatory Visit: Payer: Medicare FFS | Admitting: Sports Medicine

## 2017-05-10 DIAGNOSIS — Z0189 Encounter for other specified special examinations: Secondary | ICD-10-CM

## 2017-05-11 ENCOUNTER — Ambulatory Visit (HOSPITAL_COMMUNITY)
Admission: RE | Admit: 2017-05-11 | Discharge: 2017-05-11 | Disposition: A | Discharge status: Home / Self Care | Payer: Medicare FFS | Source: Ambulatory Visit | Attending: Sports Medicine | Admitting: Sports Medicine

## 2017-05-11 ENCOUNTER — Encounter: Payer: Self-pay | Admitting: Sports Medicine

## 2017-05-11 ENCOUNTER — Ambulatory Visit: Payer: Medicare FFS | Admitting: Sports Medicine

## 2017-05-11 DIAGNOSIS — N189 Chronic kidney disease, unspecified: Secondary | ICD-10-CM | POA: Diagnosis not present

## 2017-05-11 DIAGNOSIS — E876 Hypokalemia: Secondary | ICD-10-CM | POA: Diagnosis not present

## 2017-05-11 DIAGNOSIS — K8020 Calculus of gallbladder without cholecystitis without obstruction: Secondary | ICD-10-CM | POA: Insufficient documentation

## 2017-05-11 DIAGNOSIS — N289 Disorder of kidney and ureter, unspecified: Secondary | ICD-10-CM

## 2017-05-11 NOTE — Progress Notes (Addendum)
Subjective:    CC:  Follow-up  HPI: This is a pleasant 82 year old male, we have been treating him for severe anasarca, had some difficulty getting diuretics to work but more recently with the addition of metolazone he had a fantastic improvement.  Symptoms are improved considerably, edema has returned to baseline, he is lost over 10 pounds of fluids.  Stopped metolazone a few days ago.  He does have the appointment coming up today for an MR angiogram of his renal arteries.  I reviewed the past medical history, family history, social history, surgical history, and allergies today and no changes were needed.  Please see the problem list section below in epic for further details.  Past Medical History: Past Medical History:  Diagnosis Date  . Arthritis   . Cataract   . Hypertension    takes Amlodipine,Lisinopril,and Metoprolol daily  . Macular degeneration, left eye   . Sleep apnea    2008.Marland Kitchen.Marland Kitchen.WEARS CPAP   Past Surgical History: Past Surgical History:  Procedure Laterality Date  . BACK SURGERY    . CARDIAC CATHETERIZATION  06-10-12  . COLONOSCOPY    . EYE SURGERY     CATARACT RIGHT EYE  . KNEE SURGERY    . LUMBAR LAMINECTOMY/DECOMPRESSION MICRODISCECTOMY N/A 11/02/2012   Procedure: LUMBAR LAMINECTOMY/DECOMPRESSION MICRODISCECTOMY 2 LEVEL;  Surgeon: Emilee HeroMark Leonard Dumonski, MD;  [facility];  Service: Orthopedics;  Laterality: N/A;  Lumbar 4-5,lumbar 5-sacrum 1 decompression  . TONSILLECTOMY    . TOTAL HIP ARTHROPLASTY Left 01/06/2013   Procedure: LEFT TOTAL HIP ARTHROPLASTY-Posterior approach;  Surgeon: Harvie JuniorJohn L Graves, MD;  [facility];  Service: Orthopedics;  Laterality: Left;  . TOTAL HIP ARTHROPLASTY Right 04/21/2013   Procedure: TOTAL HIP ARTHROPLASTY ANTERIOR APPROACH;  Surgeon: Harvie JuniorJohn L Graves, MD;  [facility] OR;  Service: Orthopedics;  Laterality: Right;  . TOTAL KNEE ARTHROPLASTY Left 09/16/2012   Procedure: TOTAL KNEE ARTHROPLASTY;  Surgeon: Harvie JuniorJohn L Graves, MD;  [facility] OR;  Service: Orthopedics;  Laterality: Left;   Social History: Social History   Socioeconomic History  . Marital status: Married    Spouse name: Not on file  . Number of children: 5  . Years of education: Not on file  . Highest education level: Not on file  Occupational History  . Not on file  Social Needs  . Financial resource strain: Not on file  . Food insecurity:    Worry: Not on file    Inability: Not on file  . Transportation needs:    Medical: Not on file    Non-medical: Not on file  Tobacco Use  . Smoking status: Former Smoker    Packs/day: 1.00    Years: 20.00    Pack years: 20.00    Types: Cigarettes    Last attempt to quit: 01/04/1993    Years since quitting: 24.3  . Smokeless tobacco: Former NeurosurgeonUser    Types: Chew    Quit date: 01/04/1993  . Tobacco comment: quit 554yrs ago  Substance and Sexual Activity  . Alcohol use: No  . Drug use: No  . Sexual activity: Not Currently    Birth control/protection: None  Lifestyle  . Physical activity:    Days per week: Not on file    Minutes per session: Not on file  . Stress: Not on file  Relationships  . Social connections:    Talks on phone: Not on file    Gets together: Not on file    Attends religious service: Not on  file    Active member of club or organization: Not on file    Attends meetings of clubs or organizations: Not on file    Relationship status: Not on file  Other Topics Concern  . Not on file  Social History Narrative  . Not on file   Family History: Family History  Problem Relation Age of Onset  . Cancer Daughter   . Alzheimer's disease Mother   . Lung cancer Father    Allergies: No Known Allergies Medications: See med rec.  Review of Systems: No fevers, chills, night sweats, weight loss, chest pain, or shortness of breath.   Objective:    General: Well Developed, well nourished, and in no acute distress.  Neuro: Alert and oriented x3, extra-ocular muscles intact, sensation grossly  intact.  HEENT: Normocephalic, atraumatic, pupils equal round reactive to light, neck supple, no masses, no lymphadenopathy, thyroid nonpalpable.  Skin: Warm and dry, no rashes. Cardiac: Regular rate and rhythm, no murmurs rubs or gallops, 1+ lower extremity edema, abdominal edema has resolved, facial edema has improved considerably. Respiratory: Clear to auscultation bilaterally. Not using accessory muscles, speaking in full sentences. Abdomen: Soft, nontender, nondistended, bowel sounds, no palpable masses, no guarding, rigidity, rebound tenderness.  Impression and Recommendations:    Chronic renal insufficiency Fantastic improvements with the addition of metolazone. He was decreased to 80 mg of torsemide, divided into twice a day on an empty stomach. Recently stopped metolazone, over 10 pound weight loss of fluids, anasarca, edema improved considerably. He does have an appointment coming up for his renal MR angiography and an appointment coming up with [facility]. He will hold off on metolazone for now and only use it as a secret weapon if the anasarca comes back. Rechecking renal function today.  Hypokalemia Cont to stay off metolazone for now and be sure to drink plenty of water.  Renal function is slightly high but this will be a delicate balance between tanking kidneys and being volume overloaded.  Potassium is VERY low, adding potassium supplement 40 meQ daily for a week then recheck.  I spent 25 minutes with this patient, greater than 50% was face-to-face time counseling regarding the above diagnoses ___________________________________________ Steven Patton. [name], M.D., ABFM., CAQSM. Primary Care and Sports Medicine [facility]  Adjunct Instructor of Family Medicine  [employer]

## 2017-05-11 NOTE — Assessment & Plan Note (Signed)
Fantastic improvements with the addition of metolazone. He was decreased to 80 mg of torsemide, divided into twice a day on an empty stomach. Recently stopped metolazone, over 10 pound weight loss of fluids, anasarca, edema improved considerably. He does have an appointment coming up for his renal MR angiography and an appointment coming up with [facility]. He will hold off on metolazone for now and only use it as a secret weapon if the anasarca comes back. Rechecking renal function today.

## 2017-05-12 DIAGNOSIS — E876 Hypokalemia: Secondary | ICD-10-CM | POA: Insufficient documentation

## 2017-05-12 LAB — HEMOGLOBIN A1C
Hgb A1c MFr Bld: 6.6 % of total Hgb — ABNORMAL HIGH (ref <5.7)
Mean Plasma Glucose: 143 (calc)
eAG (mmol/L): 7.9 (calc)

## 2017-05-12 LAB — COMPREHENSIVE METABOLIC PANEL
AG Ratio: 2 (calc) (ref 1.0–2.5)
AST: 26 U/L (ref 10–35)
Alkaline phosphatase (APISO): 52 U/L (ref 40–115)
CO2: 37 mmol/L — ABNORMAL HIGH (ref 20–32)
Calcium: 9.4 mg/dL (ref 8.6–10.3)
Chloride: 94 mmol/L — ABNORMAL LOW (ref 98–110)
Globulin: 2.2 g/dL (calc) (ref 1.9–3.7)
Total Bilirubin: 0.7 mg/dL (ref 0.2–1.2)

## 2017-05-12 LAB — COMPREHENSIVE METABOLIC PANEL WITH GFR
ALT: 21 U/L (ref 9–46)
Albumin: 4.3 g/dL (ref 3.6–5.1)
BUN/Creatinine Ratio: 21 (calc) (ref 6–22)
BUN: 44 mg/dL — ABNORMAL HIGH (ref 7–25)
Creat: 2.14 mg/dL — ABNORMAL HIGH (ref 0.70–1.11)
Glucose, Bld: 191 mg/dL — ABNORMAL HIGH (ref 65–99)
Potassium: 2.9 mmol/L — ABNORMAL LOW (ref 3.5–5.3)
Sodium: 142 mmol/L (ref 135–146)
Total Protein: 6.5 g/dL (ref 6.1–8.1)

## 2017-05-12 MED ORDER — POTASSIUM CHLORIDE CRYS ER 20 MEQ PO TBCR: 40.0000 meq | EXTENDED_RELEASE_TABLET | Freq: Every day | ORAL | 3 refills | Status: DC

## 2017-05-12 NOTE — Addendum Note (Signed)
Addended by: Monica BectonHEKKEKANDAM, Amaryllis Malmquist J on: 05/12/2017 07:54 AM   Modules accepted: Orders

## 2017-05-12 NOTE — Assessment & Plan Note (Signed)
Cont to stay off metolazone for now and be sure to drink plenty of water.  Renal function is slightly high but this will be a delicate balance between tanking kidneys and being volume overloaded.  Potassium is VERY low, adding potassium supplement 40 meQ daily for a week then recheck.

## 2017-05-17 ENCOUNTER — Ambulatory Visit: Payer: Medicare FFS | Admitting: Sports Medicine

## 2017-05-24 ENCOUNTER — Ambulatory Visit: Payer: Medicare FFS | Admitting: Sports Medicine

## 2017-05-24 ENCOUNTER — Encounter: Payer: Self-pay | Admitting: Sports Medicine

## 2017-05-24 DIAGNOSIS — N189 Chronic kidney disease, unspecified: Secondary | ICD-10-CM | POA: Diagnosis not present

## 2017-05-24 DIAGNOSIS — E876 Hypokalemia: Secondary | ICD-10-CM | POA: Diagnosis not present

## 2017-05-24 DIAGNOSIS — N289 Disorder of kidney and ureter, unspecified: Secondary | ICD-10-CM

## 2017-05-24 DIAGNOSIS — F5101 Primary insomnia: Secondary | ICD-10-CM

## 2017-05-24 DIAGNOSIS — G4700 Insomnia, unspecified: Secondary | ICD-10-CM | POA: Insufficient documentation

## 2017-05-24 LAB — COMPREHENSIVE METABOLIC PANEL
ALT: 18 U/L (ref 9–46)
AST: 22 U/L (ref 10–35)
Alkaline phosphatase (APISO): 50 U/L (ref 40–115)
BUN/Creatinine Ratio: 16 (calc) (ref 6–22)
BUN: 24 mg/dL (ref 7–25)
Creat: 1.51 mg/dL — ABNORMAL HIGH (ref 0.70–1.11)
Globulin: 2.5 g/dL (calc) (ref 1.9–3.7)
Glucose, Bld: 158 mg/dL — ABNORMAL HIGH (ref 65–99)
Total Bilirubin: 0.8 mg/dL (ref 0.2–1.2)

## 2017-05-24 LAB — COMPREHENSIVE METABOLIC PANEL WITH GFR
AG Ratio: 1.6 (calc) (ref 1.0–2.5)
Albumin: 4.1 g/dL (ref 3.6–5.1)
CO2: 32 mmol/L (ref 20–32)
Calcium: 9.3 mg/dL (ref 8.6–10.3)
Chloride: 95 mmol/L — ABNORMAL LOW (ref 98–110)
Potassium: 2.9 mmol/L — ABNORMAL LOW (ref 3.5–5.3)
Sodium: 141 mmol/L (ref 135–146)
Total Protein: 6.6 g/dL (ref 6.1–8.1)

## 2017-05-24 MED ORDER — TRAZODONE HCL 50 MG PO TABS: 50.0000 mg | ORAL_TABLET | Freq: Every day | ORAL | 3 refills | Status: DC

## 2017-05-24 MED ORDER — NITROGLYCERIN 0.4 MG SL SUBL: 0.4000 mg | SUBLINGUAL_TABLET | SUBLINGUAL | 0 refills | Status: DC | PRN

## 2017-05-24 NOTE — Progress Notes (Addendum)
Subjective:    CC: Follow-up  HPI: Hypertension, chronic renal insufficiency, anasarca: Essentially resolved now after the burst of metolazone, now well controlled on 40 mg of torsemide twice a day on an empty stomach.  Happy with how things are going.  Insomnia: Tends to void through the night as expected with his diuretic regimen, difficulty with getting back to sleep afterwards.  I reviewed the past medical history, family history, social history, surgical history, and allergies today and no changes were needed.  Please see the problem list section below in epic for further details.  Past Medical History: Past Medical History:  Diagnosis Date  . Arthritis   . Cataract   . Hypertension    takes Amlodipine,Lisinopril,and Metoprolol daily  . Macular degeneration, left eye   . Sleep apnea    2008.Marland Kitchen.Marland Kitchen.WEARS CPAP   Past Surgical History: Past Surgical History:  Procedure Laterality Date  . BACK SURGERY    . CARDIAC CATHETERIZATION  06-10-12  . COLONOSCOPY    . EYE SURGERY     CATARACT RIGHT EYE  . KNEE SURGERY    . LUMBAR LAMINECTOMY/DECOMPRESSION MICRODISCECTOMY N/A 11/02/2012   Procedure: LUMBAR LAMINECTOMY/DECOMPRESSION MICRODISCECTOMY 2 LEVEL;  Surgeon: Emilee HeroMark Leonard Dumonski, MD;  [facility];  Service: Orthopedics;  Laterality: N/A;  Lumbar 4-5,lumbar 5-sacrum 1 decompression  . TONSILLECTOMY    . TOTAL HIP ARTHROPLASTY Left 01/06/2013   Procedure: LEFT TOTAL HIP ARTHROPLASTY-Posterior approach;  Surgeon: Harvie JuniorJohn L Graves, MD;  [facility];  Service: Orthopedics;  Laterality: Left;  . TOTAL HIP ARTHROPLASTY Right 04/21/2013   Procedure: TOTAL HIP ARTHROPLASTY ANTERIOR APPROACH;  Surgeon: Harvie JuniorJohn L Graves, MD;  Location: [facility] OR;  Service: Orthopedics;  Laterality: Right;  . TOTAL KNEE ARTHROPLASTY Left 09/16/2012   Procedure: TOTAL KNEE ARTHROPLASTY;  Surgeon: Harvie JuniorJohn L Graves, MD;  Location: [facility] OR;  Service: Orthopedics;  Laterality: Left;   Social History: Social History    Socioeconomic History  . Marital status: Married    Spouse name: Not on file  . Number of children: 5  . Years of education: Not on file  . Highest education level: Not on file  Occupational History  . Not on file  Social Needs  . Financial resource strain: Not on file  . Food insecurity:    Worry: Not on file    Inability: Not on file  . Transportation needs:    Medical: Not on file    Non-medical: Not on file  Tobacco Use  . Smoking status: Former Smoker    Packs/day: 1.00    Years: 20.00    Pack years: 20.00    Types: Cigarettes    Last attempt to quit: 01/04/1993    Years since quitting: 24.4  . Smokeless tobacco: Former NeurosurgeonUser    Types: Chew    Quit date: 01/04/1993  . Tobacco comment: quit 4043yrs ago  Substance and Sexual Activity  . Alcohol use: No  . Drug use: No  . Sexual activity: Not Currently    Birth control/protection: None  Lifestyle  . Physical activity:    Days per week: Not on file    Minutes per session: Not on file  . Stress: Not on file  Relationships  . Social connections:    Talks on phone: Not on file    Gets together: Not on file    Attends religious service: Not on file    Active member of club or organization: Not on file    Attends meetings of clubs or  organizations: Not on file    Relationship status: Not on file  Other Topics Concern  . Not on file  Social History Narrative  . Not on file   Family History: Family History  Problem Relation Age of Onset  . Cancer Daughter   . Alzheimer's disease Mother   . Lung cancer Father    Allergies: No Known Allergies Medications: See med rec.  Review of Systems: No fevers, chills, night sweats, weight loss, chest pain, or shortness of breath.   Objective:    General: Well Developed, well nourished, and in no acute distress.  Neuro: Alert and oriented x3, extra-ocular muscles intact, sensation grossly intact.  HEENT: Normocephalic, atraumatic, pupils equal round reactive to light,  neck supple, no masses, no lymphadenopathy, thyroid nonpalpable.  Skin: Warm and dry, no rashes. Cardiac: Regular rate and rhythm, no murmurs rubs or gallops, 1+ lower extremity edema.  Respiratory: Clear to auscultation bilaterally. Not using accessory muscles, speaking in full sentences.   Impression and Recommendations:    Hypokalemia Rechecking potassium levels today. He will continue to stay off of metolazone for now, this will continue to be a delicate balance between taking his kidneys and being volume overloaded.  Potassium is still very low, adding low-dose Spironolactone, recheck in 1 week.  Chronic renal insufficiency Decreased to 40 mg of torsemide twice a day on an empty stomach. Recently discontinued metolazone, over a 10 pound weight loss with improvements in fluids, anasarca, edema, all down to baseline. MR angiography showed no evidence of renal artery stenosis. We will hold off on metolazone for now and use it as a secret weapon should the anasarca return. Rechecking renal insufficiency today, blood pressure is now well controlled.  Insomnia Does wake up a few times to void but this is going to be unavoidable with his diuretic regimen, adding some trazodone, 50 mg at bedtime, we can augment as needed.   ___________________________________________ [name]. [name], M.D., ABFM., CAQSM. Primary Care and Sports Medicine [facility]  Adjunct Instructor of Family Medicine  [employer]

## 2017-05-24 NOTE — Assessment & Plan Note (Signed)
Decreased to 40 mg of torsemide twice a day on an empty stomach. Recently discontinued metolazone, over a 10 pound weight loss with improvements in fluids, anasarca, edema, all down to baseline. MR angiography showed no evidence of renal artery stenosis. We will hold off on metolazone for now and use it as a secret weapon should the anasarca return. Rechecking renal insufficiency today, blood pressure is now well controlled.

## 2017-05-24 NOTE — Assessment & Plan Note (Signed)
Does wake up a few times to void but this is going to be unavoidable with his diuretic regimen, adding some trazodone, 50 mg at bedtime, we can augment as needed.

## 2017-05-24 NOTE — Assessment & Plan Note (Addendum)
Rechecking potassium levels today. He will continue to stay off of metolazone for now, this will continue to be a delicate balance between taking his kidneys and being volume overloaded.  Potassium is still very low, adding low-dose Spironolactone, recheck in 1 week.

## 2017-05-25 MED ORDER — SPIRONOLACTONE 25 MG PO TABS: 25.0000 mg | ORAL_TABLET | Freq: Every day | ORAL | 3 refills | Status: DC

## 2017-05-25 NOTE — Addendum Note (Signed)
Addended by: Monica BectonHEKKEKANDAM, Wajiha Versteeg J on: 05/25/2017 08:21 AM   Modules accepted: Orders

## 2017-06-05 ENCOUNTER — Other Ambulatory Visit: Payer: Self-pay | Admitting: Sports Medicine

## 2017-06-05 DIAGNOSIS — I10 Essential (primary) hypertension: Secondary | ICD-10-CM

## 2017-06-08 ENCOUNTER — Other Ambulatory Visit: Payer: Self-pay

## 2017-06-08 DIAGNOSIS — E876 Hypokalemia: Secondary | ICD-10-CM

## 2017-06-08 DIAGNOSIS — N189 Chronic kidney disease, unspecified: Secondary | ICD-10-CM

## 2017-06-08 MED ORDER — SPIRONOLACTONE 25 MG PO TABS: 25.0000 mg | ORAL_TABLET | Freq: Every day | ORAL | 1 refills | Status: DC

## 2017-06-08 MED ORDER — METOLAZONE 5 MG PO TABS: 5.0000 mg | ORAL_TABLET | Freq: Every day | ORAL | 1 refills | Status: DC

## 2017-07-19 ENCOUNTER — Ambulatory Visit: Payer: Medicare FFS | Admitting: Cardiology

## 2017-07-20 ENCOUNTER — Other Ambulatory Visit: Payer: Self-pay | Admitting: Adult Health

## 2017-08-12 ENCOUNTER — Encounter: Payer: Self-pay | Admitting: Sports Medicine

## 2017-08-12 ENCOUNTER — Ambulatory Visit (INDEPENDENT_AMBULATORY_CARE_PROVIDER_SITE_OTHER): Payer: Medicare FFS

## 2017-08-12 ENCOUNTER — Ambulatory Visit: Payer: Medicare FFS | Admitting: Sports Medicine

## 2017-08-12 DIAGNOSIS — M5137 Other intervertebral disc degeneration, lumbosacral region: Secondary | ICD-10-CM

## 2017-08-12 DIAGNOSIS — M542 Cervicalgia: Secondary | ICD-10-CM | POA: Diagnosis not present

## 2017-08-12 DIAGNOSIS — G9520 Unspecified cord compression: Secondary | ICD-10-CM

## 2017-08-12 DIAGNOSIS — E876 Hypokalemia: Secondary | ICD-10-CM

## 2017-08-12 DIAGNOSIS — M4807 Spinal stenosis, lumbosacral region: Secondary | ICD-10-CM | POA: Diagnosis not present

## 2017-08-12 DIAGNOSIS — M48062 Spinal stenosis, lumbar region with neurogenic claudication: Secondary | ICD-10-CM

## 2017-08-12 DIAGNOSIS — Z981 Arthrodesis status: Secondary | ICD-10-CM

## 2017-08-12 NOTE — Assessment & Plan Note (Signed)
Severe L4-L5 spinal stenosis, also severely stenotic at T11-T12. Numbness on the anterolateral right thigh. Nerve conduction/EMG to differentiate meralgia paresthetica versus radicular paresthesias. I am going to get a new MRI of his lumbar spine, and a referral to neurosurgery to discuss decompressive laminectomy. He has had epidurals that were not efficacious, and does have progressive weakness. He also understands that decompressive laminectomy may not reverse his symptoms.

## 2017-08-12 NOTE — Progress Notes (Addendum)
Subjective:    CC: Multiple issues  HPI: Hand numbness: History of central cord syndrome, post cervical ACDF, all paresthesias resolved, now having a recurrence of pain and paresthesias running down into the right hand to the third, fourth, fifth fingers.  In addition he has progressive weakness of both legs, paresthesias and numbness over the anterolateral right thigh.  He has known T11-T12 and L4-L5 severe central canal stenosis not responsive to lumbar epidurals.  No bowel or bladder dysfunction.  I reviewed the past medical history, family history, social history, surgical history, and allergies today and no changes were needed.  Please see the problem list section below in epic for further details.  Past Medical History: Past Medical History:  Diagnosis Date  . Arthritis   . Cataract   . Hypertension    takes Amlodipine,Lisinopril,and Metoprolol daily  . Macular degeneration, left eye   . Sleep apnea    2008.Marland Kitchen.Marland Kitchen.WEARS CPAP   Past Surgical History: Past Surgical History:  Procedure Laterality Date  . BACK SURGERY    . CARDIAC CATHETERIZATION  06-10-12  . COLONOSCOPY    . EYE SURGERY     CATARACT RIGHT EYE  . KNEE SURGERY    . LUMBAR LAMINECTOMY/DECOMPRESSION MICRODISCECTOMY N/A 11/02/2012   Procedure: LUMBAR LAMINECTOMY/DECOMPRESSION MICRODISCECTOMY 2 LEVEL;  Surgeon: Emilee HeroMark Leonard Dumonski, MD;  [facility];  Service: Orthopedics;  Laterality: N/A;  Lumbar 4-5,lumbar 5-sacrum 1 decompression  . TONSILLECTOMY    . TOTAL HIP ARTHROPLASTY Left 01/06/2013   Procedure: LEFT TOTAL HIP ARTHROPLASTY-Posterior approach;  Surgeon: Harvie JuniorJohn L Graves, MD;  [facility];  Service: Orthopedics;  Laterality: Left;  . TOTAL HIP ARTHROPLASTY Right 04/21/2013   Procedure: TOTAL HIP ARTHROPLASTY ANTERIOR APPROACH;  Surgeon: Harvie JuniorJohn L Graves, MD;  Location: [facility] OR;  Service: Orthopedics;  Laterality: Right;  . TOTAL KNEE ARTHROPLASTY Left 09/16/2012   Procedure: TOTAL KNEE ARTHROPLASTY;  Surgeon:  Harvie JuniorJohn L Graves, MD;  Location: [facility] OR;  Service: Orthopedics;  Laterality: Left;   Social History: Social History   Socioeconomic History  . Marital status: Married    Spouse name: Not on file  . Number of children: 5  . Years of education: Not on file  . Highest education level: Not on file  Occupational History  . Not on file  Social Needs  . Financial resource strain: Not on file  . Food insecurity:    Worry: Not on file    Inability: Not on file  . Transportation needs:    Medical: Not on file    Non-medical: Not on file  Tobacco Use  . Smoking status: Former Smoker    Packs/day: 1.00    Years: 20.00    Pack years: 20.00    Types: Cigarettes    Last attempt to quit: 01/04/1993    Years since quitting: 24.6  . Smokeless tobacco: Former NeurosurgeonUser    Types: Chew    Quit date: 01/04/1993  . Tobacco comment: quit 2474yrs ago  Substance and Sexual Activity  . Alcohol use: No  . Drug use: No  . Sexual activity: Not Currently    Birth control/protection: None  Lifestyle  . Physical activity:    Days per week: Not on file    Minutes per session: Not on file  . Stress: Not on file  Relationships  . Social connections:    Talks on phone: Not on file    Gets together: Not on file    Attends religious service: Not on file  Active member of club or organization: Not on file    Attends meetings of clubs or organizations: Not on file    Relationship status: Not on file  Other Topics Concern  . Not on file  Social History Narrative  . Not on file   Family History: Family History  Problem Relation Age of Onset  . Cancer Daughter   . Alzheimer's disease Mother   . Lung cancer Father    Allergies: No Known Allergies Medications: See med rec.  Review of Systems: No fevers, chills, night sweats, weight loss, chest pain, or shortness of breath.   Objective:    General: Well Developed, well nourished, and in no acute distress.  Neuro: Alert and oriented x3, extra-ocular  muscles intact, sensation grossly intact.  HEENT: Normocephalic, atraumatic, pupils equal round reactive to light, neck supple, no masses, no lymphadenopathy, thyroid nonpalpable.  Skin: Warm and dry, no rashes. Cardiac: Regular rate and rhythm, no murmurs rubs or gallops, no lower extremity edema.  Respiratory: Clear to auscultation bilaterally. Not using accessory muscles, speaking in full sentences.  Impression and Recommendations:    Lumbar stenosis with neurogenic claudication Severe L4-L5 spinal stenosis, also severely stenotic at T11-T12. Numbness on the anterolateral right thigh. Nerve conduction/EMG to differentiate meralgia paresthetica versus radicular paresthesias. I am going to get a new MRI of his lumbar spine, and a referral to neurosurgery to discuss decompressive laminectomy. He has had epidurals that were not efficacious, and does have progressive weakness. He also understands that decompressive laminectomy may not reverse his symptoms.  S/P cervical spinal fusion History of central cord syndrome post ACDF. Recurrence of right C8 distribution paresthesias. X-ray, cervical spine MRI with and without contrast.  Cervical spine looks well decompressed on MRI.  Spinal cord compression (HCC) Thoracic cord compression, progressive lower extremity weakness. Adding high-dose steroids, urgent referral to Dr. Yevette Edwards and extending MRI to include thoracic spine. ___________________________________________ [name]. [name], M.D., ABFM., CAQSM. Primary Care and Sports Medicine [facility]  Adjunct Instructor of Family Medicine  [employer]

## 2017-08-12 NOTE — Assessment & Plan Note (Addendum)
History of central cord syndrome post ACDF. Recurrence of right C8 distribution paresthesias. X-ray, cervical spine MRI with and without contrast.  Cervical spine looks well decompressed on MRI.

## 2017-08-13 LAB — BASIC METABOLIC PANEL
BUN/Creatinine Ratio: 18 (calc) (ref 6–22)
BUN: 29 mg/dL — ABNORMAL HIGH (ref 7–25)
Calcium: 10 mg/dL (ref 8.6–10.3)
Creat: 1.63 mg/dL — ABNORMAL HIGH (ref 0.70–1.11)
Potassium: 4.1 mmol/L (ref 3.5–5.3)
Sodium: 135 mmol/L (ref 135–146)

## 2017-08-13 LAB — BASIC METABOLIC PANEL WITH GFR
CO2: 26 mmol/L (ref 20–32)
Chloride: 95 mmol/L — ABNORMAL LOW (ref 98–110)
Glucose, Bld: 264 mg/dL — ABNORMAL HIGH (ref 65–99)

## 2017-08-17 ENCOUNTER — Encounter: Payer: Self-pay | Admitting: Sports Medicine

## 2017-08-17 NOTE — Telephone Encounter (Signed)
This is an attempted dump on primary care, the neurologist needs to get notes themselves from the surgeons, or the patient should contact the surgeons and have notes forwarded over.

## 2017-08-19 ENCOUNTER — Encounter: Payer: Self-pay | Admitting: Sports Medicine

## 2017-08-21 ENCOUNTER — Ambulatory Visit (HOSPITAL_BASED_OUTPATIENT_CLINIC_OR_DEPARTMENT_OTHER)
Admission: RE | Admit: 2017-08-21 | Discharge: 2017-08-21 | Disposition: A | Discharge status: Home / Self Care | Payer: Medicare FFS | Source: Ambulatory Visit | Attending: Sports Medicine | Admitting: Sports Medicine

## 2017-08-21 DIAGNOSIS — M48062 Spinal stenosis, lumbar region with neurogenic claudication: Secondary | ICD-10-CM | POA: Insufficient documentation

## 2017-08-21 DIAGNOSIS — Z981 Arthrodesis status: Secondary | ICD-10-CM | POA: Insufficient documentation

## 2017-08-21 DIAGNOSIS — G9589 Other specified diseases of spinal cord: Secondary | ICD-10-CM | POA: Diagnosis not present

## 2017-08-21 DIAGNOSIS — M5126 Other intervertebral disc displacement, lumbar region: Secondary | ICD-10-CM | POA: Insufficient documentation

## 2017-08-21 DIAGNOSIS — G9520 Unspecified cord compression: Secondary | ICD-10-CM

## 2017-08-21 DIAGNOSIS — M4807 Spinal stenosis, lumbosacral region: Secondary | ICD-10-CM

## 2017-08-21 DIAGNOSIS — M542 Cervicalgia: Secondary | ICD-10-CM | POA: Diagnosis present

## 2017-08-21 MED ORDER — GADOBENATE DIMEGLUMINE 529 MG/ML IV SOLN
10.0000 mL | Freq: Once | INTRAVENOUS | Status: AC | PRN
  Administered 2017-08-21: 10 mL via INTRAVENOUS

## 2017-08-23 ENCOUNTER — Ambulatory Visit (INDEPENDENT_AMBULATORY_CARE_PROVIDER_SITE_OTHER): Payer: Medicare FFS

## 2017-08-23 ENCOUNTER — Ambulatory Visit: Payer: Medicare FFS | Admitting: Sports Medicine

## 2017-08-23 DIAGNOSIS — M4804 Spinal stenosis, thoracic region: Secondary | ICD-10-CM

## 2017-08-23 DIAGNOSIS — G9520 Unspecified cord compression: Secondary | ICD-10-CM | POA: Insufficient documentation

## 2017-08-23 MED ORDER — PREDNISONE 10 MG (48) PO TBPK: ORAL_TABLET | Freq: Every day | ORAL | 0 refills | Status: DC

## 2017-08-23 NOTE — Addendum Note (Signed)
Addended by: Monica BectonHEKKEKANDAM, Alyssamarie Mounsey J on: 08/23/2017 09:35 AM   Modules accepted: Orders

## 2017-08-23 NOTE — Assessment & Plan Note (Signed)
Thoracic cord compression, progressive lower extremity weakness. Adding high-dose steroids, urgent referral to Dr. Yevette Edwardsumonski and extending MRI to include thoracic spine.

## 2017-08-23 NOTE — Assessment & Plan Note (Signed)
Thoracic cord compression, progressive lower extremity weakness. Adding high-dose steroids, referral to Dr. Yevette Edwardsumonski and extending MRI to include thoracic spine.

## 2017-08-30 ENCOUNTER — Other Ambulatory Visit: Payer: Self-pay | Admitting: Orthopedic Surgery

## 2017-09-02 NOTE — Pre-Procedure Instructions (Signed)
Audree CamelHoyle Cherian  09/02/2017      [facility] [place] - [address] [place] [id number] Phone: [phone] Fax: [phone]  [id number] Lorenza Evangelist- [place], [place] - [id number] AT [facility] [address] [place] [id number] Phone: [phone] Fax: [phone]    Your procedure is scheduled on Aug. 7  [facility] at 10:35 A.M.  Call this number if you have problems the morning of surgery:  [phone]   Remember:   Do not eat or drink after midnight.                       Take these medicines the morning of surgery with A SIP OF WATER :              allopurinol (zyloprim)             Amlodipine (norvasc)            Carvedilol (coreg)             Gabapentin (neurontin)            Hydralazine (apresoline)            Metolazone (zaroxolyn)            Nitroglycerine if needed               7 days prior to surgery STOP taking Aleve, Naproxen, Ibuprofen, Motrin, Advil, Goody's, BC's, all herbal medications, fish oil, and all vitamins              Follow your surgeon's instructions on when to stop Asprin.  If no instructions were given by your surgeon then you will need to call the office to get those instructions.                               Do not wear jewelry.  Do not wear lotions, powders, or perfumes, or deodorant.  Do not shave 48 hours prior to surgery.  Men may shave face and neck.  Do not bring valuables to the hospital.  [facility] is not responsible for any belongings or valuables.  Contacts, dentures or bridgework may not be worn into surgery.  Leave your suitcase in the car.  After surgery it may be brought to your room.  For patients admitted to the hospital, discharge time will be determined by your treatment team.  Patients discharged the day of surgery will not be allowed to drive home.    Special instructions:  - Preparing  For Surgery  Before surgery, you can play an important role. Because skin is not sterile, your skin needs to be as free of germs as possible. You can reduce the number of germs on your skin by washing with CHG (chlorahexidine gluconate) Soap before surgery.  CHG is an antiseptic cleaner which kills germs and bonds with the skin to continue killing germs even after washing.    Oral Hygiene is also important to reduce your risk of infection.  Remember - BRUSH YOUR TEETH THE MORNING OF SURGERY WITH YOUR REGULAR TOOTHPASTE  Please do not use if you have an allergy to CHG or antibacterial soaps. If your skin becomes reddened/irritated stop using the CHG.  Do not shave (including legs and underarms) for at least 48 hours  prior to first CHG shower. It is OK to shave your face.  Please follow these instructions carefully.   1. Shower the NIGHT BEFORE SURGERY and the MORNING OF SURGERY with CHG.   2. If you chose to wash your hair, wash your hair first as usual with your normal shampoo.  3. After you shampoo, rinse your hair and body thoroughly to remove the shampoo.  4. Use CHG as you would any other liquid soap. You can apply CHG directly to the skin and wash gently with a scrungie or a clean washcloth.   5. Apply the CHG Soap to your body ONLY FROM THE NECK DOWN.  Do not use on open wounds or open sores. Avoid contact with your eyes, ears, mouth and genitals (private parts). Wash Face and genitals (private parts)  with your normal soap.  6. Wash thoroughly, paying special attention to the area where your surgery will be performed.  7. Thoroughly rinse your body with warm water from the neck down.  8. DO NOT shower/wash with your normal soap after using and rinsing off the CHG Soap.  9. Pat yourself dry with a CLEAN TOWEL.  10. Wear CLEAN PAJAMAS to bed the night before surgery, wear comfortable clothes the morning of surgery  11. Place CLEAN SHEETS on your bed the night of your first shower  and DO NOT SLEEP WITH PETS.    Day of Surgery:  Do not apply any deodorants/lotions.  Please wear clean clothes to the hospital/surgery center.   Remember to brush your teeth WITH YOUR REGULAR TOOTHPASTE.    Please read over the following fact sheets that you were given. Coughing and Deep Breathing, MRSA Information and Surgical Site Infection Prevention

## 2017-09-03 ENCOUNTER — Other Ambulatory Visit: Payer: Self-pay

## 2017-09-03 ENCOUNTER — Ambulatory Visit (HOSPITAL_COMMUNITY)
Admission: RE | Admit: 2017-09-03 | Discharge: 2017-09-03 | Disposition: A | Discharge status: Home / Self Care | Payer: Medicare FFS | Source: Ambulatory Visit | Attending: Orthopedic Surgery | Admitting: Orthopedic Surgery

## 2017-09-03 ENCOUNTER — Encounter (HOSPITAL_COMMUNITY): Payer: Self-pay

## 2017-09-03 ENCOUNTER — Encounter (HOSPITAL_COMMUNITY)
Admission: RE | Admit: 2017-09-03 | Discharge: 2017-09-03 | Disposition: A | Discharge status: Home / Self Care | Payer: Medicare FFS | Source: Ambulatory Visit | Attending: Orthopedic Surgery | Admitting: Orthopedic Surgery

## 2017-09-03 DIAGNOSIS — Z7982 Long term (current) use of aspirin: Secondary | ICD-10-CM | POA: Insufficient documentation

## 2017-09-03 DIAGNOSIS — Z01818 Encounter for other preprocedural examination: Secondary | ICD-10-CM | POA: Diagnosis not present

## 2017-09-03 DIAGNOSIS — Z96643 Presence of artificial hip joint, bilateral: Secondary | ICD-10-CM | POA: Diagnosis not present

## 2017-09-03 DIAGNOSIS — Z87891 Personal history of nicotine dependence: Secondary | ICD-10-CM | POA: Diagnosis not present

## 2017-09-03 DIAGNOSIS — Z96652 Presence of left artificial knee joint: Secondary | ICD-10-CM | POA: Insufficient documentation

## 2017-09-03 DIAGNOSIS — I130 Hypertensive heart and chronic kidney disease with heart failure and stage 1 through stage 4 chronic kidney disease, or unspecified chronic kidney disease: Secondary | ICD-10-CM | POA: Diagnosis not present

## 2017-09-03 DIAGNOSIS — R531 Weakness: Secondary | ICD-10-CM | POA: Diagnosis not present

## 2017-09-03 DIAGNOSIS — G4733 Obstructive sleep apnea (adult) (pediatric): Secondary | ICD-10-CM | POA: Diagnosis not present

## 2017-09-03 DIAGNOSIS — H3530 Unspecified macular degeneration: Secondary | ICD-10-CM | POA: Diagnosis not present

## 2017-09-03 DIAGNOSIS — N189 Chronic kidney disease, unspecified: Secondary | ICD-10-CM | POA: Diagnosis not present

## 2017-09-03 DIAGNOSIS — I5030 Unspecified diastolic (congestive) heart failure: Secondary | ICD-10-CM | POA: Diagnosis not present

## 2017-09-03 DIAGNOSIS — Z01812 Encounter for preprocedural laboratory examination: Secondary | ICD-10-CM | POA: Insufficient documentation

## 2017-09-03 DIAGNOSIS — Z0183 Encounter for blood typing: Secondary | ICD-10-CM | POA: Insufficient documentation

## 2017-09-03 DIAGNOSIS — Z791 Long term (current) use of non-steroidal anti-inflammatories (NSAID): Secondary | ICD-10-CM | POA: Diagnosis not present

## 2017-09-03 DIAGNOSIS — Z79899 Other long term (current) drug therapy: Secondary | ICD-10-CM | POA: Insufficient documentation

## 2017-09-03 HISTORY — DX: Other symptoms and signs involving the musculoskeletal system: R29.898

## 2017-09-03 LAB — CBC WITH DIFFERENTIAL/PLATELET
ABS IMMATURE GRANULOCYTES: 0.4 10*3/uL — AB (ref 0.0–0.1)
BASOS ABS: 0.1 10*3/uL (ref 0.0–0.1)
Basophils Relative: 0 %
Eosinophils Absolute: 0.2 10*3/uL (ref 0.0–0.7)
Eosinophils Relative: 1 %
HCT: 49.7 % (ref 39.0–52.0)
HEMOGLOBIN: 16.5 g/dL (ref 13.0–17.0)
IMMATURE GRANULOCYTES: 2 %
LYMPHS PCT: 11 %
Lymphs Abs: 2.6 10*3/uL (ref 0.7–4.0)
MCH: 31 pg (ref 26.0–34.0)
MCHC: 33.2 g/dL (ref 30.0–36.0)
MCV: 93.4 fL (ref 78.0–100.0)
Monocytes Absolute: 2.2 10*3/uL — ABNORMAL HIGH (ref 0.1–1.0)
Monocytes Relative: 9 %
NEUTROS ABS: 18.5 10*3/uL — AB (ref 1.7–7.7)
NEUTROS PCT: 77 %
Platelets: 124 10*3/uL — ABNORMAL LOW (ref 150–400)
RBC: 5.32 MIL/uL (ref 4.22–5.81)
RDW: 14.8 % (ref 11.5–15.5)
WBC: 23.9 10*3/uL — AB (ref 4.0–10.5)

## 2017-09-03 LAB — COMPREHENSIVE METABOLIC PANEL
ALBUMIN: 4 g/dL (ref 3.5–5.0)
ALK PHOS: 47 U/L (ref 38–126)
ALT: 26 U/L (ref 0–44)
ANION GAP: 15 (ref 5–15)
AST: 23 U/L (ref 15–41)
BUN: 47 mg/dL — ABNORMAL HIGH (ref 8–23)
CALCIUM: 9.3 mg/dL (ref 8.9–10.3)
CO2: 24 mmol/L (ref 22–32)
Chloride: 93 mmol/L — ABNORMAL LOW (ref 98–111)
Creatinine, Ser: 1.81 mg/dL — ABNORMAL HIGH (ref 0.61–1.24)
GFR calc Af Amer: 39 mL/min — ABNORMAL LOW (ref >60)
GFR calc non Af Amer: 34 mL/min — ABNORMAL LOW (ref >60)
GLUCOSE: 277 mg/dL — AB (ref 70–99)
Potassium: 4.5 mmol/L (ref 3.5–5.1)
SODIUM: 132 mmol/L — AB (ref 135–145)
Total Bilirubin: 1.1 mg/dL (ref 0.3–1.2)
Total Protein: 7 g/dL (ref 6.5–8.1)

## 2017-09-03 LAB — PROTIME-INR
INR: 1.01
Prothrombin Time: 13.2 seconds (ref 11.4–15.2)

## 2017-09-03 LAB — SURGICAL PCR SCREEN
MRSA, PCR: NEGATIVE
Staphylococcus aureus: NEGATIVE

## 2017-09-03 LAB — URINALYSIS, ROUTINE W REFLEX MICROSCOPIC
Bilirubin Urine: NEGATIVE
Glucose, UA: 150 mg/dL — AB
HGB URINE DIPSTICK: NEGATIVE
Ketones, ur: NEGATIVE mg/dL
Leukocytes, UA: NEGATIVE
Nitrite: NEGATIVE
Protein, ur: NEGATIVE mg/dL
Specific Gravity, Urine: 1.017 (ref 1.005–1.030)
pH: 6 (ref 5.0–8.0)

## 2017-09-03 LAB — TYPE AND SCREEN
ABO/RH(D): A POS
ANTIBODY SCREEN: NEGATIVE

## 2017-09-03 LAB — APTT: aPTT: 26 seconds (ref 24–36)

## 2017-09-03 NOTE — Progress Notes (Signed)
PCP - Dr. Rodney Langtonhomas Thekkekandam  Cardiologist - Dr. Rosezella RumpfKatherine Lawyer  Chest x-ray - 09/03/17  EKG - 03/15/17 (E)  Stress Test - 7 yrs- neg  ECHO - 03/22/17 (E)  Cardiac Cath - 06/10/12 (E)  Sleep Study - Yes- Positive CPAP - Yes  LABS- 09/03/17: CBC w/D, CMP, PT, PTT, T/S, UA, PCR  ASA- LD-8/1   Anesthesia- Yes- EKG  Pt denies having chest pain, sob, or fever at this time. All instructions explained to the pt, with a verbal understanding of the material. Pt agrees to go over the instructions while at home for a better understanding. The opportunity to ask questions was provided.

## 2017-09-06 NOTE — Progress Notes (Signed)
Anesthesia Chart Review:  Case:  [id number] Date/Time:  09/08/17 1220   Procedure:  THORACIC 11-12 DECOMPRESSION (N/A )   Anesthesia type:  General   Pre-op diagnosis:  BILATERAL EXTREMITY WEAKNESS   [facility]:  [facility] ROOM 05 / [facility]   Surgeon:  Estill Bamberg, MD      DISCUSSION: 82 yo male former smoker for above procedure. Pertinent hx includes HTN, OSA on CPAP, Anasarca, CKD. Diastolic CHF (cath in 2014 revealing normal coronaries and EF 60%).  Pt with elevated WBC at 23.9 on preop labs. CXR and UA done 09/03/2017 both unremarkable. Pt denied illness at PAT appointment. He was recently placed on high dose steroids by PCP for thoracic cord compression and this is likely contributing. Results were called to Dr. Marshell Levan office. Requested repeat CBC on DOS which has been ordered.  History of CKD with elevated creatinine with values ranging 1.51-2.14 over the past 6 months. Appears stable.  Anticipate he can proceed with surgery as scheduled barring acute status change.  VS: BP 131/66   Pulse 63   Temp 36.5 C   Resp 20   Ht 5\' 9"  (1.753 m)   Wt (!) 322 lb (146.1 kg)   SpO2 95%   BMI 47.55 kg/m    PROVIDERS: Monica Becton, MD is PCP last seen 08/12/2017  Olga Millers is Cardiologist, last seen in office by Joni Reining NP 04/26/2017.    LABS: Leukocytosis, likely steroid induced. Elevated BG, also possibly steroid related. Pt denied illness. Clean UA and unremarkable CXR. Notified Dr. Marshell Levan office of results. Requested repeat CBC on DOS which has been ordered. (all labs ordered are listed, but only abnormal results are displayed)  Labs Reviewed  CBC WITH DIFFERENTIAL/PLATELET - Abnormal; Notable for the following components:      Result Value   WBC 23.9 (*)    Platelets 124 (*)    Neutro Abs 18.5 (*)    Monocytes Absolute 2.2 (*)    Abs Immature Granulocytes 0.4 (*)    All other components within normal limits  COMPREHENSIVE METABOLIC PANEL - Abnormal;  Notable for the following components:   Sodium 132 (*)    Chloride 93 (*)    Glucose, Bld 277 (*)    BUN 47 (*)    Creatinine, Ser 1.81 (*)    GFR calc non Af Amer 34 (*)    GFR calc Af Amer 39 (*)    All other components within normal limits  URINALYSIS, ROUTINE W REFLEX MICROSCOPIC - Abnormal; Notable for the following components:   Glucose, UA 150 (*)    All other components within normal limits  SURGICAL PCR SCREEN  APTT  PROTIME-INR  TYPE AND SCREEN   Lab Results  Component Value Date   HGBA1C 6.6 (H) 05/11/2017    IMAGES: CHEST - 2 VIEW 09/03/2017  COMPARISON:  05/07/2015  FINDINGS: The heart is normal in size allowing for low volumes. Lungs are under aerated with bibasilar atelectasis. Hemidiaphragms are chronically lobulated bilaterally. No pneumothorax or pleural effusion.  IMPRESSION: No active cardiopulmonary disease.  EKG: 03/15/2017: Sinus bradycardia with first-degree block and premature atrial complexes. Left axis deviation  CV: Echo 03/22/2017: Study Conclusions  - Left ventricle: The cavity size was normal. There was mild   concentric hypertrophy. Systolic function was normal. The   estimated ejection fraction was in the range of 60% to 65%. Wall   motion was normal; there were no regional wall motion   abnormalities. Features are consistent  with a pseudonormal left   ventricular filling pattern, with concomitant abnormal relaxation   and increased filling pressure (grade 2 diastolic dysfunction). - Mitral valve: Calcified annulus. - Left atrium: The atrium was severely dilated. - Right atrium: The atrium was mildly dilated.   Cath 06/10/2012: Coronary Arteries: Right dominant with no anomalies  LM: Normal  LAD: Normal                          D1: nornal             D2 nornal             D3: normal  Circumflex: nornal              OM1: nornal             OM2: normal  RCA: normal              PDA: normal             PLA:  normal  Ventriculography: EF: 60 %, no RWMA;s  Hemodynamics:             Aortic Pressure: 125 69  mmHg             LV Pressure: 125 14  mmHg  Impression:  No signficant CAD  F/U primary  Past Medical History:  Diagnosis Date  . Arthritis   . Cataract   . Hypertension    takes Amlodipine,Lisinopril,and Metoprolol daily  . Lower extremity weakness    Bilateral  . Macular degeneration, left eye   . Sleep apnea    2008.Marland KitchenMarland KitchenWEARS CPAP    Past Surgical History:  Procedure Laterality Date  . BACK SURGERY    . CARDIAC CATHETERIZATION  06-10-12  . COLONOSCOPY    . EYE SURGERY     CATARACT RIGHT EYE  . KNEE SURGERY    . LUMBAR LAMINECTOMY/DECOMPRESSION MICRODISCECTOMY N/A 11/02/2012   Procedure: LUMBAR LAMINECTOMY/DECOMPRESSION MICRODISCECTOMY 2 LEVEL;  Surgeon: Emilee Hero, MD;  Location: [facility] OR;  Service: Orthopedics;  Laterality: N/A;  Lumbar 4-5,lumbar 5-sacrum 1 decompression  . TONSILLECTOMY    . TOTAL HIP ARTHROPLASTY Left 01/06/2013   Procedure: LEFT TOTAL HIP ARTHROPLASTY-Posterior approach;  Surgeon: Harvie Junior, MD;  Location: [facility] OR;  Service: Orthopedics;  Laterality: Left;  . TOTAL HIP ARTHROPLASTY Right 04/21/2013   Procedure: TOTAL HIP ARTHROPLASTY ANTERIOR APPROACH;  Surgeon: Harvie Junior, MD;  [facility];  Service: Orthopedics;  Laterality: Right;  . TOTAL KNEE ARTHROPLASTY Left 09/16/2012   Procedure: TOTAL KNEE ARTHROPLASTY;  Surgeon: Harvie Junior, MD;  Location: [facility] OR;  Service: Orthopedics;  Laterality: Left;    MEDICATIONS: . allopurinol (ZYLOPRIM) 300 MG tablet  . AMBULATORY NON FORMULARY MEDICATION  . amLODipine (NORVASC) 10 MG tablet  . aspirin 81 MG tablet  . atorvastatin (LIPITOR) 40 MG tablet  . carvedilol (COREG) 25 MG tablet  . gabapentin (NEURONTIN) 600 MG tablet  . hydrALAZINE (APRESOLINE) 25 MG tablet  . meloxicam (MOBIC) 15 MG tablet  . metolazone (ZAROXOLYN) 5 MG tablet  . nitroGLYCERIN (NITROSTAT) 0.4 MG SL tablet  .  Potassium Chloride ER 20 MEQ TBCR  . predniSONE (STERAPRED UNI-PAK 48 TAB) 10 MG (48) TBPK tablet  . spironolactone (ALDACTONE) 25 MG tablet  . torsemide (DEMADEX) 20 MG tablet  . traZODone (DESYREL) 50 MG tablet   No current facility-administered medications for this encounter.     Antionette Poles, PA-C [facility]  Short Stay Center/Anesthesiology Phone [phone] 09/06/2017 11:20 AM

## 2017-09-07 MED ORDER — DEXTROSE 5 % IV SOLN
3.0000 g | INTRAVENOUS | Status: AC
  Administered 2017-09-08: 3 g via INTRAVENOUS
  Filled 2017-09-07: qty 3

## 2017-09-08 ENCOUNTER — Encounter (HOSPITAL_COMMUNITY): Payer: Self-pay

## 2017-09-08 ENCOUNTER — Ambulatory Visit (HOSPITAL_COMMUNITY)
Admission: RE | Disposition: A | Discharge status: Home / Self Care | Payer: Self-pay | Source: Ambulatory Visit | Attending: Orthopedic Surgery

## 2017-09-08 ENCOUNTER — Ambulatory Visit (HOSPITAL_COMMUNITY): Payer: Medicare FFS

## 2017-09-08 ENCOUNTER — Ambulatory Visit (HOSPITAL_COMMUNITY): Payer: Medicare FFS | Admitting: Physician Assistant

## 2017-09-08 ENCOUNTER — Ambulatory Visit (HOSPITAL_COMMUNITY)
Admission: RE | Admit: 2017-09-08 | Discharge: 2017-09-09 | Disposition: A | Discharge status: Home / Self Care | Payer: Medicare FFS | Source: Ambulatory Visit | Attending: Orthopedic Surgery | Admitting: Orthopedic Surgery

## 2017-09-08 DIAGNOSIS — Z96652 Presence of left artificial knee joint: Secondary | ICD-10-CM | POA: Insufficient documentation

## 2017-09-08 DIAGNOSIS — M109 Gout, unspecified: Secondary | ICD-10-CM | POA: Diagnosis not present

## 2017-09-08 DIAGNOSIS — Z87891 Personal history of nicotine dependence: Secondary | ICD-10-CM | POA: Diagnosis not present

## 2017-09-08 DIAGNOSIS — G9589 Other specified diseases of spinal cord: Secondary | ICD-10-CM | POA: Insufficient documentation

## 2017-09-08 DIAGNOSIS — I10 Essential (primary) hypertension: Secondary | ICD-10-CM | POA: Insufficient documentation

## 2017-09-08 DIAGNOSIS — Z96643 Presence of artificial hip joint, bilateral: Secondary | ICD-10-CM | POA: Insufficient documentation

## 2017-09-08 DIAGNOSIS — E669 Obesity, unspecified: Secondary | ICD-10-CM | POA: Diagnosis not present

## 2017-09-08 DIAGNOSIS — Z6841 Body Mass Index (BMI) 40.0 and over, adult: Secondary | ICD-10-CM | POA: Insufficient documentation

## 2017-09-08 DIAGNOSIS — Z79899 Other long term (current) drug therapy: Secondary | ICD-10-CM | POA: Insufficient documentation

## 2017-09-08 DIAGNOSIS — Z7982 Long term (current) use of aspirin: Secondary | ICD-10-CM | POA: Insufficient documentation

## 2017-09-08 DIAGNOSIS — M1990 Unspecified osteoarthritis, unspecified site: Secondary | ICD-10-CM | POA: Insufficient documentation

## 2017-09-08 DIAGNOSIS — G9520 Unspecified cord compression: Secondary | ICD-10-CM | POA: Insufficient documentation

## 2017-09-08 DIAGNOSIS — G959 Disease of spinal cord, unspecified: Secondary | ICD-10-CM | POA: Diagnosis present

## 2017-09-08 DIAGNOSIS — G4730 Sleep apnea, unspecified: Secondary | ICD-10-CM | POA: Insufficient documentation

## 2017-09-08 DIAGNOSIS — M5410 Radiculopathy, site unspecified: Secondary | ICD-10-CM

## 2017-09-08 HISTORY — PX: LUMBAR LAMINECTOMY/DECOMPRESSION MICRODISCECTOMY: SHX5026

## 2017-09-08 LAB — CBC
HCT: 44.4 % (ref 39.0–52.0)
Hemoglobin: 15 g/dL (ref 13.0–17.0)
MCH: 31.6 pg (ref 26.0–34.0)
MCHC: 33.8 g/dL (ref 30.0–36.0)
MCV: 93.5 fL (ref 78.0–100.0)
PLATELETS: 107 10*3/uL — AB (ref 150–400)
RBC: 4.75 MIL/uL (ref 4.22–5.81)
RDW: 14.6 % (ref 11.5–15.5)
WBC: 17 10*3/uL — AB (ref 4.0–10.5)

## 2017-09-08 SURGERY — LUMBAR LAMINECTOMY/DECOMPRESSION MICRODISCECTOMY
Anesthesia: General | Site: Back

## 2017-09-08 MED ORDER — FENTANYL CITRATE (PF) 100 MCG/2ML IJ SOLN
INTRAMUSCULAR | Status: DC | PRN
  Administered 2017-09-08: 150 ug via INTRAVENOUS
  Administered 2017-09-08: 50 ug via INTRAVENOUS

## 2017-09-08 MED ORDER — MIDAZOLAM HCL 2 MG/2ML IJ SOLN
INTRAMUSCULAR | Status: AC
  Filled 2017-09-08: qty 2

## 2017-09-08 MED ORDER — GABAPENTIN 600 MG PO TABS
600.0000 mg | ORAL_TABLET | Freq: Three times a day (TID) | ORAL | Status: DC
  Administered 2017-09-08 – 2017-09-09 (×2): 600 mg via ORAL
  Filled 2017-09-08 (×2): qty 1

## 2017-09-08 MED ORDER — ACETAMINOPHEN 650 MG RE SUPP: 650.0000 mg | RECTAL | Status: DC | PRN

## 2017-09-08 MED ORDER — PROPOFOL 10 MG/ML IV BOLUS
INTRAVENOUS | Status: DC | PRN
  Administered 2017-09-08: 160 mg via INTRAVENOUS

## 2017-09-08 MED ORDER — METHYLENE BLUE 0.5 % INJ SOLN
INTRAVENOUS | Status: DC | PRN
  Administered 2017-09-08: .5 mL via INTRADERMAL

## 2017-09-08 MED ORDER — SPIRONOLACTONE 25 MG PO TABS
25.0000 mg | ORAL_TABLET | Freq: Every day | ORAL | Status: DC
  Administered 2017-09-09: 25 mg via ORAL
  Filled 2017-09-08 (×2): qty 1

## 2017-09-08 MED ORDER — POTASSIUM CHLORIDE IN NACL 20-0.9 MEQ/L-% IV SOLN: INTRAVENOUS | Status: DC

## 2017-09-08 MED ORDER — SUGAMMADEX SODIUM 200 MG/2ML IV SOLN
INTRAVENOUS | Status: DC | PRN
  Administered 2017-09-08: 300 mg via INTRAVENOUS

## 2017-09-08 MED ORDER — ONDANSETRON HCL 4 MG/2ML IJ SOLN
INTRAMUSCULAR | Status: AC
  Filled 2017-09-08: qty 2

## 2017-09-08 MED ORDER — FENTANYL CITRATE (PF) 100 MCG/2ML IJ SOLN: 25.0000 ug | INTRAMUSCULAR | Status: DC | PRN

## 2017-09-08 MED ORDER — POVIDONE-IODINE 7.5 % EX SOLN
Freq: Once | CUTANEOUS | Status: DC
  Filled 2017-09-08: qty 118

## 2017-09-08 MED ORDER — OXYCODONE HCL 5 MG PO TABS
5.0000 mg | ORAL_TABLET | ORAL | Status: DC | PRN
  Administered 2017-09-08 – 2017-09-09 (×2): 5 mg via ORAL
  Filled 2017-09-08 (×2): qty 1

## 2017-09-08 MED ORDER — ALUM & MAG HYDROXIDE-SIMETH 200-200-20 MG/5ML PO SUSP: 30.0000 mL | Freq: Four times a day (QID) | ORAL | Status: DC | PRN

## 2017-09-08 MED ORDER — ROCURONIUM BROMIDE 10 MG/ML (PF) SYRINGE
PREFILLED_SYRINGE | INTRAVENOUS | Status: DC | PRN
  Administered 2017-09-08: 50 mg via INTRAVENOUS

## 2017-09-08 MED ORDER — ZOLPIDEM TARTRATE 5 MG PO TABS: 5.0000 mg | ORAL_TABLET | Freq: Every evening | ORAL | Status: DC | PRN

## 2017-09-08 MED ORDER — ALLOPURINOL 300 MG PO TABS
300.0000 mg | ORAL_TABLET | Freq: Two times a day (BID) | ORAL | Status: DC
  Administered 2017-09-08 – 2017-09-09 (×2): 300 mg via ORAL
  Filled 2017-09-08 (×2): qty 1

## 2017-09-08 MED ORDER — MENTHOL 3 MG MT LOZG: 1.0000 | LOZENGE | OROMUCOSAL | Status: DC | PRN

## 2017-09-08 MED ORDER — SODIUM CHLORIDE 0.9 % IV SOLN: 250.0000 mL | INTRAVENOUS | Status: DC

## 2017-09-08 MED ORDER — CARVEDILOL 25 MG PO TABS
25.0000 mg | ORAL_TABLET | Freq: Two times a day (BID) | ORAL | Status: DC
  Administered 2017-09-09: 25 mg via ORAL
  Filled 2017-09-08: qty 1

## 2017-09-08 MED ORDER — LIDOCAINE 2% (20 MG/ML) 5 ML SYRINGE
INTRAMUSCULAR | Status: AC
  Filled 2017-09-08: qty 5

## 2017-09-08 MED ORDER — 0.9 % SODIUM CHLORIDE (POUR BTL) OPTIME
TOPICAL | Status: DC | PRN
  Administered 2017-09-08 (×2): 1000 mL

## 2017-09-08 MED ORDER — ONDANSETRON HCL 4 MG/2ML IJ SOLN: 4.0000 mg | Freq: Four times a day (QID) | INTRAMUSCULAR | Status: DC | PRN

## 2017-09-08 MED ORDER — HEMOSTATIC AGENTS (NO CHARGE) OPTIME
TOPICAL | Status: DC | PRN
  Administered 2017-09-08: 1 via TOPICAL

## 2017-09-08 MED ORDER — PROPOFOL 10 MG/ML IV BOLUS
INTRAVENOUS | Status: AC
  Filled 2017-09-08: qty 40

## 2017-09-08 MED ORDER — METHYLENE BLUE 0.5 % INJ SOLN
INTRAVENOUS | Status: AC
  Filled 2017-09-08: qty 10

## 2017-09-08 MED ORDER — ONDANSETRON HCL 4 MG PO TABS: 4.0000 mg | ORAL_TABLET | Freq: Four times a day (QID) | ORAL | Status: DC | PRN

## 2017-09-08 MED ORDER — LIDOCAINE 2% (20 MG/ML) 5 ML SYRINGE
INTRAMUSCULAR | Status: DC | PRN
  Administered 2017-09-08: 90 mg via INTRAVENOUS

## 2017-09-08 MED ORDER — NITROGLYCERIN 0.4 MG SL SUBL: 0.4000 mg | SUBLINGUAL_TABLET | SUBLINGUAL | Status: DC | PRN

## 2017-09-08 MED ORDER — DEXAMETHASONE SODIUM PHOSPHATE 10 MG/ML IJ SOLN
INTRAMUSCULAR | Status: AC
  Filled 2017-09-08: qty 1

## 2017-09-08 MED ORDER — CEFAZOLIN SODIUM-DEXTROSE 1-4 GM/50ML-% IV SOLN
1.0000 g | Freq: Three times a day (TID) | INTRAVENOUS | Status: AC
  Administered 2017-09-09: 1 g via INTRAVENOUS
  Filled 2017-09-08: qty 50

## 2017-09-08 MED ORDER — ATORVASTATIN CALCIUM 20 MG PO TABS: 20.0000 mg | ORAL_TABLET | Freq: Every day | ORAL | Status: DC

## 2017-09-08 MED ORDER — THROMBIN (RECOMBINANT) 20000 UNITS EX SOLR
CUTANEOUS | Status: AC
  Filled 2017-09-08: qty 20000

## 2017-09-08 MED ORDER — SODIUM CHLORIDE 0.9% FLUSH
3.0000 mL | Freq: Two times a day (BID) | INTRAVENOUS | Status: DC
  Administered 2017-09-08: 3 mL via INTRAVENOUS

## 2017-09-08 MED ORDER — ASPIRIN EC 81 MG PO TBEC
81.0000 mg | DELAYED_RELEASE_TABLET | Freq: Every day | ORAL | Status: DC
  Administered 2017-09-09: 81 mg via ORAL
  Filled 2017-09-08: qty 1

## 2017-09-08 MED ORDER — AMLODIPINE BESYLATE 5 MG PO TABS
10.0000 mg | ORAL_TABLET | Freq: Every day | ORAL | Status: DC
  Administered 2017-09-09: 10 mg via ORAL
  Filled 2017-09-08: qty 2

## 2017-09-08 MED ORDER — DIAZEPAM 5 MG PO TABS: 5.0000 mg | ORAL_TABLET | Freq: Four times a day (QID) | ORAL | Status: DC | PRN

## 2017-09-08 MED ORDER — ONDANSETRON HCL 4 MG/2ML IJ SOLN
INTRAMUSCULAR | Status: DC | PRN
  Administered 2017-09-08: 4 mg via INTRAVENOUS

## 2017-09-08 MED ORDER — BUPIVACAINE-EPINEPHRINE 0.25% -1:200000 IJ SOLN
INTRAMUSCULAR | Status: DC | PRN
  Administered 2017-09-08: 20 mL
  Administered 2017-09-08: 10 mL

## 2017-09-08 MED ORDER — METOLAZONE 5 MG PO TABS
5.0000 mg | ORAL_TABLET | Freq: Every day | ORAL | Status: DC
  Administered 2017-09-08 – 2017-09-09 (×2): 5 mg via ORAL
  Filled 2017-09-08 (×2): qty 1

## 2017-09-08 MED ORDER — ROCURONIUM BROMIDE 10 MG/ML (PF) SYRINGE
PREFILLED_SYRINGE | INTRAVENOUS | Status: AC
  Filled 2017-09-08: qty 10

## 2017-09-08 MED ORDER — SODIUM CHLORIDE 0.9% FLUSH: 3.0000 mL | INTRAVENOUS | Status: DC | PRN

## 2017-09-08 MED ORDER — TORSEMIDE 20 MG PO TABS
40.0000 mg | ORAL_TABLET | Freq: Two times a day (BID) | ORAL | Status: DC
  Filled 2017-09-08 (×3): qty 2

## 2017-09-08 MED ORDER — ACETAMINOPHEN 325 MG PO TABS
650.0000 mg | ORAL_TABLET | ORAL | Status: DC | PRN
  Administered 2017-09-09: 650 mg via ORAL
  Filled 2017-09-08: qty 2

## 2017-09-08 MED ORDER — BUPIVACAINE LIPOSOME 1.3 % IJ SUSP
20.0000 mL | INTRAMUSCULAR | Status: DC
  Filled 2017-09-08: qty 20

## 2017-09-08 MED ORDER — POTASSIUM CHLORIDE CRYS ER 20 MEQ PO TBCR
20.0000 meq | EXTENDED_RELEASE_TABLET | Freq: Every day | ORAL | Status: DC
  Administered 2017-09-09: 20 meq via ORAL
  Filled 2017-09-08: qty 2
  Filled 2017-09-08: qty 1

## 2017-09-08 MED ORDER — TRAZODONE HCL 50 MG PO TABS
50.0000 mg | ORAL_TABLET | Freq: Every day | ORAL | Status: DC
  Administered 2017-09-08: 50 mg via ORAL
  Filled 2017-09-08: qty 1

## 2017-09-08 MED ORDER — FENTANYL CITRATE (PF) 250 MCG/5ML IJ SOLN
INTRAMUSCULAR | Status: AC
  Filled 2017-09-08: qty 5

## 2017-09-08 MED ORDER — BUPIVACAINE-EPINEPHRINE (PF) 0.25% -1:200000 IJ SOLN
INTRAMUSCULAR | Status: AC
  Filled 2017-09-08: qty 30

## 2017-09-08 MED ORDER — HYDRALAZINE HCL 10 MG PO TABS
25.0000 mg | ORAL_TABLET | Freq: Two times a day (BID) | ORAL | Status: DC
  Administered 2017-09-08 – 2017-09-09 (×2): 25 mg via ORAL
  Filled 2017-09-08 (×2): qty 3

## 2017-09-08 MED ORDER — THROMBIN 20000 UNITS EX SOLR
CUTANEOUS | Status: DC | PRN
  Administered 2017-09-08: 13:00:00 via TOPICAL

## 2017-09-08 MED ORDER — BUPIVACAINE LIPOSOME 1.3 % IJ SUSP
INTRAMUSCULAR | Status: DC | PRN
  Administered 2017-09-08: 20 mL

## 2017-09-08 MED ORDER — METHYLPREDNISOLONE ACETATE 40 MG/ML IJ SUSP
INTRAMUSCULAR | Status: AC
  Filled 2017-09-08: qty 1

## 2017-09-08 MED ORDER — LACTATED RINGERS IV SOLN
INTRAVENOUS | Status: DC
  Administered 2017-09-08: 14:00:00 via INTRAVENOUS
  Administered 2017-09-08: 10 mL/h via INTRAVENOUS

## 2017-09-08 MED ORDER — PHENOL 1.4 % MT LIQD: 1.0000 | OROMUCOSAL | Status: DC | PRN

## 2017-09-08 SURGICAL SUPPLY — 73 items
BENZOIN TINCTURE PRP APPL 2/3 (GAUZE/BANDAGES/DRESSINGS) ×3 IMPLANT
BUR PRECISION FLUTE 5.0 (BURR) IMPLANT
CABLE BIPOLOR RESECTION CORD (MISCELLANEOUS) ×3 IMPLANT
CARTRIDGE OIL MAESTRO DRILL (MISCELLANEOUS) IMPLANT
CLOSURE WOUND 1/2 X4 (GAUZE/BANDAGES/DRESSINGS) ×1
COVER SURGICAL LIGHT HANDLE (MISCELLANEOUS) ×3 IMPLANT
DIFFUSER DRILL AIR PNEUMATIC (MISCELLANEOUS) IMPLANT
DRAIN CHANNEL 15F RND FF W/TCR (WOUND CARE) IMPLANT
DRAPE POUCH INSTRU U-SHP 10X18 (DRAPES) ×6 IMPLANT
DRAPE SURG 17X23 STRL (DRAPES) ×12 IMPLANT
DURAPREP 26ML APPLICATOR (WOUND CARE) ×3 IMPLANT
ELECT BLADE 4.0 EZ CLEAN MEGAD (MISCELLANEOUS) ×3
ELECT CAUTERY BLADE 6.4 (BLADE) ×3 IMPLANT
ELECT REM PT RETURN 9FT ADLT (ELECTROSURGICAL) ×3
ELECTRODE BLDE 4.0 EZ CLN MEGD (MISCELLANEOUS) ×1 IMPLANT
ELECTRODE REM PT RTRN 9FT ADLT (ELECTROSURGICAL) ×1 IMPLANT
EVACUATOR SILICONE 100CC (DRAIN) IMPLANT
FILTER STRAW FLUID ASPIR (MISCELLANEOUS) ×3 IMPLANT
FLOSEAL 5ML (HEMOSTASIS) ×3 IMPLANT
GAUZE 4X4 16PLY RFD (DISPOSABLE) ×3 IMPLANT
GAUZE SPONGE 4X4 12PLY STRL (GAUZE/BANDAGES/DRESSINGS) ×3 IMPLANT
GLOVE BIO SURGEON STRL SZ7 (GLOVE) ×3 IMPLANT
GLOVE BIO SURGEON STRL SZ8 (GLOVE) ×3 IMPLANT
GLOVE BIOGEL PI IND STRL 7.0 (GLOVE) ×1 IMPLANT
GLOVE BIOGEL PI IND STRL 8 (GLOVE) ×1 IMPLANT
GLOVE BIOGEL PI INDICATOR 7.0 (GLOVE) ×2
GLOVE BIOGEL PI INDICATOR 8 (GLOVE) ×2
GOWN STRL REUS W/ TWL LRG LVL3 (GOWN DISPOSABLE) ×1 IMPLANT
GOWN STRL REUS W/ TWL XL LVL3 (GOWN DISPOSABLE) ×2 IMPLANT
GOWN STRL REUS W/TWL LRG LVL3 (GOWN DISPOSABLE) ×2
GOWN STRL REUS W/TWL XL LVL3 (GOWN DISPOSABLE) ×4
IV CATH 14GX2 1/4 (CATHETERS) ×6 IMPLANT
KIT BASIN OR (CUSTOM PROCEDURE TRAY) ×3 IMPLANT
KIT POSITION SURG JACKSON T1 (MISCELLANEOUS) ×3 IMPLANT
KIT TURNOVER KIT B (KITS) ×3 IMPLANT
MANIFOLD NEPTUNE WASTE (CANNULA) ×3 IMPLANT
NEEDLE 18GX1X1/2 (RX/OR ONLY) (NEEDLE) ×3 IMPLANT
NEEDLE 22X1 1/2 (OR ONLY) (NEEDLE) ×3 IMPLANT
NEEDLE HYPO 25GX1X1/2 BEV (NEEDLE) ×3 IMPLANT
NEEDLE SPNL 18GX3.5 QUINCKE PK (NEEDLE) ×9 IMPLANT
NS IRRIG 1000ML POUR BTL (IV SOLUTION) ×3 IMPLANT
OIL CARTRIDGE MAESTRO DRILL (MISCELLANEOUS)
PACK LAMINECTOMY ORTHO (CUSTOM PROCEDURE TRAY) ×3 IMPLANT
PACK UNIVERSAL I (CUSTOM PROCEDURE TRAY) ×3 IMPLANT
PAD ARMBOARD 7.5X6 YLW CONV (MISCELLANEOUS) ×6 IMPLANT
PATTIES SURGICAL .5 X.5 (GAUZE/BANDAGES/DRESSINGS) IMPLANT
PATTIES SURGICAL .5 X1 (DISPOSABLE) ×3 IMPLANT
SPONGE INTESTINAL PEANUT (DISPOSABLE) ×3 IMPLANT
SPONGE SURGIFOAM ABS GEL 100 (HEMOSTASIS) ×3 IMPLANT
SPONGE SURGIFOAM ABS GEL SZ50 (HEMOSTASIS) IMPLANT
STRIP CLOSURE SKIN 1/2X4 (GAUZE/BANDAGES/DRESSINGS) ×2 IMPLANT
SUCTION FRAZIER TIP 8 FR DISP (SUCTIONS) ×2
SUCTION TUBE FRAZIER 8FR DISP (SUCTIONS) ×1 IMPLANT
SURGIFLO W/THROMBIN 8M KIT (HEMOSTASIS) IMPLANT
SUT MNCRL AB 4-0 PS2 18 (SUTURE) ×3 IMPLANT
SUT VIC AB 0 CT1 18XCR BRD 8 (SUTURE) ×1 IMPLANT
SUT VIC AB 0 CT1 27 (SUTURE)
SUT VIC AB 0 CT1 27XBRD ANBCTR (SUTURE) IMPLANT
SUT VIC AB 0 CT1 8-18 (SUTURE) ×2
SUT VIC AB 1 CT1 18XCR BRD 8 (SUTURE) ×2 IMPLANT
SUT VIC AB 1 CT1 8-18 (SUTURE) ×4
SUT VIC AB 2-0 CT2 18 VCP726D (SUTURE) ×6 IMPLANT
SYR 20CC LL (SYRINGE) ×3 IMPLANT
SYR BULB IRRIGATION 50ML (SYRINGE) ×3 IMPLANT
SYR CONTROL 10ML LL (SYRINGE) ×6 IMPLANT
SYR TB 1ML 26GX3/8 SAFETY (SYRINGE) IMPLANT
SYR TB 1ML LUER SLIP (SYRINGE) ×6 IMPLANT
SYRINGE 20CC LL (MISCELLANEOUS) ×2 IMPLANT
TAPE CLOTH SURG 4X10 WHT LF (GAUZE/BANDAGES/DRESSINGS) ×3 IMPLANT
TOWEL GREEN STERILE (TOWEL DISPOSABLE) ×3 IMPLANT
TOWEL GREEN STERILE FF (TOWEL DISPOSABLE) ×3 IMPLANT
WATER STERILE IRR 1000ML POUR (IV SOLUTION) IMPLANT
YANKAUER SUCT BULB TIP NO VENT (SUCTIONS) ×3 IMPLANT

## 2017-09-08 NOTE — Anesthesia Preprocedure Evaluation (Addendum)
Anesthesia Evaluation  Patient identified by MRN, date of birth, ID band  Reviewed: Allergy & Precautions, NPO status , Patient's Chart, lab work & pertinent test results, reviewed documented beta blocker date and time   History of Anesthesia Complications Negative for: history of anesthetic complications  Airway Mallampati: I  TM Distance: >3 FB Neck ROM: Full    Dental  (+) Edentulous Upper, Edentulous Lower, Dental Advisory Given   Pulmonary sleep apnea and Continuous Positive Airway Pressure Ventilation , former smoker,    breath sounds clear to auscultation       Cardiovascular hypertension, Pt. on medications and Pt. on home beta blockers +CHF   Rhythm:Regular Rate:Normal  TTE 03/2017 with EF 60-65%, LA severely dilated, no valvular abnormalities  EKG 03/2017 sinus brady with 1st degree AV block   Neuro/Psych Recently taking 10mg  prednisone QD for thoracic cord compression, finished taper 09/06/17    GI/Hepatic negative GI ROS, Neg liver ROS,   Endo/Other  Obesity, gout  Renal/GU Renal diseaseCr 1.8     Musculoskeletal  (+) Arthritis , Osteoarthritis,    Abdominal   Peds  Hematology negative hematology ROS (+)   Anesthesia Other Findings Day of surgery medications reviewed with the patient.  Reproductive/Obstetrics                            Anesthesia Physical Anesthesia Plan  ASA: III  Anesthesia Plan: General   Post-op Pain Management:    Induction: Intravenous  PONV Risk Score and Plan: 1 and Ondansetron, Dexamethasone, Treatment may vary due to age or medical condition and Midazolam  Airway Management Planned: Oral ETT  Additional Equipment:   Intra-op Plan:   Post-operative Plan: Extubation in OR  Informed Consent: I have reviewed the patients History and Physical, chart, labs and discussed the procedure including the risks, benefits and alternatives for the proposed  anesthesia with the patient or authorized representative who has indicated his/her understanding and acceptance.   Dental advisory given  Plan Discussed with: CRNA  Anesthesia Plan Comments:        Anesthesia Quick Evaluation

## 2017-09-08 NOTE — Anesthesia Procedure Notes (Addendum)
Procedure Name: Intubation Date/Time: 09/08/2017 12:22 PM Performed by: Shirlyn Goltz, CRNA Pre-anesthesia Checklist: Patient identified, Emergency Drugs available, Suction available and Patient being monitored Patient Re-evaluated:Patient Re-evaluated prior to induction Oxygen Delivery Method: Circle system utilized Preoxygenation: Pre-oxygenation with 100% oxygen Induction Type: IV induction Ventilation: Mask ventilation without difficulty and Oral airway inserted - appropriate to patient size Laryngoscope Size: Mac and 4 Grade View: Grade I Tube type: Oral Tube size: 8.0 mm Number of attempts: 1 Airway Equipment and Method: Stylet Placement Confirmation: ETT inserted through vocal cords under direct vision,  positive ETCO2 and breath sounds checked- equal and bilateral Secured at: 23 cm Tube secured with: Tape Dental Injury: Teeth and Oropharynx as per pre-operative assessment

## 2017-09-08 NOTE — H&P (Signed)
PREOPERATIVE H&P  Chief Complaint: Bilateral lower extremity weakness  HPI: Steven Patton is a 82 y.o. male who presents with ongoing weakness in the bilateral legs  MRI reveals spinal cord compression at T11/12  Patient has failed multiple forms of conservative care and continues to have pain (see office notes for additional details regarding the patient's full course of treatment)  Past Medical History:  Diagnosis Date  . Arthritis   . Cataract   . Hypertension    takes Amlodipine,Lisinopril,and Metoprolol daily  . Lower extremity weakness    Bilateral  . Macular degeneration, left eye   . Sleep apnea    2008.Marland KitchenMarland KitchenWEARS CPAP   Past Surgical History:  Procedure Laterality Date  . BACK SURGERY    . CARDIAC CATHETERIZATION  06-10-12  . COLONOSCOPY    . EYE SURGERY     CATARACT RIGHT EYE  . KNEE SURGERY    . LUMBAR LAMINECTOMY/DECOMPRESSION MICRODISCECTOMY N/A 11/02/2012   Procedure: LUMBAR LAMINECTOMY/DECOMPRESSION MICRODISCECTOMY 2 LEVEL;  Surgeon: Emilee Hero, MD;  Location: [facility] OR;  Service: Orthopedics;  Laterality: N/A;  Lumbar 4-5,lumbar 5-sacrum 1 decompression  . TONSILLECTOMY    . TOTAL HIP ARTHROPLASTY Left 01/06/2013   Procedure: LEFT TOTAL HIP ARTHROPLASTY-Posterior approach;  Surgeon: Harvie Junior, MD;  Location: [facility] OR;  Service: Orthopedics;  Laterality: Left;  . TOTAL HIP ARTHROPLASTY Right 04/21/2013   Procedure: TOTAL HIP ARTHROPLASTY ANTERIOR APPROACH;  Surgeon: Harvie Junior, MD;  [facility];  Service: Orthopedics;  Laterality: Right;  . TOTAL KNEE ARTHROPLASTY Left 09/16/2012   Procedure: TOTAL KNEE ARTHROPLASTY;  Surgeon: Harvie Junior, MD;  Location[facility] OR;  Service: Orthopedics;  Laterality: Left;   Social History   Socioeconomic History  . Marital status: Married    Spouse name: Not on file  . Number of children: 5  . Years of education: Not on file  . Highest education level: Not on file  Occupational History  . Not on file    Social Needs  . Financial resource strain: Not on file  . Food insecurity:    Worry: Not on file    Inability: Not on file  . Transportation needs:    Medical: Not on file    Non-medical: Not on file  Tobacco Use  . Smoking status: Former Smoker    Packs/day: 1.00    Years: 20.00    Pack years: 20.00    Types: Cigarettes    Last attempt to quit: 01/04/1993    Years since quitting: 24.6  . Smokeless tobacco: Former Neurosurgeon    Types: Chew    Quit date: 01/04/1993  . Tobacco comment: quit 71yrs ago  Substance and Sexual Activity  . Alcohol use: No  . Drug use: No  . Sexual activity: Not Currently    Birth control/protection: None  Lifestyle  . Physical activity:    Days per week: Not on file    Minutes per session: Not on file  . Stress: Not on file  Relationships  . Social connections:    Talks on phone: Not on file    Gets together: Not on file    Attends religious service: Not on file    Active member of club or organization: Not on file    Attends meetings of clubs or organizations: Not on file    Relationship status: Not on file  Other Topics Concern  . Not on file  Social History Narrative  . Not on file  Family History  Problem Relation Age of Onset  . Cancer Daughter   . Alzheimer's disease Mother   . Lung cancer Father    No Known Allergies Prior to Admission medications   Medication Sig Start Date End Date Taking? Authorizing Provider  allopurinol (ZYLOPRIM) 300 MG tablet TAKE 1 TABLET (300 MG TOTAL) BY MOUTH 2 (TWO) TIMES DAILY. 04/11/17  Yes Monica Bectonhekkekandam, Thomas J, MD  amLODipine (NORVASC) 10 MG tablet Take 1 tablet (10 mg total) by mouth daily. 11/06/16  Yes Monica Bectonhekkekandam, Thomas J, MD  aspirin 81 MG tablet Take 1 tablet (81 mg total) by mouth daily. 04/08/15  Yes Monica Bectonhekkekandam, Thomas J, MD  atorvastatin (LIPITOR) 40 MG tablet TAKE 1 TABLET EVERY DAY  AT  6PM 06/07/17  Yes Monica Bectonhekkekandam, Thomas J, MD  carvedilol (COREG) 25 MG tablet TAKE 1 TABLET TWICE DAILY  WITH A MEAL 04/11/17  Yes Monica Bectonhekkekandam, Thomas J, MD  gabapentin (NEURONTIN) 600 MG tablet TAKE 1 TABLET THREE TIMES DAILY 12/12/16  Yes Monica Bectonhekkekandam, Thomas J, MD  hydrALAZINE (APRESOLINE) 25 MG tablet TAKE 1 TABLET TWICE DAILY 07/20/17  Yes Jodelle GrossLawrence, Kathryn M, NP  meloxicam (MOBIC) 15 MG tablet TAKE 1 TABLET EVERY DAY 12/12/16  Yes Monica Bectonhekkekandam, Thomas J, MD  metolazone (ZAROXOLYN) 5 MG tablet Take 1 tablet (5 mg total) by mouth daily. 06/08/17  Yes Monica Bectonhekkekandam, Thomas J, MD  nitroGLYCERIN (NITROSTAT) 0.4 MG SL tablet Place 1 tablet (0.4 mg total) under the tongue every 5 (five) minutes as needed for chest pain (or tightness). 05/24/17  Yes Monica Bectonhekkekandam, Thomas J, MD  Potassium Chloride ER 20 MEQ TBCR Take 20 mEq by mouth daily. 08/05/17  Yes [provider]  spironolactone (ALDACTONE) 25 MG tablet Take 1 tablet (25 mg total) by mouth daily. 06/08/17  Yes Monica Bectonhekkekandam, Thomas J, MD  torsemide (DEMADEX) 20 MG tablet Take 2 tablets (40 mg total) by mouth 2 (two) times daily. 03/29/17  Yes Jodelle GrossLawrence, Kathryn M, NP  traZODone (DESYREL) 50 MG tablet Take 1 tablet (50 mg total) by mouth at bedtime. 05/24/17  Yes Monica Bectonhekkekandam, Thomas J, MD  AMBULATORY NON FORMULARY MEDICATION Knee-high, firm compression, custom made, graduated compression stockings. Apply to lower extremities. 10/26/16   Monica Bectonhekkekandam, Thomas J, MD  predniSONE (STERAPRED UNI-PAK 48 TAB) 10 MG (48) TBPK tablet Take by mouth daily. Use as directed for taper Patient not taking: Reported on 08/30/2017 08/23/17   Monica Bectonhekkekandam, Thomas J, MD     All other systems have been reviewed and were otherwise negative with the exception of those mentioned in the HPI and as above.  Physical Exam: There were no vitals filed for this visit.  There is no height or weight on file to calculate BMI.  General: Alert, no acute distress Cardiovascular: No pedal edema Respiratory: No cyanosis, no use of accessory musculature Skin: No lesions in the area of  chief complaint Neurologic: Sensation intact distally Psychiatric: Patient is competent for consent with normal mood and affect Lymphatic: No axillary or cervical lymphadenopathy  MUSCULOSKELETAL: + bilateral lower extremity weakness diffusely  Assessment/Plan: THORACIC MYEOLOPATHY Plan for Procedure(s): THORACIC 11-12 DECOMPRESSION   Emilee HeroUMONSKI,Deantae Shackleton LEONARD, MD 09/08/2017 7:38 AM

## 2017-09-08 NOTE — Transfer of Care (Signed)
Immediate Anesthesia Transfer of Care Note  Patient: Steven Patton  Procedure(s) Performed: THORACIC 11-12 DECOMPRESSION (N/A Back)  Patient Location: PACU  Anesthesia Type:General  Level of Consciousness: awake, alert , oriented and patient cooperative  Airway & Oxygen Therapy: Patient Spontanous Breathing  Post-op Assessment: Report given to RN and Post -op Vital signs reviewed and stable  Post vital signs: Reviewed and stable  Last Vitals:  Vitals Value Taken Time  BP 130/57 09/08/2017  4:51 PM  Temp    Pulse 65 09/08/2017  4:56 PM  Resp 14 09/08/2017  4:56 PM  SpO2 95 % 09/08/2017  4:56 PM  Vitals shown include unvalidated device data.  Last Pain:  Vitals:   09/08/17 0955  TempSrc:   PainSc: 0-No pain      Patients Stated Pain Goal: 3 (09/08/17 0955)  Complications: No apparent anesthesia complications

## 2017-09-08 NOTE — Op Note (Signed)
NAME:  Steven Patton             MEDICAL RECORD NO.:  [id number]  PHYSICIAN:  Estill Bamberg, MD      DATE OF BIRTH:  04-21-1935  DATE OF PROCEDURE:  09/08/2017                               OPERATIVE REPORT   PREOPERATIVE DIAGNOSES: 1. Bilateral lower extremity weakness 2. 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 Bilateral lower extremity weakness 2. 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  ASSISTANTJason Coop, PA-C.  ANESTHESIA:  General endotracheal anesthesia.  COMPLICATIONS:  None.  DISPOSITION:  Stable.  ESTIMATED BLOOD LOSS:  Minimal.  INDICATIONS FOR SURGERY:  Briefly, Steven Patton is a very pleasant 82- year-old patient, who did present to me with progressive weakness in the bilateral legs. The patient's MRI did reveal the findings noted above. Given the patient's ongoing and progressive dysfunction, we did discuss proceeding with the procedure reflected above.  The patient was fully aware of the risks and limitations of surgery and did wish to proceed.  Of note, preoperatively, I did identify the T11-12 level as being the most severe level.  Immediately prior to the patient's surgery, I did again review the patient's MRI, and I did feel that the T10-11 level also had significant stenosis, and I did also feel that the stenosis was contributing to the patient's myelopathic symptoms, and I did elect to proceed with a decompression at both the T10-11, and T11-12 levels, in order to give the patient the highest chance for recovery of his neurologic function.  OPERATIVE DETAILS:  On 09/08/2017, the patient was brought to surgery and general endotracheal anesthesia was administered.  The patient was placed prone on a well-padded flat Jackson bed with a spinal  frame. Antibiotics were given and the back was prepped and draped in the usual sterile fashion.  A time-out procedure was performed.  I then made a midline incision overlying the T10-T12 levels.  The fascia was incised at the midline.  The paraspinal musculature was bluntly retracted laterally and held retracted with a self-retaining retractor. After confirming the appropriate operative level, I did remove the spinous processes of T10 and T11.  At this point, I proceeded with a partial facetectomy on the right and left sides at the T11/12 level.  Of note, there was very substantial and very significant overgrowth of the facet joint bilaterally, and there was also very substantial hypertrophy of the ligamentum flavum.  This was causing very severe spinal stenosis and very significant compression of the thoracic spinal cord.  Given the very significant stenosis and spinal cord compression, I was extremely meticulous and removing the facet overgrowth, and did ensure minimal compression of the dura and spinal cord while removing the facet overgrowth. The compression was addressed by using Kerrison punches to thoroughly and decompress the right and left lateral recess and central spinal canal.  I then continued the decompression up to the T10/11 level.  Once again, I was very meticulous, and did ensure no undue pressure on the spinal cord itself.  At this level, there was also noted to be significant compression of the dura and underlying spinal cord.  A bilateral partial facetectomy was performed, decompressing the spinal canal and  spinal cord.  On the patient's right side, there was a very small bony fragment, entirely adherent to the dura on the right side.  This was not causing compression, and I did feel that removing this bony fragment would bring with it substantial risk of either spinal cord injury or durotomy. At the termination of the partial facetectomies and ligamentum flavum removal at each  level, I was very pleased with the decompression.  The spinal canal was entirely decompressed.  All bleeding was then adequately controlled. The wound was copiously irrigated with a total of approximately 2 L of normal saline. Gelfoam was placed over the laminectomy site. There was no extravasation of cerebrospinal fluid noted throughout the entire surgery.  At this point, the wound was closed in layers using #1 Vicryl, followed by 2-0 Vicryl, followed by 4- 0 Monocryl.  Benzoin and Steri-Strips were applied, followed by a sterile dressing.  All instrument counts were correct at the termination of the procedure.  The patient was then awakened from general endotracheal anesthesia and transferred to the recovery room in stable condition.  Of note, Jason CoopKayla McKenzie, PA-C, was my assistant throughout surgery, and did aid in retraction, suctioning, and closure from start to finish.     Estill BambergMark Rodriquez Thorner, MD

## 2017-09-09 ENCOUNTER — Encounter (HOSPITAL_COMMUNITY): Payer: Self-pay | Admitting: Orthopedic Surgery

## 2017-09-09 DIAGNOSIS — G9589 Other specified diseases of spinal cord: Secondary | ICD-10-CM | POA: Diagnosis not present

## 2017-09-09 MED FILL — Thrombin (Recombinant) For Soln 20000 Unit: CUTANEOUS | Qty: 1 | Status: AC

## 2017-09-09 NOTE — Progress Notes (Signed)
Patient alert and oriented, mae's well, voiding adequate amount of urine, swallowing without difficulty, no c/o pain at time of discharge. Patient discharged home with family. Script and discharged instructions given to patient. Patient and family stated understanding of instructions given. Patient has an appointment with Dr. Dumonski 

## 2017-09-09 NOTE — Progress Notes (Signed)
Physical Therapy Evaluation Patient Details Name: Steven Patton MRN: [id number] DOB: [birth year] Today's Date: 09/09/2017   History of Present Illness  82 y.o. male who presents with ongoing weakness in the bilateral legs. MRI reveals spinal cord compression at T11/12. s/p T10/11, T11/12 laminectomy with bilateral partial facetectomy with decompression.    Clinical Impression  Patient is s/p above surgery resulting in functional limitations due to the deficits listed below (see PT Problem List). At the time of Eval pt was in bed with family member present reporting no pain. Seen with OT to maximize safety during attempts of functional mobility. Reviewed precaution handout with pt and daughter prior to mobility. Pt performed bed mobility with mod A +2 for safety and hand placement requiring assistance for LE and trunk elevation and min A for scooting EOB. Educated daughter and pt on proper use of TLSO brace, with daughter assisting pt with donning. Pt ambulated short distance with RW which is normal at baseline,  with min guard for safety and VC for proper ambulation in RW in order to improve posture. Pt required ride back to room in W/C secondary to fatigue. Pt left in chair with call bell and all needs met. Pt will have 24/7 care upon d/c and reported he has all necessary equipment at home. Patient will benefit from skilled PT to increase their independence and safety with mobility to allow discharge to the venue listed below.      Follow Up Recommendations Home health PT    Equipment Recommendations  None recommended by PT    Recommendations for Other Services       Precautions / Restrictions Precautions Precautions: Back;Fall Precaution Booklet Issued: Yes (comment) Precaution Comments: Reviewed precautions with pt and daughter Required Braces or Orthoses: Spinal Brace Spinal Brace: Thoracolumbosacral orthotic;Applied in sitting position Restrictions Weight Bearing Restrictions: No       Mobility  Bed Mobility Overal bed mobility: Needs Assistance Bed Mobility: Rolling;Sidelying to Sit Rolling: +2 for safety/equipment;Min assist Sidelying to sit: Mod assist;+2 for safety/equipment       General bed mobility comments: Pt initiated push off with R elbow but required mod A to completely elevate trunk into sitting. Min A and VC to scoot EOB to get feet on floor.   Transfers Overall transfer level: Needs assistance Equipment used: Rolling walker (2 wheeled) Transfers: Sit to/from Stand Sit to Stand: Min assist;+2 safety/equipment         General transfer comment: Min cues for hand placement and safety with RW as pt is used to pulling to stand with grab bar at bed side before using RW   Ambulation/Gait Ambulation/Gait assistance: Min guard;+2 safety/equipment Gait Distance (Feet): 20 Feet Assistive device: Rolling walker (2 wheeled) Gait Pattern/deviations: Step-to pattern;Step-through pattern;Decreased step length - right;Antalgic;Wide base of support Gait velocity: decreased Gait velocity interpretation: <1.31 ft/sec, indicative of household ambulator General Gait Details: Pt required min guard for safety, appropriate use of RW, and to improve posture. Adjusted RW to pt preference which increased gait speed. Pt ambulates short distances at baseline limited by fatigue and generalized weakness. W/C utilized at end of gait.   Stairs            Wheelchair Mobility    Modified Rankin (Stroke Patients Only)       Balance Overall balance assessment: Needs assistance Sitting-balance support: No upper extremity supported;Feet supported Sitting balance-Leahy Scale: Fair Sitting balance - Comments: Pt donned clothes with no LOB    Standing balance support: Bilateral upper extremity  supported;During functional activity Standing balance-Leahy Scale: Poor Standing balance comment: Reliant on UE support                             Pertinent  Vitals/Pain Pain Assessment: 0-10 Pain Score: 0-No pain Pain Intervention(s): Monitored during session;Repositioned    Home Living Family/patient expects to be discharged to:: Private residence Living Arrangements: Spouse/significant other;Children Available Help at Discharge: Family;Available 24 hours/day Type of Home: House Home Access: Level entry     Home Layout: One level Home Equipment: Walker - 2 wheels;Shower seat;Shower seat - built in;Grab bars - toilet;Grab bars - tub/shower;Hand held shower head;Adaptive equipment;Wheelchair - manual Additional Comments: Pt has grab bar beside bed which he utilizes getting in and out of bed     Prior Function Level of Independence: Independent with assistive device(s)         Comments: Uses RW for short distances (bed<>baath) and W/C for long distance mobility       Hand Dominance   Dominant Hand: Right    Extremity/Trunk Assessment   Upper Extremity Assessment Upper Extremity Assessment: Defer to OT evaluation    Lower Extremity Assessment Lower Extremity Assessment: Generalized weakness    Cervical / Trunk Assessment Cervical / Trunk Assessment: Kyphotic;Other exceptions Cervical / Trunk Exceptions: s/p decompression sx  Communication   Communication: No difficulties  Cognition Arousal/Alertness: Awake/alert Behavior During Therapy: WFL for tasks assessed/performed Overall Cognitive Status: Within Functional Limits for tasks assessed                                        General Comments General comments (skin integrity, edema, etc.): Daughter present through out session; pt required assist from daughter to don brace seated EOB    Exercises     Assessment/Plan    PT Assessment Patient needs continued PT services  PT Problem List Decreased strength;Decreased range of motion;Decreased activity tolerance;Decreased balance;Decreased mobility;Decreased coordination;Decreased knowledge of use of  DME;Decreased knowledge of precautions       PT Treatment Interventions DME instruction;Gait training;Functional mobility training;Therapeutic activities;Therapeutic exercise;Balance training;Patient/family education    PT Goals (Current goals can be found in the Care Plan section)  Acute Rehab PT Goals Patient Stated Goal: "go home today" PT Goal Formulation: With patient Time For Goal Achievement: 09/16/17 Potential to Achieve Goals: Good    Frequency Min 5X/week   Barriers to discharge        Co-evaluation PT/OT/SLP Co-Evaluation/Treatment: Yes Reason for Co-Treatment: For patient/therapist safety PT goals addressed during session: Mobility/safety with mobility;Proper use of DME         AM-PAC PT "6 Clicks" Daily Activity  Outcome Measure Difficulty turning over in bed (including adjusting bedclothes, sheets and blankets)?: Unable Difficulty moving from lying on back to sitting on the side of the bed? : Unable Difficulty sitting down on and standing up from a chair with arms (e.g., wheelchair, bedside commode, etc,.)?: Unable Help needed moving to and from a bed to chair (including a wheelchair)?: A Lot Help needed walking in hospital room?: A Little Help needed climbing 3-5 steps with a railing? : Total 6 Click Score: 9    End of Session Equipment Utilized During Treatment: Gait belt;Back brace Activity Tolerance: Patient limited by fatigue Patient left: in chair;with call bell/phone within reach;with family/visitor present Nurse Communication: Mobility status PT Visit Diagnosis: Other abnormalities of  gait and mobility (R26.89);Muscle weakness (generalized) (M62.81)    Time: 9432-7614 PT Time Calculation (min) (ACUTE ONLY): 41 min   Charges:              Einar Crow, SPT  Student Physical Therapist Acute Rehab [phone]   Einar Crow 09/09/2017, 8:57 AM

## 2017-09-09 NOTE — Evaluation (Signed)
Occupational Therapy Evaluation Patient Details Name: Steven Patton MRN: [id number] DOB: November 09, 1935 Today's Date: 09/09/2017    History of Present Illness 82 y.o. male who presents with ongoing weakness in the bilateral legs. MRI reveals spinal cord compression at T11/12. s/p T10/11, T11/12 laminectomy with bilateral partial facetectomy with decompression.   Clinical Impression   PTA, pt was living with his wife and daughter and was performing BADLs and using RW and w/c for functional mobility. Currently, pt requires Mod A for brace management, Min A for LB ADLs with AE, and Min A +2 for functional mobility using RW. Provided education and handout on bed mobility, brace management, UB ADLs, LB ADLs with AE, and toileting; pt demonstrated understanding. Answering all pt and family questions in preparation for dc later today. Recommend dc home once medically stable per physician. All acute OT needs met and will sign off. Thank you.     Follow Up Recommendations  Home health OT;Supervision/Assistance - 24 hour    Equipment Recommendations  None recommended by OT    Recommendations for Other Services       Precautions / Restrictions        Mobility Bed Mobility Overal bed mobility: Needs Assistance Bed Mobility: Rolling;Sidelying to Sit Rolling: +2 for safety/equipment;Min assist Sidelying to sit: Mod assist;+2 for safety/equipment       General bed mobility comments: Mod A to elevate trunk into sitting from sidelying. Tactile cues for rolling and bringing BLEs over EOB  Transfers Overall transfer level: Needs assistance Equipment used: Rolling walker (2 wheeled) Transfers: Sit to/from Stand Sit to Stand: Min assist;+2 safety/equipment         General transfer comment: Min A for safety and stability. +2 for safety. Cues for hand placement    Balance Overall balance assessment: Needs assistance Sitting-balance support: No upper extremity supported;Feet supported Sitting  balance-Leahy Scale: Fair     Standing balance support: Bilateral upper extremity supported;During functional activity Standing balance-Leahy Scale: Poor Standing balance comment: Reliant on UE support                           ADL either performed or assessed with clinical judgement   ADL Overall ADL's : Needs assistance/impaired Eating/Feeding: Set up;Sitting   Grooming: Set up;Sitting   Upper Body Bathing: Minimal assistance;Sitting   Lower Body Bathing: Minimal assistance;Sit to/from stand;With adaptive equipment   Upper Body Dressing : Sitting;With caregiver independent assisting;Set up;Moderate assistance Upper Body Dressing Details (indicate cue type and reason): Pt donning shirt with set up at EOB. Mod A to don brace with family assistance. Lower Body Dressing: Minimal assistance;Sit to/from stand;+2 for safety/equipment;Cueing for back precautions;Adhering to back precautions;With adaptive equipment Lower Body Dressing Details (indicate cue type and reason): Pt donning pants and socks with AE and MIn A for management of AE. Toilet Transfer: Minimal assistance;+2 for safety/equipment;RW;Ambulation           Functional mobility during ADLs: Minimal assistance;+2 for safety/equipment;Rolling walker General ADL Comments: Pt presenting near baseline function. Requiring Min A -Mod A for UB ADLs, MIn A for LB ADLs with AE, and Min A for funcitonal mobility. Providing education and handout on back precautions and compensatory techniques for adherance.      Vision Baseline Vision/History: Wears glasses Wears Glasses: At all times Patient Visual Report: No change from baseline       Perception     Praxis      Pertinent Vitals/Pain Pain Assessment: 0-10  Pain Score: 0-No pain Pain Intervention(s): Monitored during session     Hand Dominance Right   Extremity/Trunk Assessment Upper Extremity Assessment Upper Extremity Assessment: Generalized weakness    Lower Extremity Assessment Lower Extremity Assessment: Defer to PT evaluation   Cervical / Trunk Assessment Cervical / Trunk Assessment: Kyphotic;Other exceptions Cervical / Trunk Exceptions: s/p decompression sx   Communication Communication Communication: No difficulties   Cognition Arousal/Alertness: Awake/alert Behavior During Therapy: WFL for tasks assessed/performed Overall Cognitive Status: Within Functional Limits for tasks assessed                                     General Comments  Daughter present throughout session    Exercises     Shoulder Instructions      Home Living Family/patient expects to be discharged to:: Private residence Living Arrangements: Spouse/significant other;Children(Daughter) Available Help at Discharge: Family;Available 24 hours/day Type of Home: House Home Access: Level entry     Home Layout: One level     Bathroom Shower/Tub: Occupational psychologist: Handicapped height     Home Equipment: Environmental consultant - 2 wheels;Shower seat;Shower seat - built in;Grab bars - toilet;Grab bars - tub/shower;Hand held shower head;Adaptive equipment;Wheelchair - Higher education careers adviser: [employer]acher        Prior Functioning/Environment Level of Independence: Independent with assistive device(s)        Comments: Pt able to perform BADLs. Uses RW for short distances. w/c for long distance mobility. Family performing IADLs.         OT Problem List: Decreased strength;Decreased range of motion;Decreased activity tolerance;Impaired balance (sitting and/or standing);Decreased knowledge of use of DME or AE;Decreased knowledge of precautions;Obesity      OT Treatment/Interventions:      OT Goals(Current goals can be found in the care plan section) Acute Rehab OT Goals Patient Stated Goal: "go home today" OT Goal Formulation: All assessment and education complete, DC therapy  OT Frequency:     Barriers to D/C:             Co-evaluation              AM-PAC PT "6 Clicks" Daily Activity     Outcome Measure Help from another person eating meals?: None Help from another person taking care of personal grooming?: None Help from another person toileting, which includes using toliet, bedpan, or urinal?: A Little Help from another person bathing (including washing, rinsing, drying)?: A Little Help from another person to put on and taking off regular upper body clothing?: A Lot Help from another person to put on and taking off regular lower body clothing?: A Little 6 Click Score: 19   End of Session Equipment Utilized During Treatment: Gait belt;Rolling walker;Back brace Nurse Communication: Mobility status;Precautions  Activity Tolerance: Patient tolerated treatment well Patient left: in chair;with call bell/phone within reach;with family/visitor present;with nursing/sitter in room(with PT)  OT Visit Diagnosis: Unsteadiness on feet (R26.81);Other abnormalities of gait and mobility (R26.89);Muscle weakness (generalized) (M62.81)                Time: 8527-7824 OT Time Calculation (min): 24 min Charges:  OT General Charges $OT Visit: 1 Visit OT Evaluation $OT Eval Moderate Complexity: [place], OTR/L Acute Rehab Pager: [phone] Office: [place] 09/09/2017, 8:31 AM

## 2017-09-09 NOTE — Progress Notes (Signed)
    Patient doing well  Has been ambulating with walker   Physical Exam: Vitals:   09/08/17 2306 09/09/17 0419  BP: 139/70 (!) 143/66  Pulse: (!) 58 70  Resp: 18 20  Temp: 98 F (36.7 C) 98.4 F (36.9 C)  SpO2: 94% 97%    Dressing in place NVI  POD #1 s/p T10-T12 decompression for severe spinal cord compression  - encourage ambulation - Percocet for pain, Valium for muscle spasms - d/c home today with HHPT with f/u in 2 weeks after AM PT/OT

## 2017-09-09 NOTE — Care Management Note (Signed)
Case Management Note  Patient Details  Name: Steven Patton MRN: [id number] Date of Birth: 08/08/1935  Subjective/Objective:  82 yr old gentleman s/p T10/11, T11/12 laminectomy with bilateral partial facetectomy with decompression                  Action/Plan: Case manager spoke with patient's daughter concerning discharge plan. The patient has declined Home Health services, plans to follow up with Dr.Dumonski and if should need therapy it will be determined then. He will have family support at discharge.   *    Expected Discharge Date:  09/09/17               Expected Discharge Plan:  Home/Self Care  In-House Referral:  NA  Discharge planning Services  CM Consult  Post Acute Care Choice:  NA Choice offered to:  Adult Children  DME Arranged:  N/A DME Agency:  NA  HH Arranged:  Patient Refused HH HH Agency:  NA  Status of Service:  Completed, signed off  If discussed at Long Length of Stay Meetings, dates discussed:    Additional Comments:  Durenda GuthrieBrady, Nick Stults Naomi, RN 09/09/2017, 10:33 AM

## 2017-09-10 NOTE — Anesthesia Postprocedure Evaluation (Signed)
Anesthesia Post Note  Patient: Audree CamelHoyle Mcelveen  Procedure(s) Performed: THORACIC 11-12 DECOMPRESSION (N/A Back)     Patient location during evaluation: PACU Anesthesia Type: General Level of consciousness: awake and alert Pain management: pain level controlled Vital Signs Assessment: post-procedure vital signs reviewed and stable Respiratory status: spontaneous breathing, nonlabored ventilation, respiratory function stable and patient connected to nasal cannula oxygen Cardiovascular status: blood pressure returned to baseline and stable Postop Assessment: no apparent nausea or vomiting Anesthetic complications: no    Last Vitals:  Vitals:   09/09/17 0419 09/09/17 0727  BP: (!) 143/66 134/66  Pulse: 70 77  Resp: 20 20  Temp: 36.9 C 36.8 C  SpO2: 97% 94%    Last Pain:  Vitals:   09/09/17 0727  TempSrc: Oral  PainSc:                  Jaydyn Menon

## 2017-09-13 ENCOUNTER — Ambulatory Visit: Payer: Medicare FFS | Admitting: Sports Medicine

## 2017-09-16 ENCOUNTER — Telehealth: Payer: Self-pay | Admitting: Sports Medicine

## 2017-09-16 NOTE — Telephone Encounter (Signed)
Order needs to come from the person who made the bed necessary, have him request from the surgeon.

## 2017-09-16 NOTE — Telephone Encounter (Signed)
Pts daughter- Victorino DikeJennifer called and needs [facility]. He just had surgery and they need this bed as soon as possible

## 2017-09-22 ENCOUNTER — Ambulatory Visit: Payer: Medicare FFS

## 2017-09-22 ENCOUNTER — Encounter: Payer: Medicare FFS | Admitting: Neurology

## 2017-09-30 ENCOUNTER — Ambulatory Visit: Payer: Medicare FFS | Admitting: Sports Medicine

## 2017-09-30 ENCOUNTER — Telehealth: Payer: Self-pay | Admitting: Sports Medicine

## 2017-09-30 NOTE — Telephone Encounter (Signed)
Pt's daughter called to state today Pt was trying to urinate and having difficulty. When he was able to urinate, there was some "red tissue like substance" in his urine. Questions if he should be seen today? He does have a hosp f/u tomorrow from his back surgery (he is 3 weeks post op). Or should he go to the lab and provide a sample?  Denies any dysuria, flank pain, fever, etc. Only states "it was difficult to begin urinating."   Routing

## 2017-09-30 NOTE — Telephone Encounter (Signed)
Per discussion I will see him tomorrow, as long as he does not develop severe symptoms, he should avoid urinating before his visit so we can take a urine sample.

## 2017-09-30 NOTE — Telephone Encounter (Signed)
Daughter and Pt advised.

## 2017-10-01 ENCOUNTER — Encounter: Payer: Self-pay | Admitting: Sports Medicine

## 2017-10-01 ENCOUNTER — Ambulatory Visit (INDEPENDENT_AMBULATORY_CARE_PROVIDER_SITE_OTHER): Payer: Medicare FFS | Admitting: Sports Medicine

## 2017-10-01 VITALS — BP 114/67 | HR 66 | Temp 98.0°F

## 2017-10-01 DIAGNOSIS — F5101 Primary insomnia: Secondary | ICD-10-CM

## 2017-10-01 DIAGNOSIS — G9520 Unspecified cord compression: Secondary | ICD-10-CM | POA: Diagnosis not present

## 2017-10-01 DIAGNOSIS — Z23 Encounter for immunization: Secondary | ICD-10-CM

## 2017-10-01 DIAGNOSIS — R319 Hematuria, unspecified: Secondary | ICD-10-CM

## 2017-10-01 MED ORDER — PREDNISONE 10 MG (48) PO TBPK: ORAL_TABLET | Freq: Every day | ORAL | 0 refills | Status: DC

## 2017-10-01 MED ORDER — TRAZODONE HCL 100 MG PO TABS: 100.0000 mg | ORAL_TABLET | Freq: Every day | ORAL | 3 refills | Status: DC

## 2017-10-01 NOTE — Assessment & Plan Note (Signed)
Status post multilevel thoracic decompression. He had a 5-day prednisone course that provided fantastic relief of his leg weakness, when the prednisone ended some of the weakness return. I am going to prolong this course with a 12-day taper. Follow-up in 2 weeks.

## 2017-10-01 NOTE — Assessment & Plan Note (Addendum)
He did have an episode of hematuria with mucus, likely from urethral trauma secondary to Foley catheter. Fever to 102 Fahrenheit yesterday, feels good today, no urinary symptoms. We are going to get a urinalysis and urine culture, obtained here and sent to the lab. If spikes another fever we will add antibiotics.  Pansensitive E. coli, best sensitivity is with levofloxacin, 7 days sent in.

## 2017-10-01 NOTE — Assessment & Plan Note (Signed)
Increasing trazodone to 100 mg nightly.

## 2017-10-01 NOTE — Progress Notes (Addendum)
Subjective:    CC: Follow-up  HPI: Steven Patton is approximately 3 weeks post multilevel thoracic spine decompression for spinal cord compression, overall he is doing well, some days the strength in his legs is better, other days it is just as weak as before surgery, he has had some urinary changes, passing occasional clots of blood.  He had a fever to 102 Fahrenheit yesterday but since then he has been asymptomatic.  Jason CoopKayla McKenzie, PA-C did prescribe burst of prednisone which seemed to resolve his leg weakness very quickly.  Insomnia: Persistent on 50 of trazodone, agreeable to go up on dose.  I reviewed the past medical history, family history, social history, surgical history, and allergies today and no changes were needed.  Please see the problem list section below in epic for further details.  Past Medical History: Past Medical History:  Diagnosis Date  . Arthritis   . Cataract   . Hypertension    takes Amlodipine,Lisinopril,and Metoprolol daily  . Lower extremity weakness    Bilateral  . Macular degeneration, left eye   . Sleep apnea    2008.Marland Kitchen.Marland Kitchen.WEARS CPAP   Past Surgical History: Past Surgical History:  Procedure Laterality Date  . BACK SURGERY    . CARDIAC CATHETERIZATION  06-10-12  . COLONOSCOPY    . EYE SURGERY     CATARACT RIGHT EYE  . KNEE SURGERY    . LUMBAR LAMINECTOMY/DECOMPRESSION MICRODISCECTOMY N/A 11/02/2012   Procedure: LUMBAR LAMINECTOMY/DECOMPRESSION MICRODISCECTOMY 2 LEVEL;  Surgeon: Emilee HeroMark Leonard Dumonski, MD;  [facility];  Service: Orthopedics;  Laterality: N/A;  Lumbar 4-5,lumbar 5-sacrum 1 decompression  . LUMBAR LAMINECTOMY/DECOMPRESSION MICRODISCECTOMY N/A 09/08/2017   Procedure: THORACIC 11-12 DECOMPRESSION;  Surgeon: Estill Bambergumonski, Mark, MD;  Location: [facility];  Service: Orthopedics;  Laterality: N/A;  . TONSILLECTOMY    . TOTAL HIP ARTHROPLASTY Left 01/06/2013   Procedure: LEFT TOTAL HIP ARTHROPLASTY-Posterior approach;  Surgeon: Harvie JuniorJohn L Graves, MD;  [facility];  Service: Orthopedics;  Laterality: Left;  . TOTAL HIP ARTHROPLASTY Right 04/21/2013   Procedure: TOTAL HIP ARTHROPLASTY ANTERIOR APPROACH;  Surgeon: Harvie JuniorJohn L Graves, MD;  [facility]: [facility];  Service: Orthopedics;  Laterality: Right;  . TOTAL KNEE ARTHROPLASTY Left 09/16/2012   Procedure: TOTAL KNEE ARTHROPLASTY;  Surgeon: Harvie JuniorJohn L Graves, MD;  Location: [facility] OR;  Service: Orthopedics;  Laterality: Left;   Social History: Social History   Socioeconomic History  . Marital status: Married    Spouse name: Not on file  . Number of children: 5  . Years of education: Not on file  . Highest education level: Not on file  Occupational History  . Not on file  Social Needs  . Financial resource strain: Not on file  . Food insecurity:    Worry: Not on file    Inability: Not on file  . Transportation needs:    Medical: Not on file    Non-medical: Not on file  Tobacco Use  . Smoking status: Former Smoker    Packs/day: 1.00    Years: 20.00    Pack years: 20.00    Types: Cigarettes    Last attempt to quit: 01/04/1993    Years since quitting: 24.7  . Smokeless tobacco: Former NeurosurgeonUser    Types: Chew    Quit date: 01/04/1993  . Tobacco comment: quit 1624yrs ago  Substance and Sexual Activity  . Alcohol use: No  . Drug use: No  . Sexual activity: Not Currently    Birth control/protection: None  Lifestyle  . Physical  activity:    Days per week: Not on file    Minutes per session: Not on file  . Stress: Not on file  Relationships  . Social connections:    Talks on phone: Not on file    Gets together: Not on file    Attends religious service: Not on file    Active member of club or organization: Not on file    Attends meetings of clubs or organizations: Not on file    Relationship status: Not on file  Other Topics Concern  . Not on file  Social History Narrative  . Not on file   Family History: Family History  Problem Relation Age of Onset  . Cancer Daughter   . Alzheimer's disease  Mother   . Lung cancer Father    Allergies: No Known Allergies Medications: See med rec.  Review of Systems: No fevers, chills, night sweats, weight loss, chest pain, or shortness of breath.   Objective:    General: Well Developed, well nourished, and in no acute distress.  Neuro: Alert and oriented x3, extra-ocular muscles intact, sensation grossly intact.  HEENT: Normocephalic, atraumatic, pupils equal round reactive to light, neck supple, no masses, no lymphadenopathy, thyroid nonpalpable.  Skin: Warm and dry, no rashes. Cardiac: Regular rate and rhythm, no murmurs rubs or gallops, no lower extremity edema.  Respiratory: Clear to auscultation bilaterally. Not using accessory muscles, speaking in full sentences.  Impression and Recommendations:    Spinal cord compression (HCC) Status post multilevel thoracic decompression. He had a 5-day prednisone course that provided fantastic relief of his leg weakness, when the prednisone ended some of the weakness return. I am going to prolong this course with a 12-day taper. Follow-up in 2 weeks.  Hematuria He did have an episode of hematuria with mucus, likely from urethral trauma secondary to Foley catheter. Fever to 102 Fahrenheit yesterday, feels good today, no urinary symptoms. We are going to get a urinalysis and urine culture, obtained here and sent to the lab. If spikes another fever we will add antibiotics.  Pansensitive E. coli, best sensitivity is with levofloxacin, 7 days sent in.  Insomnia Increasing trazodone to 100 mg nightly.  I spent 25 minutes with this patient, greater than 50% was face-to-face time counseling regarding the above diagnoses ___________________________________________ Steven Patton. Steven Patton, M.D., ABFM., CAQSM. Primary Care and Sports Medicine  [facility]  Adjunct Instructor of Family Medicine  [employer]

## 2017-10-03 LAB — URINALYSIS W MICROSCOPIC + REFLEX CULTURE
Bilirubin Urine: NEGATIVE
Glucose, UA: NEGATIVE
Hyaline Cast: NONE SEEN /LPF
Ketones, ur: NEGATIVE
Nitrites, Initial: POSITIVE — AB
Specific Gravity, Urine: 1.012 (ref 1.001–1.03)
pH: 6.5 (ref 5.0–8.0)

## 2017-10-03 LAB — URINE CULTURE
MICRO NUMBER:: 91046524
SPECIMEN QUALITY:: ADEQUATE

## 2017-10-03 LAB — CULTURE INDICATED

## 2017-10-06 MED ORDER — LEVOFLOXACIN 750 MG PO TABS: 750.0000 mg | ORAL_TABLET | Freq: Every day | ORAL | 0 refills | Status: DC

## 2017-10-06 NOTE — Addendum Note (Signed)
Addended by: Monica Becton on: 10/06/2017 12:46 PM   Modules accepted: Orders

## 2017-10-18 ENCOUNTER — Ambulatory Visit (INDEPENDENT_AMBULATORY_CARE_PROVIDER_SITE_OTHER): Payer: Medicare FFS | Admitting: Sports Medicine

## 2017-10-18 ENCOUNTER — Encounter: Payer: Self-pay | Admitting: Sports Medicine

## 2017-10-18 DIAGNOSIS — R319 Hematuria, unspecified: Secondary | ICD-10-CM | POA: Diagnosis not present

## 2017-10-18 DIAGNOSIS — I10 Essential (primary) hypertension: Secondary | ICD-10-CM

## 2017-10-18 NOTE — Assessment & Plan Note (Signed)
Off of torsemide and spironolactone, blood pressure is running a bit high today. Did have some azotemia. Rechecking CMP, CBC today.

## 2017-10-18 NOTE — Progress Notes (Signed)
Subjective:    CC: Follow-up  HPI: UTI: Pansensitive E. coli, treated with levofloxacin, symptoms resolved.  Renal insufficiency: Need to recheck.  He has been off of his Demadex and Spironolactone.  Hypertension: Improved on recheck.  I reviewed the past medical history, family history, social history, surgical history, and allergies today and no changes were needed.  Please see the problem list section below in epic for further details.  Past Medical History: Past Medical History:  Diagnosis Date  . Arthritis   . Cataract   . Hypertension    takes Amlodipine,Lisinopril,and Metoprolol daily  . Lower extremity weakness    Bilateral  . Macular degeneration, left eye   . Sleep apnea    2008.Marland Kitchen.Marland Kitchen.WEARS CPAP   Past Surgical History: Past Surgical History:  Procedure Laterality Date  . BACK SURGERY    . CARDIAC CATHETERIZATION  06-10-12  . COLONOSCOPY    . EYE SURGERY     CATARACT RIGHT EYE  . KNEE SURGERY    . LUMBAR LAMINECTOMY/DECOMPRESSION MICRODISCECTOMY N/A 11/02/2012   Procedure: LUMBAR LAMINECTOMY/DECOMPRESSION MICRODISCECTOMY 2 LEVEL;  Surgeon: Emilee HeroMark Leonard Dumonski, MD;  [facility];  Service: Orthopedics;  Laterality: N/A;  Lumbar 4-5,lumbar 5-sacrum 1 decompression  . LUMBAR LAMINECTOMY/DECOMPRESSION MICRODISCECTOMY N/A 09/08/2017   Procedure: THORACIC 11-12 DECOMPRESSION;  Surgeon: Estill Bambergumonski, Mark, MD;  [facility];  Service: Orthopedics;  Laterality: N/A;  . TONSILLECTOMY    . TOTAL HIP ARTHROPLASTY Left 01/06/2013   Procedure: LEFT TOTAL HIP ARTHROPLASTY-Posterior approach;  Surgeon: Harvie JuniorJohn L Graves, MD;  [facility];  Service: Orthopedics;  Laterality: Left;  . TOTAL HIP ARTHROPLASTY Right 04/21/2013   Procedure: TOTAL HIP ARTHROPLASTY ANTERIOR APPROACH;  Surgeon: Harvie JuniorJohn L Graves, MD;  [facility];  Service: Orthopedics;  Laterality: Right;  . TOTAL KNEE ARTHROPLASTY Left 09/16/2012   Procedure: TOTAL KNEE ARTHROPLASTY;  Surgeon: Harvie JuniorJohn L Graves, MD;  [facility] OR;  Service: Orthopedics;  Laterality: Left;   Social History: Social History   Socioeconomic History  . Marital status: Married    Spouse name: Not on file  . Number of children: 5  . Years of education: Not on file  . Highest education level: Not on file  Occupational History  . Not on file  Social Needs  . Financial resource strain: Not on file  . Food insecurity:    Worry: Not on file    Inability: Not on file  . Transportation needs:    Medical: Not on file    Non-medical: Not on file  Tobacco Use  . Smoking status: Former Smoker    Packs/day: 1.00    Years: 20.00    Pack years: 20.00    Types: Cigarettes    Last attempt to quit: 01/04/1993    Years since quitting: 24.8  . Smokeless tobacco: Former NeurosurgeonUser    Types: Chew    Quit date: 01/04/1993  . Tobacco comment: quit 4872yrs ago  Substance and Sexual Activity  . Alcohol use: No  . Drug use: No  . Sexual activity: Not Currently    Birth control/protection: None  Lifestyle  . Physical activity:    Days per week: Not on file    Minutes per session: Not on file  . Stress: Not on file  Relationships  . Social connections:    Talks on phone: Not on file    Gets together: Not on file    Attends religious service: Not on file    Active member of club or organization:  Not on file    Attends meetings of clubs or organizations: Not on file    Relationship status: Not on file  Other Topics Concern  . Not on file  Social History Narrative  . Not on file   Family History: Family History  Problem Relation Age of Onset  . Cancer Daughter   . Alzheimer's disease Mother   . Lung cancer Father    Allergies: No Known Allergies Medications: See med rec.  Review of Systems: No fevers, chills, night sweats, weight loss, chest pain, or shortness of breath.   Objective:    General: Well Developed, well nourished, and in no acute distress.  Neuro: Alert and oriented x3, extra-ocular muscles intact, sensation grossly  intact.  HEENT: Normocephalic, atraumatic, pupils equal round reactive to light, neck supple, no masses, no lymphadenopathy, thyroid nonpalpable.  Skin: Warm and dry, no rashes. Cardiac: Regular rate and rhythm, no murmurs rubs or gallops, no lower extremity edema.  Respiratory: Clear to auscultation bilaterally. Not using accessory muscles, speaking in full sentences.  Impression and Recommendations:    Hypertension Off of torsemide and spironolactone, blood pressure is running a bit high today. Did have some azotemia. Rechecking CMP, CBC today.  Hematuria Symptoms much better with 7-day course of levofloxacin, he did grow to 100,000 colonies of pansensitive E. coli.  ___________________________________________ Steven Patton. [name], M.D., ABFM., CAQSM. Primary Care and Sports Medicine [place] [facility]  Adjunct Instructor of Family Medicine  [employer]

## 2017-10-18 NOTE — Assessment & Plan Note (Signed)
Symptoms much better with 7-day course of levofloxacin, he did grow to 100,000 colonies of pansensitive E. coli.

## 2017-10-21 LAB — CBC
HCT: 38.4 % — ABNORMAL LOW (ref 38.5–50.0)
Hemoglobin: 12.9 g/dL — ABNORMAL LOW (ref 13.2–17.1)
MCH: 30.9 pg (ref 27.0–33.0)
MCHC: 33.6 g/dL (ref 32.0–36.0)
MCV: 92.1 fL (ref 80.0–100.0)
MPV: 9.7 fL (ref 7.5–12.5)
Platelets: 145 Thousand/uL (ref 140–400)
RBC: 4.17 Million/uL — ABNORMAL LOW (ref 4.20–5.80)
RDW: 13.6 % (ref 11.0–15.0)
WBC: 11.2 10*3/uL — ABNORMAL HIGH (ref 3.8–10.8)

## 2017-10-21 LAB — COMPREHENSIVE METABOLIC PANEL
AG Ratio: 1.9 (calc) (ref 1.0–2.5)
ALT: 22 U/L (ref 9–46)
Alkaline phosphatase (APISO): 42 U/L (ref 40–115)
BUN: 22 mg/dL (ref 7–25)
CO2: 29 mmol/L (ref 20–32)
Calcium: 8.8 mg/dL (ref 8.6–10.3)
Potassium: 4 mmol/L (ref 3.5–5.3)
Sodium: 139 mmol/L (ref 135–146)
Total Bilirubin: 0.4 mg/dL (ref 0.2–1.2)
Total Protein: 5.6 g/dL — ABNORMAL LOW (ref 6.1–8.1)

## 2017-10-21 LAB — COMPREHENSIVE METABOLIC PANEL WITH GFR
AST: 13 U/L (ref 10–35)
Albumin: 3.7 g/dL (ref 3.6–5.1)
BUN/Creatinine Ratio: 16 (calc) (ref 6–22)
Chloride: 100 mmol/L (ref 98–110)
Creat: 1.35 mg/dL — ABNORMAL HIGH (ref 0.70–1.11)
Globulin: 1.9 g/dL (ref 1.9–3.7)
Glucose, Bld: 223 mg/dL — ABNORMAL HIGH (ref 65–99)

## 2017-10-24 ENCOUNTER — Other Ambulatory Visit: Payer: Self-pay | Admitting: Sports Medicine

## 2017-10-24 DIAGNOSIS — I10 Essential (primary) hypertension: Secondary | ICD-10-CM

## 2017-10-24 DIAGNOSIS — M109 Gout, unspecified: Secondary | ICD-10-CM

## 2017-11-04 ENCOUNTER — Telehealth: Payer: Self-pay | Admitting: *Deleted

## 2017-11-04 DIAGNOSIS — D649 Anemia, unspecified: Secondary | ICD-10-CM

## 2017-11-04 LAB — CBC
HCT: 40.9 % (ref 38.5–50.0)
Hemoglobin: 13.7 g/dL (ref 13.2–17.1)
MCH: 31.1 pg (ref 27.0–33.0)
MCHC: 33.5 g/dL (ref 32.0–36.0)
MCV: 93 fL (ref 80.0–100.0)
MPV: 9.4 fL (ref 7.5–12.5)
Platelets: 116 Thousand/uL — ABNORMAL LOW (ref 140–400)
RBC: 4.4 Million/uL (ref 4.20–5.80)
RDW: 13.8 % (ref 11.0–15.0)
WBC: 8.7 10*3/uL (ref 3.8–10.8)

## 2017-11-04 NOTE — Telephone Encounter (Signed)
CBC ordered to recheck anemia.

## 2017-11-11 ENCOUNTER — Telehealth: Payer: Self-pay

## 2017-11-11 DIAGNOSIS — R8281 Pyuria: Secondary | ICD-10-CM | POA: Insufficient documentation

## 2017-11-11 NOTE — Telephone Encounter (Signed)
If that is the case then I am going to need a urinalysis with microscopic, reflex to culture, orders placed, he can go down to the lab and deliver the specimen.

## 2017-11-11 NOTE — Telephone Encounter (Signed)
Steven Patton states that pt complains of some dizziness and leg swelling from knee down. No other SX at this time. Advised her to keep pt hydrated and call if any changes

## 2017-11-11 NOTE — Telephone Encounter (Signed)
Pt was treated for UTI about 1 months ago, but his daughter whom he lives with called because of concern with white discharged in his urine and also coming out of penis after urinating. Steven Patton, pt's daughter, is concerned if this may be yeast or some type of infections. Discharge has jsut started and pt has been off antibiotics from his UTI for several weeks. No other SX noted.   Does pt need OV?  Please advise.

## 2017-11-11 NOTE — Assessment & Plan Note (Signed)
Urinalysis with microscopic reflex.

## 2017-11-11 NOTE — Telephone Encounter (Signed)
[name] advised. Will have pt go to lab for sample

## 2017-11-11 NOTE — Telephone Encounter (Signed)
Recently new onset- happening some since UTI. Not frequent, only happens on occasion such as when standing up etc.

## 2017-11-11 NOTE — Telephone Encounter (Signed)
Is this new dizziness or just baseline dizziness?

## 2017-11-11 NOTE — Telephone Encounter (Signed)
Is he symptomatic at ALL?  Urgency, burning, frequency, fevers, chills, flank pain?  If not then this can simply be watched.  Hydrate aggressively in the meantime to flush urethra.

## 2017-11-13 ENCOUNTER — Other Ambulatory Visit: Payer: Self-pay | Admitting: Sports Medicine

## 2017-11-13 MED ORDER — CEPHALEXIN 500 MG PO CAPS: 500.0000 mg | ORAL_CAPSULE | Freq: Two times a day (BID) | ORAL | 0 refills | Status: AC

## 2017-11-14 LAB — URINALYSIS W MICROSCOPIC + REFLEX CULTURE
Bilirubin Urine: NEGATIVE
Hyaline Cast: NONE SEEN /LPF
Ketones, ur: NEGATIVE
Nitrites, Initial: NEGATIVE
Specific Gravity, Urine: 1.016 (ref 1.001–1.03)
Squamous Epithelial / HPF: NONE SEEN /HPF (ref <=5)
WBC, UA: >=60 /HPF — AB (ref 0–5)
pH: 5.5 (ref 5.0–8.0)

## 2017-11-14 LAB — CULTURE INDICATED

## 2017-11-14 LAB — URINE CULTURE
MICRO NUMBER:: 91230217
SPECIMEN QUALITY:: ADEQUATE

## 2017-11-23 ENCOUNTER — Other Ambulatory Visit: Payer: Self-pay | Admitting: Sports Medicine

## 2017-11-23 DIAGNOSIS — E876 Hypokalemia: Secondary | ICD-10-CM

## 2017-11-23 DIAGNOSIS — N189 Chronic kidney disease, unspecified: Secondary | ICD-10-CM

## 2017-12-06 ENCOUNTER — Ambulatory Visit: Payer: Medicare FFS | Admitting: Sports Medicine

## 2017-12-06 ENCOUNTER — Encounter: Payer: Self-pay | Admitting: Sports Medicine

## 2017-12-06 DIAGNOSIS — E1165 Type 2 diabetes mellitus with hyperglycemia: Secondary | ICD-10-CM | POA: Diagnosis not present

## 2017-12-06 DIAGNOSIS — H8113 Benign paroxysmal vertigo, bilateral: Secondary | ICD-10-CM | POA: Diagnosis not present

## 2017-12-06 DIAGNOSIS — R739 Hyperglycemia, unspecified: Secondary | ICD-10-CM

## 2017-12-06 DIAGNOSIS — H8110 Benign paroxysmal vertigo, unspecified ear: Secondary | ICD-10-CM | POA: Insufficient documentation

## 2017-12-06 DIAGNOSIS — R8281 Pyuria: Secondary | ICD-10-CM

## 2017-12-06 NOTE — Progress Notes (Addendum)
Subjective:    CC: Feeling odd  HPI: This is a pleasant 82 year old male, history of several urinary tract infections.  He generally does well with short courses, more recently has felt somewhat tired, mild dysuria.  He also has had worsening positional vertigo, feels a spinning sensation and off balance whenever he tilts his head to the side.  No headaches, visual changes, chest pain, shortness of breath.  I reviewed the past medical history, family history, social history, surgical history, and allergies today and no changes were needed.  Please see the problem list section below in epic for further details.  Past Medical History: Past Medical History:  Diagnosis Date  . Arthritis   . Cataract   . Hypertension    takes Amlodipine,Lisinopril,and Metoprolol daily  . Lower extremity weakness    Bilateral  . Macular degeneration, left eye   . Sleep apnea    2008.Marland KitchenMarland KitchenWEARS CPAP   Past Surgical History: Past Surgical History:  Procedure Laterality Date  . BACK SURGERY    . CARDIAC CATHETERIZATION  06-10-12  . COLONOSCOPY    . EYE SURGERY     CATARACT RIGHT EYE  . KNEE SURGERY    . LUMBAR LAMINECTOMY/DECOMPRESSION MICRODISCECTOMY N/A 11/02/2012   Procedure: LUMBAR LAMINECTOMY/DECOMPRESSION MICRODISCECTOMY 2 LEVEL;  Surgeon: Emilee Hero, MD;  Location: [facility] OR;  Service: Orthopedics;  Laterality: N/A;  Lumbar 4-5,lumbar 5-sacrum 1 decompression  . LUMBAR LAMINECTOMY/DECOMPRESSION MICRODISCECTOMY N/A 09/08/2017   Procedure: THORACIC 11-12 DECOMPRESSION;  Surgeon: Estill Bamberg, MD;  Location: [facility] OR;  Service: Orthopedics;  Laterality: N/A;  . TONSILLECTOMY    . TOTAL HIP ARTHROPLASTY Left 01/06/2013   Procedure: LEFT TOTAL HIP ARTHROPLASTY-Posterior approach;  Surgeon: Harvie Junior, MD;  Location: [facility] OR;  Service: Orthopedics;  Laterality: Left;  . TOTAL HIP ARTHROPLASTY Right 04/21/2013   Procedure: TOTAL HIP ARTHROPLASTY ANTERIOR APPROACH;  Surgeon: Harvie Junior, MD;  Location:  [facility] OR;  Service: Orthopedics;  Laterality: Right;  . TOTAL KNEE ARTHROPLASTY Left 09/16/2012   Procedure: TOTAL KNEE ARTHROPLASTY;  Surgeon: Harvie Junior, MD;  Location: [facility] OR;  Service: Orthopedics;  Laterality: Left;   Social History: Social History   Socioeconomic History  . Marital status: Married    Spouse name: Not on file  . Number of children: 5  . Years of education: Not on file  . Highest education level: Not on file  Occupational History  . Not on file  Social Needs  . Financial resource strain: Not on file  . Food insecurity:    Worry: Not on file    Inability: Not on file  . Transportation needs:    Medical: Not on file    Non-medical: Not on file  Tobacco Use  . Smoking status: Former Smoker    Packs/day: 1.00    Years: 20.00    Pack years: 20.00    Types: Cigarettes    Last attempt to quit: 01/04/1993    Years since quitting: 24.9  . Smokeless tobacco: Former Neurosurgeon    Types: Chew    Quit date: 01/04/1993  . Tobacco comment: quit 3yrs ago  Substance and Sexual Activity  . Alcohol use: No  . Drug use: No  . Sexual activity: Not Currently    Birth control/protection: None  Lifestyle  . Physical activity:    Days per week: Not on file    Minutes per session: Not on file  . Stress: Not on file  Relationships  . Social connections:  Talks on phone: Not on file    Gets together: Not on file    Attends religious service: Not on file    Active member of club or organization: Not on file    Attends meetings of clubs or organizations: Not on file    Relationship status: Not on file  Other Topics Concern  . Not on file  Social History Narrative  . Not on file   Family History: Family History  Problem Relation Age of Onset  . Cancer Daughter   . Alzheimer's disease Mother   . Lung cancer Father    Allergies: No Known Allergies Medications: See med rec.  Review of Systems: No fevers, chills, night sweats, weight loss, chest pain, or shortness of  breath.   Objective:    General: Well Developed, well nourished, and in no acute distress.  Neuro: Alert and oriented x3, extra-ocular muscles intact, sensation grossly intact.  HEENT: Normocephalic, atraumatic, pupils equal round reactive to light, neck supple, no masses, no lymphadenopathy, thyroid nonpalpable.  Oropharynx, nasopharynx, ear canals unremarkable. Skin: Warm and dry, no rashes. Cardiac: Regular rate and rhythm, no murmurs rubs or gallops, no lower extremity edema.  Respiratory: Clear to auscultation bilaterally. Not using accessory muscles, speaking in full sentences. Abdomen: Soft, nontender, nondistended, bowel sounds, no palpable masses, guarding, rigidity, rebound tenderness.  Impression and Recommendations:    Pyuria Some recurrence of UTI symptoms, feeling weak. Checking blood work, blood cultures, urinalysis and urine culture. If positive we will add a longer course of Keflex, he likely has chronic prostatitis.  Vertigo, benign paroxysmal Referral to vestibular physical therapy.  Hyperglycemia Worsening hyperglycemia, please add a hemoglobin A1c, orders placed.  Diabetes mellitus type 2, uncontrolled (HCC) Hemoglobin A1c is up to 10.8, this represents uncontrolled diabetes, it also may be why he is feeling so bad, we need to start a GLP-1 medication, this is a once a week injection, I would like to see him in the office to discuss this. Will probably use Ozempic or Trulicity. ___________________________________________ [name]. [name], M.D., ABFM., CAQSM. Primary Care and Sports Medicine [place] [facility]  Adjunct Professor of Family Medicine  [employer]

## 2017-12-06 NOTE — Assessment & Plan Note (Signed)
Referral to vestibular physical therapy.

## 2017-12-06 NOTE — Assessment & Plan Note (Signed)
Some recurrence of UTI symptoms, feeling weak. Checking blood work, blood cultures, urinalysis and urine culture. If positive we will add a longer course of Keflex, he likely has chronic prostatitis.

## 2017-12-07 DIAGNOSIS — R739 Hyperglycemia, unspecified: Secondary | ICD-10-CM | POA: Insufficient documentation

## 2017-12-07 NOTE — Addendum Note (Signed)
Addended by: Monica Becton on: 12/07/2017 08:32 AM   Modules accepted: Orders

## 2017-12-07 NOTE — Assessment & Plan Note (Signed)
Worsening hyperglycemia, please add a hemoglobin A1c, orders placed.

## 2017-12-09 ENCOUNTER — Encounter: Payer: Self-pay | Admitting: Sports Medicine

## 2017-12-09 DIAGNOSIS — E1169 Type 2 diabetes mellitus with other specified complication: Secondary | ICD-10-CM | POA: Insufficient documentation

## 2017-12-09 DIAGNOSIS — E1122 Type 2 diabetes mellitus with diabetic chronic kidney disease: Secondary | ICD-10-CM | POA: Insufficient documentation

## 2017-12-09 DIAGNOSIS — E1165 Type 2 diabetes mellitus with hyperglycemia: Secondary | ICD-10-CM | POA: Insufficient documentation

## 2017-12-09 DIAGNOSIS — E669 Obesity, unspecified: Secondary | ICD-10-CM | POA: Insufficient documentation

## 2017-12-09 NOTE — Assessment & Plan Note (Signed)
Hemoglobin A1c is up to 10.8, this represents uncontrolled diabetes, it also may be why he is feeling so bad, we need to start a GLP-1 medication, this is a once a week injection, I would like to see him in the office to discuss this. Will probably use Ozempic or Trulicity.

## 2017-12-10 ENCOUNTER — Telehealth: Payer: Self-pay | Admitting: Sports Medicine

## 2017-12-10 NOTE — Telephone Encounter (Signed)
Done

## 2017-12-10 NOTE — Telephone Encounter (Signed)
Pts daughter request that Amber call her back in reference to patient:Steven Patton

## 2017-12-13 ENCOUNTER — Ambulatory Visit: Payer: Medicare FFS | Admitting: Physical Therapy

## 2017-12-13 ENCOUNTER — Encounter: Payer: Self-pay | Admitting: Physical Therapy

## 2017-12-13 DIAGNOSIS — R2681 Unsteadiness on feet: Secondary | ICD-10-CM | POA: Diagnosis not present

## 2017-12-13 DIAGNOSIS — R42 Dizziness and giddiness: Secondary | ICD-10-CM

## 2017-12-13 NOTE — Therapy (Signed)
[facility] [place] [address] [place], [place], [address] Phone: [phone]   Fax:  [phone]  Physical Therapy Evaluation  Patient Details  Name: Steven Patton MRN: [id number] Date of Birth: 19-Jun-1935 Referring Provider (PT): Monica Becton, MD   Encounter Date: 12/13/2017  PT End of Session - 12/13/17 1312    Visit Number  1    Number of Visits  6    Date for PT Re-Evaluation  01/24/18    Authorization Type  [employer]    PT Start Time  1140    PT Stop Time  1225    PT Time Calculation (min)  45 min    Activity Tolerance  Patient tolerated treatment well    Behavior During Therapy  [facility] for tasks assessed/performed       Past Medical History:  Diagnosis Date  . Arthritis   . Cataract   . Hypertension    takes Amlodipine,Lisinopril,and Metoprolol daily  . Lower extremity weakness    Bilateral  . Macular degeneration, left eye   . Sleep apnea    2008.Marland KitchenMarland KitchenWEARS CPAP    Past Surgical History:  Procedure Laterality Date  . BACK SURGERY    . CARDIAC CATHETERIZATION  06-10-12  . COLONOSCOPY    . EYE SURGERY     CATARACT RIGHT EYE  . KNEE SURGERY    . LUMBAR LAMINECTOMY/DECOMPRESSION MICRODISCECTOMY N/A 11/02/2012   Procedure: LUMBAR LAMINECTOMY/DECOMPRESSION MICRODISCECTOMY 2 LEVEL;  Surgeon: Emilee Hero, MD;  Location: [facility] OR;  Service: Orthopedics;  Laterality: N/A;  Lumbar 4-5,lumbar 5-sacrum 1 decompression  . LUMBAR LAMINECTOMY/DECOMPRESSION MICRODISCECTOMY N/A 09/08/2017   Procedure: THORACIC 11-12 DECOMPRESSION;  Surgeon: Estill Bamberg, MD;  [facility] OR;  Service: Orthopedics;  Laterality: N/A;  . TONSILLECTOMY    . TOTAL HIP ARTHROPLASTY Left 01/06/2013   Procedure: LEFT TOTAL HIP ARTHROPLASTY-Posterior approach;  Surgeon: Harvie Junior, MD;  Location: [facility] OR;  Service: Orthopedics;  Laterality: Left;  . TOTAL HIP ARTHROPLASTY Right 04/21/2013   Procedure: TOTAL HIP ARTHROPLASTY ANTERIOR  APPROACH;  Surgeon: Harvie Junior, MD;  Location: [facility] OR;  Service: Orthopedics;  Laterality: Right;  . TOTAL KNEE ARTHROPLASTY Left 09/16/2012   Procedure: TOTAL KNEE ARTHROPLASTY;  Surgeon: Harvie Junior, MD;  Location: [facility] OR;  Service: Orthopedics;  Laterality: Left;    There were no vitals filed for this visit.   Subjective Assessment - 12/13/17 1145    Subjective  Pt is an 82 y/o male who presents to OPPT for vertigo.  Pt reports symptoms when sitting up, but constant throughout the day.  Pt with hx of multiple UTIs x 2-3 months, and feels dizziness started around this time.    Patient is accompained by:  Family member   daughter   Patient Stated Goals  improve dizziness    Currently in Pain?  No/denies         [facility] PT Assessment - 12/13/17 1147      Assessment   Medical Diagnosis  vertigo    Referring Provider (PT)  Monica Becton, MD    Onset Date/Surgical Date  --   2-3 months   Next MD Visit  12/14/17    Prior Therapy  none      Precautions   Precautions  Fall      Restrictions   Weight Bearing Restrictions  No      Balance Screen   Has the patient fallen in the past 6 months  No  Has the patient had a decrease in activity level because of a fear of falling?   Yes    Is the patient reluctant to leave their home because of a fear of falling?   No      Home Environment   Living Environment  Private residence    Living Arrangements  Spouse/significant other;Children   and son-in-law   Available Help at Discharge  Family;Available 24 hours/day    Type of Home  House      Prior Function   Level of Independence  Needs assistance with ADLs    Vocation  Retired    Leisure  "sit in my chair and watch TV" very sedentary lifestyle    Comments  min ambulation in home (none currently due to dizziness)      Cognition   Overall Cognitive Status  Within Functional Limits for tasks assessed      Transfers   Transfers  Sit to Stand;Stand to Sit;Stand Pivot  Transfers    Sit to Stand  4: Min assist;From elevated surface;With upper extremity assist    Sit to Stand Details (indicate cue type and reason)  pt pulls up on RW to stand; assist needed to stabilize walker    Stand to Sit  4: Min guard;With upper extremity assist    Stand Pivot Transfers  4: Min guard    Transfer Cueing  with RW           Vestibular Assessment - 12/13/17 1150      Vestibular Assessment   General Observation  no symptoms at rest; reports occasional double and blurry vision      Symptom Behavior   Type of Dizziness  Lightheadedness   "floating" denies spinning'   Frequency of Dizziness  every morning, all day    Duration of Dizziness  variable    Aggravating Factors  Activity in general    Relieving Factors  No known relieving factors   sit down and rest     Occulomotor Exam   Occulomotor Alignment  Normal    Spontaneous  Absent    Gaze-induced  Absent    Smooth Pursuits  Intact    Saccades  Intact      Vestibulo-Occular Reflex   VOR 1 Head Only (x 1 viewing)  WNL with increase in symptoms      Positional Testing   Sidelying Test  Sidelying Right;Sidelying Left      Sidelying Right   Sidelying Right Duration  none    Sidelying Right Symptoms  No nystagmus      Sidelying Left   Sidelying Left Duration  mild symptoms 2 sec; c/o lightheadedness    Sidelying Left Symptoms  No nystagmus      Positional Sensitivities   Sit to Supine  Lightheadedness    Supine to Sitting  Mild dizziness    Head Turning x 5  Mild dizziness    Head Nodding x 5  Lightheadedness    Pivot Right in Standing  Mild dizziness    Pivot Left in Standing  Lightheadedness      Orthostatics   BP sitting  127/71    HR sitting  59    BP standing (after 1 minute)  137/80    HR standing (after 1 minute)  65          Objective measurements completed on examination: See above findings.      [facility] Adult PT Treatment/Exercise - 12/13/17 1253      Self-Care  Self-Care   Other Self-Care Comments    Other Self-Care Comments   discussed clinical findings, and likely sypmtoms of motion sensitivity.  educated pt, daughter, and wife on HEP and need for increased activity at home within safe environment.      Vestibular Treatment/Exercise - 12/13/17 1250      Vestibular Treatment/Exercise   Vestibular Treatment Provided  Gaze    Gaze Exercises  X1 Viewing Horizontal;X1 Viewing Vertical      X1 Viewing Horizontal   Foot Position  seated    Time  --   30 sec   Reps  1    Comments  mild increase in symptoms      X1 Viewing Vertical   Foot Position  seated    Time  --   30 sec   Reps  1    Comments  mostly asymptomatic            PT Education - 12/13/17 1307    Education Details  HEP    Person(s) Educated  Patient;Spouse    Methods  Explanation;Demonstration;Handout    Comprehension  Verbalized understanding;Need further instruction;Returned demonstration          PT Long Term Goals - 12/13/17 1323      PT LONG TERM GOAL #1   Title  independent with HEP    Status  New    Target Date  01/24/18      PT LONG TERM GOAL #2   Title  report 50% improvement in symptoms for improved mobility    Status  New    Target Date  01/24/18      PT LONG TERM GOAL #3   Title  family to report 50% improvement in functional mobility at home for improved function    Status  New    Target Date  01/24/18             Plan - 12/13/17 1307    Clinical Impression Statement  Pt is an 82 y/o male who presents to OPPT for c/o vertigo.  Pt demonstrates negative positional testing, and complicated medical history indicating symptoms likley multifactorial.  Pt with recent hx of multiple UTIs, which has limited pt's mobility recently and with new medical dx of DM which all could be contributing to symptoms.  Orthostatics negative today, but only checked sitting to standing due to decreased mobility.  Recommended at this time, to safely increase activity and  initiated gaze stabilization for pt.  Will plan to follow up weekly to progress HEP PRN.      History and Personal Factors relevant to plan of care:  recentl UTIs, DM, HTN, macular degeneration, OSA    Clinical Presentation  Evolving    Clinical Presentation due to:  symptoms multifactorial, atypical presentation of dizziness    Clinical Decision Making  Moderate    Rehab Potential  Fair    Clinical Impairments Affecting Rehab Potential  significant functional limitations    PT Frequency  1x / week    PT Treatment/Interventions  ADLs/Self Care Home Management;Canalith Repostioning;Electrical Stimulation;Cryotherapy;Ultrasound;Moist Heat;Gait training;DME Instruction;Functional mobility training;Therapeutic activities;Therapeutic exercise;Balance training;Neuromuscular re-education;Patient/family education;Vestibular    PT Next Visit Plan  review gaze, reassess PRN, progress vestibular/functional mobility exercises    PT Home Exercise Plan  Access Code: [email or web address]    Consulted and Agree with Plan of Care  Patient;Family member/caregiver    Family Member Consulted  wife, daughter       Patient will benefit from skilled therapeutic intervention in  order to improve the following deficits and impairments:  Dizziness, Decreased mobility, Decreased activity tolerance  Visit Diagnosis: Dizziness and giddiness - Plan: PT plan of care cert/re-cert  Unsteadiness on feet - Plan: PT plan of care cert/re-cert     Problem List Patient Active Problem List   Diagnosis Date Noted  . Diabetes mellitus type 2, uncontrolled (HCC) 12/09/2017  . Vertigo, benign paroxysmal 12/06/2017  . Pyuria 11/11/2017  . Hematuria 10/01/2017  . Myelopathy (HCC) 09/08/2017  . Spinal cord compression (HCC) 08/23/2017  . Insomnia 05/24/2017  . Hypokalemia 05/12/2017  . S/P cervical spinal fusion 09/28/2016  . Pneumonia 09/28/2016  . Gout 06/17/2015  . Osteoarthritis of right hip 04/21/2013  . Obesity 06/20/2012  .  H/O left total hip arthroplasty 05/18/2012  . Lumbar stenosis with neurogenic claudication 01/29/2012  . Knee osteoarthritis 01/18/2012  . Hypertension 01/04/2012  . Chronic renal insufficiency 01/04/2012  . Hyperlipidemia 01/04/2012  . Annual physical exam 01/04/2012  . Sleep apnea 01/04/2012      Clarita Crane, PT, DPT 12/13/17 1:32 PM     [facility] [address] [place], [place], [address] Phone: [phone]   Fax:  [phone]  Name: Steven Patton MRN: [id number] Date of Birth: 01-16-1936

## 2017-12-13 NOTE — Patient Instructions (Signed)
Access Code: [email or web address]  URL: [email or web address]  Date: 12/13/2017  Prepared by: Moshe Cipro   Exercises  Seated Gaze Stabilization with Head Nod - 1 sets - 30 Seconds - 2x daily - 7x weekly

## 2017-12-14 ENCOUNTER — Ambulatory Visit: Payer: Medicare FFS | Admitting: Sports Medicine

## 2017-12-14 ENCOUNTER — Encounter: Payer: Self-pay | Admitting: Sports Medicine

## 2017-12-14 DIAGNOSIS — E1165 Type 2 diabetes mellitus with hyperglycemia: Secondary | ICD-10-CM

## 2017-12-14 DIAGNOSIS — R8281 Pyuria: Secondary | ICD-10-CM

## 2017-12-14 LAB — CBC WITH DIFFERENTIAL/PLATELET
Basophils Absolute: 85 cells/uL (ref 0–200)
Basophils Relative: 0.9 %
Eosinophils Absolute: 461 {cells}/uL (ref 15–500)
Eosinophils Relative: 4.9 %
HCT: 41.7 % (ref 38.5–50.0)
Hemoglobin: 14.1 g/dL (ref 13.2–17.1)
Lymphs Abs: 1871 cells/uL (ref 850–3900)
MCH: 31.4 pg (ref 27.0–33.0)
MCHC: 33.8 g/dL (ref 32.0–36.0)
MCV: 92.9 fL (ref 80.0–100.0)
MPV: 10.4 fL (ref 7.5–12.5)
Monocytes Relative: 10.9 %
Neutro Abs: 5960 cells/uL (ref 1500–7800)
Neutrophils Relative %: 63.4 %
Platelets: 127 10*3/uL — ABNORMAL LOW (ref 140–400)
RBC: 4.49 Million/uL (ref 4.20–5.80)
RDW: 13.5 % (ref 11.0–15.0)
Total Lymphocyte: 19.9 %
WBC mixed population: 1025 {cells}/uL — ABNORMAL HIGH (ref 200–950)
WBC: 9.4 10*3/uL (ref 3.8–10.8)

## 2017-12-14 LAB — COMPREHENSIVE METABOLIC PANEL WITH GFR
ALT: 15 U/L (ref 9–46)
Albumin: 4.2 g/dL (ref 3.6–5.1)
BUN/Creatinine Ratio: 13 (calc) (ref 6–22)
BUN: 16 mg/dL (ref 7–25)
Creat: 1.22 mg/dL — ABNORMAL HIGH (ref 0.70–1.11)
Glucose, Bld: 276 mg/dL — ABNORMAL HIGH (ref 65–99)
Potassium: 4.6 mmol/L (ref 3.5–5.3)
Sodium: 136 mmol/L (ref 135–146)
Total Protein: 6.6 g/dL (ref 6.1–8.1)

## 2017-12-14 LAB — URINALYSIS W MICROSCOPIC + REFLEX CULTURE
Bacteria, UA: NONE SEEN /HPF
Bilirubin Urine: NEGATIVE
Hgb urine dipstick: NEGATIVE
Hyaline Cast: NONE SEEN /LPF
Ketones, ur: NEGATIVE
Leukocyte Esterase: NEGATIVE
Nitrites, Initial: NEGATIVE
Specific Gravity, Urine: 1.022 (ref 1.001–1.03)
pH: 5.5 (ref 5.0–8.0)

## 2017-12-14 LAB — PSA, TOTAL AND FREE
PSA, % Free: 40 % (ref >25)
PSA, Free: 0.6 ng/mL
PSA, Total: 1.5 ng/mL (ref <=4.0)

## 2017-12-14 LAB — TEST AUTHORIZATION

## 2017-12-14 LAB — CULTURE, BLOOD (SINGLE)
MICRO NUMBER:: 91327205
SPECIMEN QUALITY:: ADEQUATE

## 2017-12-14 LAB — HEMOGLOBIN A1C W/OUT EAG: Hgb A1c MFr Bld: 10.8 % of total Hgb — ABNORMAL HIGH (ref <5.7)

## 2017-12-14 LAB — COMPREHENSIVE METABOLIC PANEL
AG Ratio: 1.8 (calc) (ref 1.0–2.5)
AST: 15 U/L (ref 10–35)
Alkaline phosphatase (APISO): 45 U/L (ref 40–115)
CO2: 23 mmol/L (ref 20–32)
Calcium: 9.5 mg/dL (ref 8.6–10.3)
Chloride: 102 mmol/L (ref 98–110)
Globulin: 2.4 g/dL (calc) (ref 1.9–3.7)
Total Bilirubin: 0.6 mg/dL (ref 0.2–1.2)

## 2017-12-14 LAB — NO CULTURE INDICATED

## 2017-12-14 MED ORDER — BLOOD GLUCOSE MONITOR KIT: PACK | 0 refills | Status: AC

## 2017-12-14 MED ORDER — DULAGLUTIDE 1.5 MG/0.5ML ~~LOC~~ SOAJ: 1.5000 mg | SUBCUTANEOUS | 11 refills | Status: DC

## 2017-12-14 MED ORDER — FREESTYLE LIBRE 14 DAY SENSOR MISC: 1.0000 | 11 refills | Status: DC

## 2017-12-14 NOTE — Assessment & Plan Note (Signed)
Blood cultures just came back today positive for gram-positive bacilli. Not completely sure that I believe this, he is completely stable here in the office. He will return today for blood cultures x2 stat.

## 2017-12-14 NOTE — Addendum Note (Signed)
 Addended by: Monica BectonHEKKEKANDAM,  J on: 12/14/2017 05:10 PM   Modules accepted: Orders

## 2017-12-14 NOTE — Progress Notes (Addendum)
Subjective:    CC: Discuss diabetes  HPI: Steven Patton is a pleasant 82 year old male, he has felt bad over the past several months, progressive paresthesias in both hands, weakness, a CMP showed hyperglycemia and an A1c showed 10.8%.  He is here for further discussion.  I reviewed the past medical history, family history, social history, surgical history, and allergies today and no changes were needed.  Please see the problem list section below in epic for further details.  Past Medical History: Past Medical History:  Diagnosis Date  . Arthritis   . Cataract   . Hypertension    takes Amlodipine,Lisinopril,and Metoprolol daily  . Lower extremity weakness    Bilateral  . Macular degeneration, left eye   . Sleep apnea    2008.Marland KitchenMarland KitchenWEARS CPAP   Past Surgical History: Past Surgical History:  Procedure Laterality Date  . BACK SURGERY    . CARDIAC CATHETERIZATION  06-10-12  . COLONOSCOPY    . EYE SURGERY     CATARACT RIGHT EYE  . KNEE SURGERY    . LUMBAR LAMINECTOMY/DECOMPRESSION MICRODISCECTOMY N/A 11/02/2012   Procedure: LUMBAR LAMINECTOMY/DECOMPRESSION MICRODISCECTOMY 2 LEVEL;  Surgeon: Emilee Hero, MD;  Location: [facility] OR;  Service: Orthopedics;  Laterality: N/A;  Lumbar 4-5,lumbar 5-sacrum 1 decompression  . LUMBAR LAMINECTOMY/DECOMPRESSION MICRODISCECTOMY N/A 09/08/2017   Procedure: THORACIC 11-12 DECOMPRESSION;  Surgeon: Estill Bamberg, MD;  Location: [facility] OR;  Service: Orthopedics;  Laterality: N/A;  . TONSILLECTOMY    . TOTAL HIP ARTHROPLASTY Left 01/06/2013   Procedure: LEFT TOTAL HIP ARTHROPLASTY-Posterior approach;  Surgeon: Harvie Junior, MD;  Location: [facility] OR;  Service: Orthopedics;  Laterality: Left;  . TOTAL HIP ARTHROPLASTY Right 04/21/2013   Procedure: TOTAL HIP ARTHROPLASTY ANTERIOR APPROACH;  Surgeon: Harvie Junior, MD;  Location: [facility] OR;  Service: Orthopedics;  Laterality: Right;  . TOTAL KNEE ARTHROPLASTY Left 09/16/2012   Procedure: TOTAL KNEE ARTHROPLASTY;  Surgeon:  Harvie Junior, MD;  Location: [facility] OR;  Service: Orthopedics;  Laterality: Left;   Social History: Social History   Socioeconomic History  . Marital status: Married    Spouse name: Not on file  . Number of children: 5  . Years of education: Not on file  . Highest education level: Not on file  Occupational History  . Not on file  Social Needs  . Financial resource strain: Not on file  . Food insecurity:    Worry: Not on file    Inability: Not on file  . Transportation needs:    Medical: Not on file    Non-medical: Not on file  Tobacco Use  . Smoking status: Former Smoker    Packs/day: 1.00    Years: 20.00    Pack years: 20.00    Types: Cigarettes    Last attempt to quit: 01/04/1993    Years since quitting: 24.9  . Smokeless tobacco: Former Neurosurgeon    Types: Chew    Quit date: 01/04/1993  . Tobacco comment: quit 4yrs ago  Substance and Sexual Activity  . Alcohol use: No  . Drug use: No  . Sexual activity: Not Currently    Birth control/protection: None  Lifestyle  . Physical activity:    Days per week: Not on file    Minutes per session: Not on file  . Stress: Not on file  Relationships  . Social connections:    Talks on phone: Not on file    Gets together: Not on file    Attends religious service: Not on file  Active member of club or organization: Not on file    Attends meetings of clubs or organizations: Not on file    Relationship status: Not on file  Other Topics Concern  . Not on file  Social History Narrative  . Not on file   Family History: Family History  Problem Relation Age of Onset  . Cancer Daughter   . Alzheimer's disease Mother   . Lung cancer Father    Allergies: No Known Allergies Medications: See med rec.  Review of Systems: No fevers, chills, night sweats, weight loss, chest pain, or shortness of breath.   Objective:    General: Well Developed, well nourished, and in no acute distress.  Neuro: Alert and oriented x3, extra-ocular  muscles intact, sensation grossly intact.  HEENT: Normocephalic, atraumatic, pupils equal round reactive to light, neck supple, no masses, no lymphadenopathy, thyroid nonpalpable.  Skin: Warm and dry, no rashes. Cardiac: Regular rate and rhythm, no murmurs rubs or gallops, no lower extremity edema.  Respiratory: Clear to auscultation bilaterally. Not using accessory muscles, speaking in full sentences.  Impression and Recommendations:    Diabetes mellitus type 2, uncontrolled (HCC) Hemoglobin A1c up to 10.8. Adding Trulicity. We may need to switch to Bydureon depending on what his insurance covers. Adding a freestyle libre blood sugar meter, his daughter will install the app and check his sugars at least once every morning. Return to see me in 3 months. They will avoid consumption of all carbohydrates.  Freestyle libre not covered, switching to standard glucometer.  Pyuria Blood cultures just came back today positive for gram-positive bacilli. Not completely sure that I believe this, he is completely stable here in the office. He will return today for blood cultures x2 stat.  I spent 25 minutes with this patient, greater than 50% was face-to-face time counseling regarding the above diagnoses, specifically the pathophysiology of diabetes and the treatment approach. ___________________________________________ [name]. [name], M.D., ABFM., CAQSM. Primary Care and Sports Medicine [facility] [address]  Adjunct Professor of Family Medicine  [employer]

## 2017-12-14 NOTE — Assessment & Plan Note (Addendum)
Hemoglobin A1c up to 10.8. Adding Trulicity. We may need to switch to Bydureon depending on what his insurance covers. Adding a freestyle libre blood sugar meter, his daughter will install the app and check his sugars at least once every morning. Return to see me in 3 months. They will avoid consumption of all carbohydrates.  Freestyle libre not covered, switching to standard glucometer.

## 2017-12-14 NOTE — Addendum Note (Signed)
Addended by: Monica BectonHEKKEKANDAM, Kelilah Hebard J on: 12/14/2017 01:58 PM   Modules accepted: Orders

## 2017-12-15 ENCOUNTER — Other Ambulatory Visit: Payer: Self-pay | Admitting: Sports Medicine

## 2017-12-15 NOTE — Addendum Note (Signed)
Addended by: Monica BectonHEKKEKANDAM, Rendy Lazard J on: 12/15/2017 10:40 AM   Modules accepted: Orders

## 2017-12-20 ENCOUNTER — Ambulatory Visit: Payer: Medicare FFS | Admitting: Sports Medicine

## 2017-12-20 ENCOUNTER — Encounter: Payer: Medicare FFS | Admitting: Physical Therapy

## 2017-12-21 LAB — CULTURE, BLOOD (SINGLE)

## 2017-12-23 ENCOUNTER — Other Ambulatory Visit: Payer: Self-pay | Admitting: Sports Medicine

## 2017-12-23 DIAGNOSIS — F5101 Primary insomnia: Secondary | ICD-10-CM

## 2017-12-31 ENCOUNTER — Other Ambulatory Visit: Payer: Self-pay | Admitting: Sports Medicine

## 2017-12-31 DIAGNOSIS — F5101 Primary insomnia: Secondary | ICD-10-CM

## 2018-01-10 ENCOUNTER — Other Ambulatory Visit: Payer: Self-pay | Admitting: Sports Medicine

## 2018-01-31 ENCOUNTER — Other Ambulatory Visit: Payer: Self-pay | Admitting: Sports Medicine

## 2018-01-31 DIAGNOSIS — R601 Generalized edema: Secondary | ICD-10-CM

## 2018-01-31 DIAGNOSIS — R600 Localized edema: Secondary | ICD-10-CM

## 2018-02-03 ENCOUNTER — Encounter: Payer: Self-pay | Admitting: Sports Medicine

## 2018-02-03 DIAGNOSIS — N189 Chronic kidney disease, unspecified: Secondary | ICD-10-CM

## 2018-02-03 NOTE — Telephone Encounter (Signed)
Labs ordered.

## 2018-02-15 LAB — COMPLETE METABOLIC PANEL WITH GFR
Albumin: 4.5 g/dL (ref 3.6–5.1)
Alkaline phosphatase (APISO): 45 U/L (ref 40–115)
BUN/Creatinine Ratio: 10 (calc) (ref 6–22)
BUN: 15 mg/dL (ref 7–25)
Chloride: 100 mmol/L (ref 98–110)
Globulin: 2.4 g/dL (calc) (ref 1.9–3.7)
Glucose, Bld: 121 mg/dL — ABNORMAL HIGH (ref 65–99)
Sodium: 143 mmol/L (ref 135–146)
Total Bilirubin: 0.6 mg/dL (ref 0.2–1.2)

## 2018-02-15 LAB — COMPLETE METABOLIC PANEL WITHOUT GFR
AG Ratio: 1.9 (calc) (ref 1.0–2.5)
ALT: 24 U/L (ref 9–46)
AST: 22 U/L (ref 10–35)
CO2: 28 mmol/L (ref 20–32)
Calcium: 10 mg/dL (ref 8.6–10.3)
Creat: 1.5 mg/dL — ABNORMAL HIGH (ref 0.70–1.11)
GFR, Est African American: 50 mL/min/1.73m2 — ABNORMAL LOW (ref >=60)
GFR, Est Non African American: 43 mL/min/1.73m2 — ABNORMAL LOW (ref >=60)
Potassium: 3.9 mmol/L (ref 3.5–5.3)
Total Protein: 6.9 g/dL (ref 6.1–8.1)

## 2018-02-22 ENCOUNTER — Other Ambulatory Visit: Payer: Self-pay | Admitting: Sports Medicine

## 2018-03-14 ENCOUNTER — Encounter: Payer: Self-pay | Admitting: Sports Medicine

## 2018-03-14 ENCOUNTER — Ambulatory Visit: Payer: Medicare FFS | Admitting: Sports Medicine

## 2018-03-14 VITALS — BP 123/68 | HR 67 | Ht 69.0 in | Wt 321.0 lb

## 2018-03-14 DIAGNOSIS — E1165 Type 2 diabetes mellitus with hyperglycemia: Secondary | ICD-10-CM

## 2018-03-14 LAB — POCT GLYCOSYLATED HEMOGLOBIN (HGB A1C): Hemoglobin A1C: 6.3 % — AB (ref 4.0–5.6)

## 2018-03-14 NOTE — Progress Notes (Signed)
Subjective:    CC: Diabetes check  HPI: Steven Patton returns, his A1c was 10.8% at the last visit, we started Trulicity, he is here for recheck.  Feels much better.  I reviewed the past medical history, family history, social history, surgical history, and allergies today and no changes were needed.  Please see the problem list section below in epic for further details.  Past Medical History: Past Medical History:  Diagnosis Date  . Arthritis   . Cataract   . Hypertension    takes Amlodipine,Lisinopril,and Metoprolol daily  . Lower extremity weakness    Bilateral  . Macular degeneration, left eye   . Sleep apnea    2008.Marland KitchenMarland KitchenWEARS CPAP   Past Surgical History: Past Surgical History:  Procedure Laterality Date  . BACK SURGERY    . CARDIAC CATHETERIZATION  06-10-12  . COLONOSCOPY    . EYE SURGERY     CATARACT RIGHT EYE  . KNEE SURGERY    . LUMBAR LAMINECTOMY/DECOMPRESSION MICRODISCECTOMY N/A 11/02/2012   Procedure: LUMBAR LAMINECTOMY/DECOMPRESSION MICRODISCECTOMY 2 LEVEL;  Surgeon: Emilee Hero, MD;  Location: [facility] OR;  Service: Orthopedics;  Laterality: N/A;  Lumbar 4-5,lumbar 5-sacrum 1 decompression  . LUMBAR LAMINECTOMY/DECOMPRESSION MICRODISCECTOMY N/A 09/08/2017   Procedure: THORACIC 11-12 DECOMPRESSION;  Surgeon: Estill Bamberg, MD;  [facility]: [facility] OR;  Service: Orthopedics;  Laterality: N/A;  . TONSILLECTOMY    . TOTAL HIP ARTHROPLASTY Left 01/06/2013   Procedure: LEFT TOTAL HIP ARTHROPLASTY-Posterior approach;  Surgeon: Harvie Junior, MD;  Location: [facility] OR;  Service: Orthopedics;  Laterality: Left;  . TOTAL HIP ARTHROPLASTY Right 04/21/2013   Procedure: TOTAL HIP ARTHROPLASTY ANTERIOR APPROACH;  Surgeon: Harvie Junior, MD;  [facility] OR;  Service: Orthopedics;  Laterality: Right;  . TOTAL KNEE ARTHROPLASTY Left 09/16/2012   Procedure: TOTAL KNEE ARTHROPLASTY;  Surgeon: Harvie Junior, MD;  Location: [facility] OR;  Service: Orthopedics;  Laterality: Left;   Social History: Social  History   Socioeconomic History  . Marital status: Married    Spouse name: Not on file  . Number of children: 5  . Years of education: Not on file  . Highest education level: Not on file  Occupational History  . Not on file  Social Needs  . Financial resource strain: Not on file  . Food insecurity:    Worry: Not on file    Inability: Not on file  . Transportation needs:    Medical: Not on file    Non-medical: Not on file  Tobacco Use  . Smoking status: Former Smoker    Packs/day: 1.00    Years: 20.00    Pack years: 20.00    Types: Cigarettes    Last attempt to quit: 01/04/1993    Years since quitting: 25.2  . Smokeless tobacco: Former Neurosurgeon    Types: Chew    Quit date: 01/04/1993  . Tobacco comment: quit 71yrs ago  Substance and Sexual Activity  . Alcohol use: No  . Drug use: No  . Sexual activity: Not Currently    Birth control/protection: None  Lifestyle  . Physical activity:    Days per week: Not on file    Minutes per session: Not on file  . Stress: Not on file  Relationships  . Social connections:    Talks on phone: Not on file    Gets together: Not on file    Attends religious service: Not on file    Active member of club or organization: Not on file    Attends  meetings of clubs or organizations: Not on file    Relationship status: Not on file  Other Topics Concern  . Not on file  Social History Narrative  . Not on file   Family History: Family History  Problem Relation Age of Onset  . Cancer Daughter   . Alzheimer's disease Mother   . Lung cancer Father    Allergies: No Known Allergies Medications: See med rec.  Review of Systems: No fevers, chills, night sweats, weight loss, chest pain, or shortness of breath.   Objective:    General: Well Developed, well nourished, and in no acute distress.  Neuro: Alert and oriented x3, extra-ocular muscles intact, sensation grossly intact.  HEENT: Normocephalic, atraumatic, pupils equal round reactive to  light, neck supple, no masses, no lymphadenopathy, thyroid nonpalpable.  Skin: Warm and dry, no rashes. Cardiac: Regular rate and rhythm, no murmurs rubs or gallops, no lower extremity edema.  Respiratory: Clear to auscultation bilaterally. Not using accessory muscles, speaking in full sentences.  Hemoglobin A1c =6.3%.  Impression and Recommendations:    Diabetes mellitus type 2, uncontrolled (HCC) Hemoglobin A1c dropped from 10.8% down to 6.3% with the addition of Trulicity. He is also lost almost 20 pounds. Continue to avoid consumption of all carbohydrates. Return to see me in 3 months.  ___________________________________________ [name]. [name], M.D., ABFM., CAQSM. Primary Care and Sports Medicine [facility]  Adjunct Professor of Family Medicine  [employer]

## 2018-03-14 NOTE — Assessment & Plan Note (Signed)
Hemoglobin A1c dropped from 10.8% down to 6.3% with the addition of Trulicity. He is also lost almost 20 pounds. Continue to avoid consumption of all carbohydrates. Return to see me in 3 months.

## 2018-03-24 ENCOUNTER — Encounter: Payer: Self-pay | Admitting: Sports Medicine

## 2018-03-24 ENCOUNTER — Ambulatory Visit: Payer: Medicare FFS | Admitting: Sports Medicine

## 2018-03-24 DIAGNOSIS — R531 Weakness: Secondary | ICD-10-CM | POA: Insufficient documentation

## 2018-03-24 NOTE — Assessment & Plan Note (Signed)
In this debilitated 83 year old male with multiple medical problems. Historically he has taken on this occurred prior to a previous episode of urosepsis that landed him in the ICU. Checking labs, CBC, CMP, TSH, urinalysis, urine culture, blood cultures. Cardiopulmonary exam is normal today, he is not volume overloaded, his vitals are stable and he is afebrile.

## 2018-03-24 NOTE — Progress Notes (Signed)
Subjective:    CC: Weakness  HPI: This is a pleasant 83 year old male, he has multiple medical problems, history of urosepsis.  Over the past several days he has had increasing weakness, increasing reliance on his wheelchair, no focal neurologic symptoms, no fevers, chills, no polyuria, urgency, frequency.  His family feels as though this is how he looked before his prior episode of urosepsis and would like him evaluated aggressively.  I reviewed the past medical history, family history, social history, surgical history, and allergies today and no changes were needed.  Please see the problem list section below in epic for further details.  Past Medical History: Past Medical History:  Diagnosis Date  . Arthritis   . Cataract   . Hypertension    takes Amlodipine,Lisinopril,and Metoprolol daily  . Lower extremity weakness    Bilateral  . Macular degeneration, left eye   . Sleep apnea    2008.Marland KitchenMarland KitchenWEARS CPAP   Past Surgical History: Past Surgical History:  Procedure Laterality Date  . BACK SURGERY    . CARDIAC CATHETERIZATION  06-10-12  . COLONOSCOPY    . EYE SURGERY     CATARACT RIGHT EYE  . KNEE SURGERY    . LUMBAR LAMINECTOMY/DECOMPRESSION MICRODISCECTOMY N/A 11/02/2012   Procedure: LUMBAR LAMINECTOMY/DECOMPRESSION MICRODISCECTOMY 2 LEVEL;  Surgeon: Emilee Hero, MD;  Location: [facility] OR;  Service: Orthopedics;  Laterality: N/A;  Lumbar 4-5,lumbar 5-sacrum 1 decompression  . LUMBAR LAMINECTOMY/DECOMPRESSION MICRODISCECTOMY N/A 09/08/2017   Procedure: THORACIC 11-12 DECOMPRESSION;  Surgeon: Estill Bamberg, MD;  [facility]: [facility] OR;  Service: Orthopedics;  Laterality: N/A;  . TONSILLECTOMY    . TOTAL HIP ARTHROPLASTY Left 01/06/2013   Procedure: LEFT TOTAL HIP ARTHROPLASTY-Posterior approach;  Surgeon: Harvie Junior, MD;  Location: [facility] OR;  Service: Orthopedics;  Laterality: Left;  . TOTAL HIP ARTHROPLASTY Right 04/21/2013   Procedure: TOTAL HIP ARTHROPLASTY ANTERIOR APPROACH;  Surgeon:  Harvie Junior, MD;  [facility] OR;  Service: Orthopedics;  Laterality: Right;  . TOTAL KNEE ARTHROPLASTY Left 09/16/2012   Procedure: TOTAL KNEE ARTHROPLASTY;  Surgeon: Harvie Junior, MD;  [facility] OR;  Service: Orthopedics;  Laterality: Left;   Social History: Social History   Socioeconomic History  . Marital status: Married    Spouse name: Not on file  . Number of children: 5  . Years of education: Not on file  . Highest education level: Not on file  Occupational History  . Not on file  Social Needs  . Financial resource strain: Not on file  . Food insecurity:    Worry: Not on file    Inability: Not on file  . Transportation needs:    Medical: Not on file    Non-medical: Not on file  Tobacco Use  . Smoking status: Former Smoker    Packs/day: 1.00    Years: 20.00    Pack years: 20.00    Types: Cigarettes    Last attempt to quit: 01/04/1993    Years since quitting: 25.2  . Smokeless tobacco: Former Neurosurgeon    Types: Chew    Quit date: 01/04/1993  . Tobacco comment: quit 66yrs ago  Substance and Sexual Activity  . Alcohol use: No  . Drug use: No  . Sexual activity: Not Currently    Birth control/protection: None  Lifestyle  . Physical activity:    Days per week: Not on file    Minutes per session: Not on file  . Stress: Not on file  Relationships  . Social connections:  Talks on phone: Not on file    Gets together: Not on file    Attends religious service: Not on file    Active member of club or organization: Not on file    Attends meetings of clubs or organizations: Not on file    Relationship status: Not on file  Other Topics Concern  . Not on file  Social History Narrative  . Not on file   Family History: Family History  Problem Relation Age of Onset  . Cancer Daughter   . Alzheimer's disease Mother   . Lung cancer Father    Allergies: No Known Allergies Medications: See med rec.  Review of Systems: No fevers, chills, night sweats, weight loss,  chest pain, or shortness of breath.   Objective:    General: Well Developed, well nourished, and in no acute distress.  Neuro: Alert and oriented x3, extra-ocular muscles intact, sensation grossly intact.  Cranial nerves II through XII are intact, motor, sensory functions are intact, no pronator drift, no facial droop, voice sounds tired but no dysarthria or slurring. HEENT: Normocephalic, atraumatic, pupils equal round reactive to light, neck supple, no masses, no lymphadenopathy, thyroid nonpalpable.  Skin: Warm and dry, no rashes. Cardiac: Regular rate and rhythm, no murmurs rubs or gallops, no lower extremity edema.  Respiratory: Clear to auscultation bilaterally. Not using accessory muscles, speaking in full sentences. Abdomen: Soft, nontender, nondistended normal bowel sounds, no palpable masses, no guarding, rigidity, rebound tenderness.  No costovertebral angle pain, no suprapubic pain.  Impression and Recommendations:    Weakness In this debilitated 83 year old male with multiple medical problems. Historically he has taken on this occurred prior to a previous episode of urosepsis that landed him in the ICU. Checking labs, CBC, CMP, TSH, urinalysis, urine culture, blood cultures. Cardiopulmonary exam is normal today, he is not volume overloaded, his vitals are stable and he is afebrile.  ___________________________________________ Steven Patton. [name], M.D., ABFM., CAQSM. Primary Care and Sports Medicine [facility]  Adjunct Professor of Family Medicine  [employer]

## 2018-03-31 LAB — CBC WITH DIFFERENTIAL/PLATELET
Absolute Monocytes: 1415 {cells}/uL — ABNORMAL HIGH (ref 200–950)
Basophils Absolute: 81 cells/uL (ref 0–200)
Basophils Relative: 0.7 %
Eosinophils Absolute: 483 cells/uL (ref 15–500)
Eosinophils Relative: 4.2 %
HCT: 43.5 % (ref 38.5–50.0)
Hemoglobin: 14.6 g/dL (ref 13.2–17.1)
Lymphs Abs: 2553 {cells}/uL (ref 850–3900)
MCH: 30.3 pg (ref 27.0–33.0)
MCHC: 33.6 g/dL (ref 32.0–36.0)
MCV: 90.2 fL (ref 80.0–100.0)
MPV: 10.5 fL (ref 7.5–12.5)
Monocytes Relative: 12.3 %
Neutro Abs: 6969 {cells}/uL (ref 1500–7800)
Neutrophils Relative %: 60.6 %
Platelets: 152 10*3/uL (ref 140–400)
RBC: 4.82 Million/uL (ref 4.20–5.80)
RDW: 14.6 % (ref 11.0–15.0)
Total Lymphocyte: 22.2 %
WBC: 11.5 Thousand/uL — ABNORMAL HIGH (ref 3.8–10.8)

## 2018-03-31 LAB — URINALYSIS W MICROSCOPIC + REFLEX CULTURE
Bacteria, UA: NONE SEEN /HPF
Bilirubin Urine: NEGATIVE
Glucose, UA: NEGATIVE
Hgb urine dipstick: NEGATIVE
Hyaline Cast: NONE SEEN /LPF
Ketones, ur: NEGATIVE
Leukocyte Esterase: NEGATIVE
Nitrites, Initial: NEGATIVE
Protein, ur: NEGATIVE
RBC / HPF: NONE SEEN /HPF (ref 0–2)
Specific Gravity, Urine: 1.018 (ref 1.001–1.03)
Squamous Epithelial / HPF: NONE SEEN /HPF (ref <=5)
WBC, UA: NONE SEEN /HPF (ref 0–5)
pH: <=5 (ref 5.0–8.0)

## 2018-03-31 LAB — CK: Total CK: 165 U/L (ref 44–196)

## 2018-03-31 LAB — COMPREHENSIVE METABOLIC PANEL
AG Ratio: 2 (calc) (ref 1.0–2.5)
ALT: 15 U/L (ref 9–46)
AST: 14 U/L (ref 10–35)
Alkaline phosphatase (APISO): 45 U/L (ref 35–144)
BUN/Creatinine Ratio: 14 (calc) (ref 6–22)
BUN: 21 mg/dL (ref 7–25)
Calcium: 9.3 mg/dL (ref 8.6–10.3)
Glucose, Bld: 128 mg/dL — ABNORMAL HIGH (ref 65–99)
Potassium: 3.6 mmol/L (ref 3.5–5.3)
Sodium: 140 mmol/L (ref 135–146)
Total Bilirubin: 0.4 mg/dL (ref 0.2–1.2)

## 2018-03-31 LAB — CULTURE, BLOOD (SINGLE)
MICRO NUMBER:: 221038
Result:: NO GROWTH
SPECIMEN QUALITY:: ADEQUATE

## 2018-03-31 LAB — TSH: TSH: 3.1 m[IU]/L (ref 0.40–4.50)

## 2018-03-31 LAB — COMPREHENSIVE METABOLIC PANEL WITH GFR
Albumin: 4.3 g/dL (ref 3.6–5.1)
CO2: 22 mmol/L (ref 20–32)
Chloride: 102 mmol/L (ref 98–110)
Creat: 1.55 mg/dL — ABNORMAL HIGH (ref 0.70–1.11)
Globulin: 2.2 g/dL (ref 1.9–3.7)
Total Protein: 6.5 g/dL (ref 6.1–8.1)

## 2018-03-31 LAB — NO CULTURE INDICATED

## 2018-04-05 ENCOUNTER — Other Ambulatory Visit: Payer: Self-pay | Admitting: Adult Health

## 2018-04-05 ENCOUNTER — Other Ambulatory Visit: Payer: Self-pay | Admitting: Sports Medicine

## 2018-04-05 DIAGNOSIS — E876 Hypokalemia: Secondary | ICD-10-CM

## 2018-04-05 DIAGNOSIS — F5101 Primary insomnia: Secondary | ICD-10-CM

## 2018-04-05 DIAGNOSIS — M109 Gout, unspecified: Secondary | ICD-10-CM

## 2018-04-11 ENCOUNTER — Ambulatory Visit: Payer: Medicare FFS | Admitting: Sports Medicine

## 2018-06-07 ENCOUNTER — Encounter: Payer: Self-pay | Admitting: Sports Medicine

## 2018-06-13 ENCOUNTER — Ambulatory Visit: Payer: Medicare FFS | Admitting: Sports Medicine

## 2018-06-19 ENCOUNTER — Other Ambulatory Visit: Payer: Self-pay | Admitting: Sports Medicine

## 2018-06-19 DIAGNOSIS — I10 Essential (primary) hypertension: Secondary | ICD-10-CM

## 2018-06-20 ENCOUNTER — Other Ambulatory Visit: Payer: Self-pay | Admitting: Cardiology

## 2018-07-05 ENCOUNTER — Other Ambulatory Visit: Payer: Self-pay | Admitting: Sports Medicine

## 2018-07-05 DIAGNOSIS — F5101 Primary insomnia: Secondary | ICD-10-CM

## 2018-09-01 ENCOUNTER — Other Ambulatory Visit: Payer: Self-pay | Admitting: Sports Medicine

## 2018-09-01 DIAGNOSIS — I10 Essential (primary) hypertension: Secondary | ICD-10-CM

## 2018-09-05 ENCOUNTER — Other Ambulatory Visit: Payer: Self-pay | Admitting: Sports Medicine

## 2018-09-05 DIAGNOSIS — M109 Gout, unspecified: Secondary | ICD-10-CM

## 2018-09-05 MED ORDER — ALLOPURINOL 300 MG PO TABS: 300.0000 mg | ORAL_TABLET | Freq: Two times a day (BID) | ORAL | 1 refills | Status: DC

## 2018-09-07 ENCOUNTER — Other Ambulatory Visit: Payer: Self-pay | Admitting: *Deleted

## 2018-09-07 MED ORDER — HYDRALAZINE HCL 25 MG PO TABS: 25.0000 mg | ORAL_TABLET | Freq: Two times a day (BID) | ORAL | 0 refills | Status: DC

## 2018-09-23 ENCOUNTER — Ambulatory Visit (INDEPENDENT_AMBULATORY_CARE_PROVIDER_SITE_OTHER): Payer: Medicare PPO

## 2018-09-23 ENCOUNTER — Ambulatory Visit (INDEPENDENT_AMBULATORY_CARE_PROVIDER_SITE_OTHER): Payer: Medicare PPO | Admitting: Sports Medicine

## 2018-09-23 ENCOUNTER — Other Ambulatory Visit: Payer: Self-pay

## 2018-09-23 ENCOUNTER — Encounter: Payer: Self-pay | Admitting: Sports Medicine

## 2018-09-23 DIAGNOSIS — M109 Gout, unspecified: Secondary | ICD-10-CM

## 2018-09-23 DIAGNOSIS — M48062 Spinal stenosis, lumbar region with neurogenic claudication: Secondary | ICD-10-CM | POA: Diagnosis not present

## 2018-09-23 DIAGNOSIS — M1A0720 Idiopathic chronic gout, left ankle and foot, without tophus (tophi): Secondary | ICD-10-CM | POA: Diagnosis not present

## 2018-09-23 DIAGNOSIS — I10 Essential (primary) hypertension: Secondary | ICD-10-CM | POA: Diagnosis not present

## 2018-09-23 MED ORDER — KETOROLAC TROMETHAMINE 30 MG/ML IJ SOLN
30.0000 mg | Freq: Once | INTRAMUSCULAR | Status: AC
  Administered 2018-09-23: 30 mg via INTRAMUSCULAR

## 2018-09-23 MED ORDER — PREDNISONE 50 MG PO TABS: ORAL_TABLET | ORAL | 0 refills | Status: DC

## 2018-09-23 MED ORDER — HYDROCODONE-ACETAMINOPHEN 5-325 MG PO TABS: 1.0000 | ORAL_TABLET | Freq: Three times a day (TID) | ORAL | 0 refills | Status: DC | PRN

## 2018-09-23 MED ORDER — METHYLPREDNISOLONE SODIUM SUCC 125 MG IJ SOLR
125.0000 mg | Freq: Once | INTRAMUSCULAR | Status: AC
  Administered 2018-09-23: 125 mg via INTRAMUSCULAR

## 2018-09-23 NOTE — Assessment & Plan Note (Signed)
Checking routine labs, I performed a venipuncture myself.

## 2018-09-23 NOTE — Assessment & Plan Note (Signed)
Severe mid to upper lumbar pain. He is post thoracic decompression. Toradol 30, Solu-Medrol 125, prednisone, hydrocodone. X-rays.

## 2018-09-23 NOTE — Progress Notes (Addendum)
Subjective:    CC: Acute low back pain  HPI: Steven Patton is a pleasant 83 year old male, he has an extensive history of lumbar spinal stenosis, thoracic stenosis, he has had several decompressions.  Unfortunately he started to have recurrence of pain, mid to upper lumbar spine, severe, persistent but localized without radiation, no bowel or bladder dysfunction, saddle numbness, constitutional symptoms.  He is also due for routine labs.  He desires to have me draw the blood rather than our lab downstairs.  I reviewed the past medical history, family history, social history, surgical history, and allergies today and no changes were needed.  Please see the problem list section below in epic for further details.  Past Medical History: Past Medical History:  Diagnosis Date  . Arthritis   . Cataract   . Hypertension    takes Amlodipine,Lisinopril,and Metoprolol daily  . Lower extremity weakness    Bilateral  . Macular degeneration, left eye   . Sleep apnea    2008.Marland Kitchen.Marland Kitchen.WEARS CPAP   Past Surgical History: Past Surgical History:  Procedure Laterality Date  . BACK SURGERY    . CARDIAC CATHETERIZATION  06-10-12  . COLONOSCOPY    . EYE SURGERY     CATARACT RIGHT EYE  . KNEE SURGERY    . LUMBAR LAMINECTOMY/DECOMPRESSION MICRODISCECTOMY N/A 11/02/2012   Procedure: LUMBAR LAMINECTOMY/DECOMPRESSION MICRODISCECTOMY 2 LEVEL;  Surgeon: Emilee HeroMark Leonard Dumonski, MD;  [facility];  Service: Orthopedics;  Laterality: N/A;  Lumbar 4-5,lumbar 5-sacrum 1 decompression  . LUMBAR LAMINECTOMY/DECOMPRESSION MICRODISCECTOMY N/A 09/08/2017   Procedure: THORACIC 11-12 DECOMPRESSION;  Surgeon: Estill Bambergumonski, Mark, MD;  Location: [facility] OR;  Service: Orthopedics;  Laterality: N/A;  . TONSILLECTOMY    . TOTAL HIP ARTHROPLASTY Left 01/06/2013   Procedure: LEFT TOTAL HIP ARTHROPLASTY-Posterior approach;  Surgeon: Harvie JuniorJohn L Graves, MD;  [facility];  Service: Orthopedics;  Laterality: Left;  . TOTAL HIP ARTHROPLASTY Right 04/21/2013    Procedure: TOTAL HIP ARTHROPLASTY ANTERIOR APPROACH;  Surgeon: Harvie JuniorJohn L Graves, MD;  [facility] OR;  Service: Orthopedics;  Laterality: Right;  . TOTAL KNEE ARTHROPLASTY Left 09/16/2012   Procedure: TOTAL KNEE ARTHROPLASTY;  Surgeon: Harvie JuniorJohn L Graves, MD;  [facility] OR;  Service: Orthopedics;  Laterality: Left;   Social History: Social History   Socioeconomic History  . Marital status: Married    Spouse name: Not on file  . Number of children: 5  . Years of education: Not on file  . Highest education level: Not on file  Occupational History  . Not on file  Social Needs  . Financial resource strain: Not on file  . Food insecurity    Worry: Not on file    Inability: Not on file  . Transportation needs    Medical: Not on file    Non-medical: Not on file  Tobacco Use  . Smoking status: Former Smoker    Packs/day: 1.00    Years: 20.00    Pack years: 20.00    Types: Cigarettes    Quit date: 01/04/1993    Years since quitting: 25.7  . Smokeless tobacco: Former NeurosurgeonUser    Types: Chew    Quit date: 01/04/1993  . Tobacco comment: quit 3041yrs ago  Substance and Sexual Activity  . Alcohol use: No  . Drug use: No  . Sexual activity: Not Currently    Birth control/protection: None  Lifestyle  . Physical activity    Days per week: Not on file    Minutes per session: Not on file  . Stress:  Not on file  Relationships  . Social Herbalist on phone: Not on file    Gets together: Not on file    Attends religious service: Not on file    Active member of club or organization: Not on file    Attends meetings of clubs or organizations: Not on file    Relationship status: Not on file  Other Topics Concern  . Not on file  Social History Narrative  . Not on file   Family History: Family History  Problem Relation Age of Onset  . Cancer Daughter   . Alzheimer's disease Mother   . Lung cancer Father    Allergies: No Known Allergies Medications: See med rec.  Review of  Systems: No fevers, chills, night sweats, weight loss, chest pain, or shortness of breath.   Objective:    General: Well Developed, well nourished, and in no acute distress.  Neuro: Alert and oriented x3, extra-ocular muscles intact, sensation grossly intact.  HEENT: Normocephalic, atraumatic, pupils equal round reactive to light, neck supple, no masses, no lymphadenopathy, thyroid nonpalpable.  Skin: Warm and dry, no rashes. Cardiac: Regular rate and rhythm, no murmurs rubs or gallops, no lower extremity edema.  Respiratory: Clear to auscultation bilaterally. Not using accessory muscles, speaking in full sentences. Low back: Tender to palpation in the mid lumbar musculature.  Venipuncture performed in the right median cubital vein.  Toradol 30 and Solu-Medrol 125 given intramuscular.  Impression and Recommendations:    Lumbar stenosis with neurogenic claudication Severe mid to upper lumbar pain. He is post thoracic decompression. Toradol 30, Solu-Medrol 125, prednisone, hydrocodone. X-rays.   Hypertension Checking routine labs, I performed a venipuncture myself.   Gout Uric acid levels are very low, decreasing allopurinol to 300 mg once a day.  I spent 40 minutes with this patient, greater than 50% was face-to-face time counseling regarding the above diagnoses, this was separate from the time spent performing the venipuncture.  ___________________________________________ Gwen Her. Dianah Field, M.D., ABFM., CAQSM. Primary Care and Sports Medicine [facility]  Adjunct Professor of [employer]

## 2018-09-24 LAB — COMPLETE METABOLIC PANEL WITH GFR
AG Ratio: 2.1 (calc) (ref 1.0–2.5)
ALT: 16 U/L (ref 9–46)
AST: 16 U/L (ref 10–35)
Albumin: 4.4 g/dL (ref 3.6–5.1)
Alkaline phosphatase (APISO): 44 U/L (ref 35–144)
BUN/Creatinine Ratio: 11 (calc) (ref 6–22)
BUN: 17 mg/dL (ref 7–25)
CO2: 24 mmol/L (ref 20–32)
Calcium: 9.5 mg/dL (ref 8.6–10.3)
Chloride: 104 mmol/L (ref 98–110)
Creat: 1.51 mg/dL — ABNORMAL HIGH (ref 0.70–1.11)
GFR, Est African American: 49 mL/min/{1.73_m2} — ABNORMAL LOW (ref >=60)
GFR, Est Non African American: 43 mL/min/{1.73_m2} — ABNORMAL LOW (ref >=60)
Globulin: 2.1 g/dL (calc) (ref 1.9–3.7)
Glucose, Bld: 130 mg/dL — ABNORMAL HIGH (ref 65–99)
Potassium: 4.3 mmol/L (ref 3.5–5.3)
Sodium: 140 mmol/L (ref 135–146)
Total Bilirubin: 0.7 mg/dL (ref 0.2–1.2)
Total Protein: 6.5 g/dL (ref 6.1–8.1)

## 2018-09-24 LAB — CBC
HCT: 46.5 % (ref 38.5–50.0)
Hemoglobin: 15.4 g/dL (ref 13.2–17.1)
MCH: 31.2 pg (ref 27.0–33.0)
MCHC: 33.1 g/dL (ref 32.0–36.0)
MCV: 94.3 fL (ref 80.0–100.0)
MPV: 10.5 fL (ref 7.5–12.5)
Platelets: 135 10*3/uL — ABNORMAL LOW (ref 140–400)
RBC: 4.93 10*6/uL (ref 4.20–5.80)
RDW: 14.6 % (ref 11.0–15.0)
WBC: 11.1 10*3/uL — ABNORMAL HIGH (ref 3.8–10.8)

## 2018-09-24 LAB — HEMOGLOBIN A1C
Hgb A1c MFr Bld: 5.7 % of total Hgb — ABNORMAL HIGH (ref <5.7)
Mean Plasma Glucose: 117 (calc)
eAG (mmol/L): 6.5 (calc)

## 2018-09-24 LAB — LIPID PANEL W/REFLEX DIRECT LDL
Cholesterol: 95 mg/dL (ref <200)
HDL: 31 mg/dL — ABNORMAL LOW (ref >=40)
LDL Cholesterol (Calc): 44 mg/dL (calc)
Non-HDL Cholesterol (Calc): 64 mg/dL (calc) (ref <130)
Total CHOL/HDL Ratio: 3.1 (calc) (ref <5.0)
Triglycerides: 110 mg/dL (ref <150)

## 2018-09-24 LAB — URIC ACID: Uric Acid, Serum: 1.9 mg/dL — ABNORMAL LOW (ref 4.0–8.0)

## 2018-09-24 NOTE — Addendum Note (Signed)
Addended by: Silverio Decamp on: 09/24/2018 10:21 AM   Modules accepted: Orders

## 2018-09-24 NOTE — Assessment & Plan Note (Signed)
Uric acid levels are very low, decreasing allopurinol to 300 mg once a day.

## 2018-09-26 ENCOUNTER — Ambulatory Visit: Payer: Medicare FFS | Admitting: Sports Medicine

## 2018-10-07 ENCOUNTER — Ambulatory Visit (INDEPENDENT_AMBULATORY_CARE_PROVIDER_SITE_OTHER): Payer: Medicare PPO | Admitting: Sports Medicine

## 2018-10-07 ENCOUNTER — Other Ambulatory Visit: Payer: Self-pay

## 2018-10-07 ENCOUNTER — Encounter: Payer: Self-pay | Admitting: Sports Medicine

## 2018-10-07 VITALS — BP 130/71 | HR 62 | Ht 69.0 in | Wt 319.0 lb

## 2018-10-07 DIAGNOSIS — B356 Tinea cruris: Secondary | ICD-10-CM | POA: Diagnosis not present

## 2018-10-07 DIAGNOSIS — Z23 Encounter for immunization: Secondary | ICD-10-CM | POA: Diagnosis not present

## 2018-10-07 MED ORDER — CLOTRIMAZOLE-BETAMETHASONE 1-0.05 % EX CREA: 1.0000 "application " | TOPICAL_CREAM | Freq: Two times a day (BID) | CUTANEOUS | 0 refills | Status: DC

## 2018-10-07 NOTE — Progress Notes (Signed)
Subjective:    CC: Skin rash  HPI: Steven Patton is a pleasant 83 year old male, for some time now he is had a rash in his groin, between the scrotum and his upper thighs.  Minimally pruritic but for the most part asymptomatic.  Mild, persistent, localized without radiation.  I reviewed the past medical history, family history, social history, surgical history, and allergies today and no changes were needed.  Please see the problem list section below in epic for further details.  Past Medical History: Past Medical History:  Diagnosis Date  . Arthritis   . Cataract   . Hypertension    takes Amlodipine,Lisinopril,and Metoprolol daily  . Lower extremity weakness    Bilateral  . Macular degeneration, left eye   . Sleep apnea    2008.Marland KitchenMarland KitchenWEARS CPAP   Past Surgical History: Past Surgical History:  Procedure Laterality Date  . BACK SURGERY    . CARDIAC CATHETERIZATION  06-10-12  . COLONOSCOPY    . EYE SURGERY     CATARACT RIGHT EYE  . KNEE SURGERY    . LUMBAR LAMINECTOMY/DECOMPRESSION MICRODISCECTOMY N/A 11/02/2012   Procedure: LUMBAR LAMINECTOMY/DECOMPRESSION MICRODISCECTOMY 2 LEVEL;  Surgeon: Sinclair Ship, MD;  Location: [place];  Service: Orthopedics;  Laterality: N/A;  Lumbar 4-5,lumbar 5-sacrum 1 decompression  . LUMBAR LAMINECTOMY/DECOMPRESSION MICRODISCECTOMY N/A 09/08/2017   Procedure: THORACIC 11-12 DECOMPRESSION;  Surgeon: Phylliss Bob, MD;  Location: [place];  Service: Orthopedics;  Laterality: N/A;  . TONSILLECTOMY    . TOTAL HIP ARTHROPLASTY Left 01/06/2013   Procedure: LEFT TOTAL HIP ARTHROPLASTY-Posterior approach;  Surgeon: Alta Corning, MD;  Location: [place];  Service: Orthopedics;  Laterality: Left;  . TOTAL HIP ARTHROPLASTY Right 04/21/2013   Procedure: TOTAL HIP ARTHROPLASTY ANTERIOR APPROACH;  Surgeon: Alta Corning, MD;  Location: [place];  Service: Orthopedics;  Laterality: Right;  . TOTAL KNEE ARTHROPLASTY Left 09/16/2012   Procedure: TOTAL KNEE ARTHROPLASTY;  Surgeon:  Alta Corning, MD;  Location: [place];  Service: Orthopedics;  Laterality: Left;   Social History: Social History   Socioeconomic History  . Marital status: Married    Spouse name: Not on file  . Number of children: 5  . Years of education: Not on file  . Highest education level: Not on file  Occupational History  . Not on file  Social Needs  . Financial resource strain: Not on file  . Food insecurity    Worry: Not on file    Inability: Not on file  . Transportation needs    Medical: Not on file    Non-medical: Not on file  Tobacco Use  . Smoking status: Former Smoker    Packs/day: 1.00    Years: 20.00    Pack years: 20.00    Types: Cigarettes    Quit date: 01/04/1993    Years since quitting: 25.7  . Smokeless tobacco: Former Systems developer    Types: Chew    Quit date: 01/04/1993  . Tobacco comment: quit 73yrs ago  Substance and Sexual Activity  . Alcohol use: No  . Drug use: No  . Sexual activity: Not Currently    Birth control/protection: None  Lifestyle  . Physical activity    Days per week: Not on file    Minutes per session: Not on file  . Stress: Not on file  Relationships  . Social Herbalist on phone: Not on file    Gets together: Not on file    Attends religious service: Not on file  Active member of club or organization: Not on file    Attends meetings of clubs or organizations: Not on file    Relationship status: Not on file  Other Topics Concern  . Not on file  Social History Narrative  . Not on file   Family History: Family History  Problem Relation Age of Onset  . Cancer Daughter   . Alzheimer's disease Mother   . Lung cancer Father    Allergies: No Known Allergies Medications: See med rec.  Review of Systems: No fevers, chills, night sweats, weight loss, chest pain, or shortness of breath.   Objective:    General: Well Developed, well nourished, and in no acute distress.  Neuro: Alert and oriented x3, extra-ocular muscles intact,  sensation grossly intact.  HEENT: Normocephalic, atraumatic, pupils equal round reactive to light, neck supple, no masses, no lymphadenopathy, thyroid nonpalpable.  Skin: Warm and dry, tinea cruris.  No signs of bacterial superinfection. Cardiac: Regular rate and rhythm, no murmurs rubs or gallops, no lower extremity edema.  Respiratory: Clear to auscultation bilaterally. Not using accessory muscles, speaking in full sentences.  Impression and Recommendations:    Tinea cruris Adding topical Lotrisone to be done twice a day for 2 weeks.   ___________________________________________ Ihor Austinhomas J. Benjamin Stainhekkekandam, M.D., ABFM., CAQSM. Primary Care and Sports Medicine [facility]  Adjunct Professor of Family Medicine  [employer]

## 2018-10-07 NOTE — Assessment & Plan Note (Signed)
Adding topical Lotrisone to be done twice a day for 2 weeks.

## 2018-10-24 ENCOUNTER — Ambulatory Visit (INDEPENDENT_AMBULATORY_CARE_PROVIDER_SITE_OTHER): Payer: Medicare PPO | Admitting: Sports Medicine

## 2018-10-24 ENCOUNTER — Other Ambulatory Visit: Payer: Self-pay

## 2018-10-24 ENCOUNTER — Encounter: Payer: Self-pay | Admitting: Sports Medicine

## 2018-10-24 DIAGNOSIS — E1165 Type 2 diabetes mellitus with hyperglycemia: Secondary | ICD-10-CM | POA: Diagnosis not present

## 2018-10-24 MED ORDER — GLIPIZIDE 10 MG PO TABS: 10.0000 mg | ORAL_TABLET | Freq: Two times a day (BID) | ORAL | 3 refills | Status: DC

## 2018-10-24 MED ORDER — METFORMIN HCL 1000 MG PO TABS: 1000.0000 mg | ORAL_TABLET | Freq: Two times a day (BID) | ORAL | 3 refills | Status: DC

## 2018-10-24 NOTE — Progress Notes (Signed)
Subjective:    CC: Follow-up  HPI: Tinea cruris: Resolved with Lotrisone.  Diabetes mellitus type 2: Trulicity is getting too expensive, over $200 per month, on further review of his formulary all GLP-1's are at least tier 3.  I reviewed the past medical history, family history, social history, surgical history, and allergies today and no changes were needed.  Please see the problem list section below in epic for further details.  Past Medical History: Past Medical History:  Diagnosis Date  . Arthritis   . Cataract   . Hypertension    takes Amlodipine,Lisinopril,and Metoprolol daily  . Lower extremity weakness    Bilateral  . Macular degeneration, left eye   . Sleep apnea    2008.Marland KitchenMarland KitchenWEARS CPAP   Past Surgical History: Past Surgical History:  Procedure Laterality Date  . BACK SURGERY    . CARDIAC CATHETERIZATION  06-10-12  . COLONOSCOPY    . EYE SURGERY     CATARACT RIGHT EYE  . KNEE SURGERY    . LUMBAR LAMINECTOMY/DECOMPRESSION MICRODISCECTOMY N/A 11/02/2012   Procedure: LUMBAR LAMINECTOMY/DECOMPRESSION MICRODISCECTOMY 2 LEVEL;  Surgeon: Sinclair Ship, MD;  Location: [place];  Service: Orthopedics;  Laterality: N/A;  Lumbar 4-5,lumbar 5-sacrum 1 decompression  . LUMBAR LAMINECTOMY/DECOMPRESSION MICRODISCECTOMY N/A 09/08/2017   Procedure: THORACIC 11-12 DECOMPRESSION;  Surgeon: Phylliss Bob, MD;  Location: [place];  Service: Orthopedics;  Laterality: N/A;  . TONSILLECTOMY    . TOTAL HIP ARTHROPLASTY Left 01/06/2013   Procedure: LEFT TOTAL HIP ARTHROPLASTY-Posterior approach;  Surgeon: Alta Corning, MD;  Location: [place];  Service: Orthopedics;  Laterality: Left;  . TOTAL HIP ARTHROPLASTY Right 04/21/2013   Procedure: TOTAL HIP ARTHROPLASTY ANTERIOR APPROACH;  Surgeon: Alta Corning, MD;  Location: [place];  Service: Orthopedics;  Laterality: Right;  . TOTAL KNEE ARTHROPLASTY Left 09/16/2012   Procedure: TOTAL KNEE ARTHROPLASTY;  Surgeon: Alta Corning, MD;  Location: [place];   Service: Orthopedics;  Laterality: Left;   Social History: Social History   Socioeconomic History  . Marital status: Married    Spouse name: Not on file  . Number of children: 5  . Years of education: Not on file  . Highest education level: Not on file  Occupational History  . Not on file  Social Needs  . Financial resource strain: Not on file  . Food insecurity    Worry: Not on file    Inability: Not on file  . Transportation needs    Medical: Not on file    Non-medical: Not on file  Tobacco Use  . Smoking status: Former Smoker    Packs/day: 1.00    Years: 20.00    Pack years: 20.00    Types: Cigarettes    Quit date: 01/04/1993    Years since quitting: 25.8  . Smokeless tobacco: Former Systems developer    Types: Chew    Quit date: 01/04/1993  . Tobacco comment: quit 29yrs ago  Substance and Sexual Activity  . Alcohol use: No  . Drug use: No  . Sexual activity: Not Currently    Birth control/protection: None  Lifestyle  . Physical activity    Days per week: Not on file    Minutes per session: Not on file  . Stress: Not on file  Relationships  . Social Herbalist on phone: Not on file    Gets together: Not on file    Attends religious service: Not on file    Active member of club or organization:  Not on file    Attends meetings of clubs or organizations: Not on file    Relationship status: Not on file  Other Topics Concern  . Not on file  Social History Narrative  . Not on file   Family History: Family History  Problem Relation Age of Onset  . Cancer Daughter   . Alzheimer's disease Mother   . Lung cancer Father    Allergies: No Known Allergies Medications: See med rec.  Review of Systems: No fevers, chills, night sweats, weight loss, chest pain, or shortness of breath.   Objective:    General: Well Developed, well nourished, and in no acute distress.  Neuro: Alert and oriented x3, extra-ocular muscles intact, sensation grossly intact.  HEENT:  Normocephalic, atraumatic, pupils equal round reactive to light, neck supple, no masses, no lymphadenopathy, thyroid nonpalpable.  Skin: Warm and dry, his previous groin rash and scrotal rash has resolved. Cardiac: Regular rate and rhythm, no murmurs rubs or gallops, no lower extremity edema.  Respiratory: Clear to auscultation bilaterally. Not using accessory muscles, speaking in full sentences.  Impression and Recommendations:    Diabetes mellitus type 2, uncontrolled (HCC) Trulicity is getting too expensive, all GLP-1's are tier 3. I think we are just going to have to use metformin and glipizide.  I spent 25 minutes with this patient, greater than 50% was face-to-face time counseling regarding the above diagnoses.  ___________________________________________ Ihor Austinhomas J. Benjamin Stainhekkekandam, M.D., ABFM., CAQSM. Primary Care and Sports Medicine [facility]  Adjunct Professor of Family Medicine  [employer]

## 2018-10-24 NOTE — Assessment & Plan Note (Signed)
Trulicity is getting too expensive, all GLP-1's are tier 3. I think we are just going to have to use metformin and glipizide.

## 2018-11-14 ENCOUNTER — Encounter: Payer: Self-pay | Admitting: Sports Medicine

## 2018-11-26 ENCOUNTER — Encounter: Payer: Self-pay | Admitting: Sports Medicine

## 2018-12-14 ENCOUNTER — Other Ambulatory Visit: Payer: Self-pay

## 2018-12-14 ENCOUNTER — Ambulatory Visit (INDEPENDENT_AMBULATORY_CARE_PROVIDER_SITE_OTHER): Payer: Medicare PPO | Admitting: Sports Medicine

## 2018-12-14 ENCOUNTER — Encounter: Payer: Self-pay | Admitting: Sports Medicine

## 2018-12-14 DIAGNOSIS — L03032 Cellulitis of left toe: Secondary | ICD-10-CM | POA: Diagnosis not present

## 2018-12-14 MED ORDER — CIPROFLOXACIN HCL 750 MG PO TABS: 750.0000 mg | ORAL_TABLET | Freq: Two times a day (BID) | ORAL | 0 refills | Status: AC

## 2018-12-14 MED ORDER — DOXYCYCLINE HYCLATE 100 MG PO TABS: 100.0000 mg | ORAL_TABLET | Freq: Two times a day (BID) | ORAL | 0 refills | Status: AC

## 2018-12-14 MED ORDER — METRONIDAZOLE 500 MG PO TABS: 500.0000 mg | ORAL_TABLET | Freq: Two times a day (BID) | ORAL | 0 refills | Status: AC

## 2018-12-14 NOTE — Assessment & Plan Note (Signed)
Left second toe paronychia, no overt purulence so no culture obtained. 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 Return to see me if no better in a week or 2.

## 2018-12-14 NOTE — Progress Notes (Signed)
Subjective:    CC: Toe infection  HPI: This is a pleasant 83 year old male, he has had some swelling and pain in his left second toe, minimal drainage.  Moderate, persistent, localized without radiation.  I reviewed the past medical history, family history, social history, surgical history, and allergies today and no changes were needed.  Please see the problem list section below in epic for further details.  Past Medical History: Past Medical History:  Diagnosis Date  . Arthritis   . Cataract   . Hypertension    takes Amlodipine,Lisinopril,and Metoprolol daily  . Lower extremity weakness    Bilateral  . Macular degeneration, left eye   . Sleep apnea    2008.Marland KitchenMarland KitchenWEARS CPAP   Past Surgical History: Past Surgical History:  Procedure Laterality Date  . BACK SURGERY    . CARDIAC CATHETERIZATION  06-10-12  . COLONOSCOPY    . EYE SURGERY     CATARACT RIGHT EYE  . KNEE SURGERY    . LUMBAR LAMINECTOMY/DECOMPRESSION MICRODISCECTOMY N/A 11/02/2012   Procedure: LUMBAR LAMINECTOMY/DECOMPRESSION MICRODISCECTOMY 2 LEVEL;  Surgeon: Emilee Hero, MD;  Location: [facility] OR;  Service: Orthopedics;  Laterality: N/A;  Lumbar 4-5,lumbar 5-sacrum 1 decompression  . LUMBAR LAMINECTOMY/DECOMPRESSION MICRODISCECTOMY N/A 09/08/2017   Procedure: THORACIC 11-12 DECOMPRESSION;  Surgeon: Estill Bamberg, MD;  Location: [facility] OR;  Service: Orthopedics;  Laterality: N/A;  . TONSILLECTOMY    . TOTAL HIP ARTHROPLASTY Left 01/06/2013   Procedure: LEFT TOTAL HIP ARTHROPLASTY-Posterior approach;  Surgeon: Harvie Junior, MD;  Location: [facility] OR;  Service: Orthopedics;  Laterality: Left;  . TOTAL HIP ARTHROPLASTY Right 04/21/2013   Procedure: TOTAL HIP ARTHROPLASTY ANTERIOR APPROACH;  Surgeon: Harvie Junior, MD;  Location: [facility] OR;  Service: Orthopedics;  Laterality: Right;  . TOTAL KNEE ARTHROPLASTY Left 09/16/2012   Procedure: TOTAL KNEE ARTHROPLASTY;  Surgeon: Harvie Junior, MD;  Location: [facility] OR;  Service: Orthopedics;   Laterality: Left;   Social History: Social History   Socioeconomic History  . Marital status: Married    Spouse name: Not on file  . Number of children: 5  . Years of education: Not on file  . Highest education level: Not on file  Occupational History  . Not on file  Social Needs  . Financial resource strain: Not on file  . Food insecurity    Worry: Not on file    Inability: Not on file  . Transportation needs    Medical: Not on file    Non-medical: Not on file  Tobacco Use  . Smoking status: Former Smoker    Packs/day: 1.00    Years: 20.00    Pack years: 20.00    Types: Cigarettes    Quit date: 01/04/1993    Years since quitting: 25.9  . Smokeless tobacco: Former Neurosurgeon    Types: Chew    Quit date: 01/04/1993  . Tobacco comment: quit 41yrs ago  Substance and Sexual Activity  . Alcohol use: No  . Drug use: No  . Sexual activity: Not Currently    Birth control/protection: None  Lifestyle  . Physical activity    Days per week: Not on file    Minutes per session: Not on file  . Stress: Not on file  Relationships  . Social Musician on phone: Not on file    Gets together: Not on file    Attends religious service: Not on file    Active member of club or organization: Not on file  Attends meetings of clubs or organizations: Not on file    Relationship status: Not on file  Other Topics Concern  . Not on file  Social History Narrative  . Not on file   Family History: Family History  Problem Relation Age of Onset  . Cancer Daughter   . Alzheimer's disease Mother   . Lung cancer Father    Allergies: No Known Allergies Medications: See med rec.  Review of Systems: No fevers, chills, night sweats, weight loss, chest pain, or shortness of breath.   Objective:    General: Well Developed, well nourished, and in no acute distress.  Neuro: Alert and oriented x3, extra-ocular muscles intact, sensation grossly intact.  HEENT: Normocephalic, atraumatic,  pupils equal round reactive to light, neck supple, no masses, no lymphadenopathy, thyroid nonpalpable.  Skin: Warm and dry, no rashes. Cardiac: Regular rate and rhythm, no murmurs rubs or gallops, no lower extremity edema.  Respiratory: Clear to auscultation bilaterally. Not using accessory muscles, speaking in full sentences. Left foot: Erythematous lesion on the lateral nail fold of the second toe, consistent with a paronychia.  Impression and Recommendations:    Paronychia of second toe of left foot Left second toe paronychia, no overt purulence so no culture obtained. 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 Return to see me if no better in a week or 2.   ___________________________________________ Gwen Her. Dianah Field, M.D., ABFM., CAQSM. Primary Care and Sports Medicine [facility] [facility]  Adjunct Professor of [employer]

## 2018-12-14 NOTE — Patient Instructions (Signed)
Paronychia Paronychia is an infection of the skin that surrounds a nail. It usually affects the skin around a fingernail, but it may also occur near a toenail. It often causes pain and swelling around the nail. In some cases, a collection of pus (abscess) can form near or under the nail.  This condition may develop suddenly, or it may develop gradually over a longer period. In most cases, paronychia is not serious, and it will clear up with treatment. What are the causes? This condition may be caused by bacteria or a fungus. These germs can enter the body through an opening in the skin, such as a cut or a hangnail. What increases the risk? This condition is more likely to develop in people who:  Get their hands wet often, such as those who work as dishwashers, bartenders, or nurses.  Bite their fingernails or suck their thumbs.  Trim their nails very short.  Have hangnails or injured fingertips.  Get manicures.  Have diabetes. What are the signs or symptoms? Symptoms of this condition include:  Redness and swelling of the skin near the nail.  Tenderness around the nail when you touch the area.  Pus-filled bumps under the skin at the base and sides of the nail (cuticle).  Fluid or pus under the nail.  Throbbing pain in the area. How is this diagnosed? This condition is diagnosed with a physical exam. In some cases, a sample of pus may be tested to determine what type of bacteria or fungus is causing the condition. How is this treated? Treatment depends on the cause and severity of your condition. If your condition is mild, it may clear up on its own in a few days or after soaking in warm water. If needed, treatment may include:  Antibiotic medicine, if your infection is caused by bacteria.  Antifungal medicine, if your infection is caused by a fungus.  A procedure to drain pus from an abscess.  Anti-inflammatory medicine (corticosteroids). Follow these instructions at home:  Wound care  Keep the affected area clean.  Soak the affected area in warm water, if told to do so by your health care provider. You may be told to do this for 20 minutes, 2-3 times a day.  Keep the area dry when you are not soaking it.  Do not try to drain an abscess yourself.  Follow instructions from your health care provider about how to take care of the affected area. Make sure you: ? Wash your hands with soap and water before you change your bandage (dressing). If soap and water are not available, use hand sanitizer. ? Change your dressing as told by your health care provider.  If you had an abscess drained, check the area every day for signs of infection. Check for: ? Redness, swelling, or pain. ? Fluid or blood. ? Warmth. ? Pus or a bad smell. Medicines   Take over-the-counter and prescription medicines only as told by your health care provider.  If you were prescribed an antibiotic medicine, take it as told by your health care provider. Do not stop taking the antibiotic even if you start to feel better. General instructions  Avoid contact with harsh chemicals.  Do not pick at the affected area. Prevention  To prevent this condition from happening again: ? Wear rubber gloves when washing dishes or doing other tasks that require your hands to get wet. ? Wear gloves if your hands might come in contact with cleaners or other chemicals. ? Avoid   injuring your nails or fingertips. ? Do not bite your nails or tear hangnails. ? Do not cut your nails very short. ? Do not cut your cuticles. ? Use clean nail clippers or scissors when trimming nails. Contact a health care provider if:  Your symptoms get worse or do not improve with treatment.  You have continued or increased fluid, blood, or pus coming from the affected area.  Your finger or knuckle becomes swollen or difficult to move. Get help right away if you have:  A fever or chills.  Redness spreading away from  the affected area.  Joint or muscle pain. Summary  Paronychia is an infection of the skin that surrounds a nail. It often causes pain and swelling around the nail. In some cases, a collection of pus (abscess) can form near or under the nail.  This condition may be caused by bacteria or a fungus. These germs can enter the body through an opening in the skin, such as a cut or a hangnail.  If your condition is mild, it may clear up on its own in a few days. If needed, treatment may include medicine or a procedure to drain pus from an abscess.  To prevent this condition from happening again, wear gloves if doing tasks that require your hands to get wet or to come in contact with chemicals. Also avoid injuring your nails or fingertips. This information is not intended to replace advice given to you by your health care provider. Make sure you discuss any questions you have with your health care provider. Document Released: 07/15/2000 Document Revised: 02/05/2017 Document Reviewed: 02/01/2017 Elsevier Patient Education  2020 Elsevier Inc.  

## 2018-12-27 ENCOUNTER — Other Ambulatory Visit: Payer: Self-pay | Admitting: Sports Medicine

## 2018-12-27 DIAGNOSIS — F5101 Primary insomnia: Secondary | ICD-10-CM

## 2018-12-27 DIAGNOSIS — N189 Chronic kidney disease, unspecified: Secondary | ICD-10-CM

## 2018-12-27 DIAGNOSIS — E876 Hypokalemia: Secondary | ICD-10-CM

## 2019-01-23 ENCOUNTER — Ambulatory Visit: Payer: Medicare PPO | Admitting: Sports Medicine

## 2019-03-01 ENCOUNTER — Encounter: Payer: Self-pay | Admitting: Sports Medicine

## 2019-03-01 ENCOUNTER — Ambulatory Visit (INDEPENDENT_AMBULATORY_CARE_PROVIDER_SITE_OTHER): Payer: Medicare FFS | Admitting: Sports Medicine

## 2019-03-01 VITALS — BP 112/67 | HR 60 | Ht 69.0 in | Wt 299.0 lb

## 2019-03-01 DIAGNOSIS — E1165 Type 2 diabetes mellitus with hyperglycemia: Secondary | ICD-10-CM | POA: Diagnosis not present

## 2019-03-01 DIAGNOSIS — B351 Tinea unguium: Secondary | ICD-10-CM | POA: Diagnosis not present

## 2019-03-01 LAB — POCT GLYCOSYLATED HEMOGLOBIN (HGB A1C): Hemoglobin A1C: 5.2 % (ref 4.0–5.6)

## 2019-03-01 MED ORDER — TERBINAFINE HCL 250 MG PO TABS: 250.0000 mg | ORAL_TABLET | Freq: Every day | ORAL | 1 refills | Status: AC

## 2019-03-01 NOTE — Assessment & Plan Note (Signed)
Lenard Galloway returns, his diabetes is well controlled, he is up-to-date on his screening measures, return in 6 months for this.

## 2019-03-01 NOTE — Assessment & Plan Note (Signed)
For months Steven Patton has had thickened, yellow toenails, mostly on the right foot. Starting 6 months of Lamisil. If persistent discomfort we will refer him to podiatry.

## 2019-03-01 NOTE — Progress Notes (Signed)
    Procedures performed today:    None.  Independent interpretation of tests performed by another provider:   None.  Impression and Recommendations:    Diabetes mellitus type 2, uncontrolled (HCC) Steven Patton returns, his diabetes is well controlled, he is up-to-date on his screening measures, return in 6 months for this.  Onychomycosis For months Steven Patton has had thickened, yellow toenails, mostly on the right foot. Starting 6 months of Lamisil. If persistent discomfort we will refer him to podiatry.    ___________________________________________ [name]. [name], M.D., ABFM., CAQSM. Primary Care and Sports Medicine [place] [facility]  Adjunct Instructor of Family Medicine  [employer]

## 2019-03-06 ENCOUNTER — Other Ambulatory Visit: Payer: Self-pay | Admitting: Sports Medicine

## 2019-03-06 DIAGNOSIS — F5101 Primary insomnia: Secondary | ICD-10-CM

## 2019-03-09 ENCOUNTER — Other Ambulatory Visit: Payer: Self-pay | Admitting: Cardiology

## 2019-03-20 ENCOUNTER — Telehealth: Payer: Self-pay | Admitting: Sports Medicine

## 2019-03-20 NOTE — Telephone Encounter (Signed)
Steven Patton called this morning wanting to know if you could see her dad. She thinks he has a possible uti.

## 2019-03-20 NOTE — Telephone Encounter (Signed)
I think Alvina Chou, and Dr. Linford Arnold all have several openings today.

## 2019-03-20 NOTE — Telephone Encounter (Signed)
I called Steven Patton and she states he has been scheduled to see Dr [name] in the morning. I advised he may need to be evaluated today. She states she has no way to bring him in today. She states he has weakness. No other symptoms. Denies fever, urinary problems, new confusion or different mood. He is eating and drinking as normal. He is having normal bowel movements and urinating normal. I did advise she will need to call EMS if his symptoms worsens or changes.

## 2019-03-21 ENCOUNTER — Ambulatory Visit (INDEPENDENT_AMBULATORY_CARE_PROVIDER_SITE_OTHER): Payer: Medicare FFS | Admitting: Sports Medicine

## 2019-03-21 ENCOUNTER — Other Ambulatory Visit: Payer: Self-pay | Admitting: Sports Medicine

## 2019-03-21 ENCOUNTER — Encounter: Payer: Self-pay | Admitting: Sports Medicine

## 2019-03-21 ENCOUNTER — Other Ambulatory Visit: Payer: Self-pay

## 2019-03-21 DIAGNOSIS — R600 Localized edema: Secondary | ICD-10-CM

## 2019-03-21 DIAGNOSIS — I10 Essential (primary) hypertension: Secondary | ICD-10-CM

## 2019-03-21 DIAGNOSIS — R601 Generalized edema: Secondary | ICD-10-CM

## 2019-03-21 LAB — URINALYSIS W MICROSCOPIC + REFLEX CULTURE
Bacteria, UA: NONE SEEN /HPF
Bilirubin Urine: NEGATIVE
Glucose, UA: NEGATIVE
Hgb urine dipstick: NEGATIVE
Hyaline Cast: NONE SEEN /LPF
Ketones, ur: NEGATIVE
Leukocyte Esterase: NEGATIVE
Nitrites, Initial: NEGATIVE
Protein, ur: NEGATIVE
RBC / HPF: NONE SEEN /HPF (ref 0–2)
Specific Gravity, Urine: 1.012 (ref 1.001–1.03)
Squamous Epithelial / HPF: NONE SEEN /HPF (ref <=5)
WBC, UA: NONE SEEN /HPF (ref 0–5)
pH: <=5 (ref 5.0–8.0)

## 2019-03-21 LAB — COMPLETE METABOLIC PANEL WITH GFR
AG Ratio: 1.6 (calc) (ref 1.0–2.5)
ALT: 11 U/L (ref 9–46)
AST: 15 U/L (ref 10–35)
Albumin: 4.6 g/dL (ref 3.6–5.1)
Alkaline phosphatase (APISO): 42 U/L (ref 35–144)
BUN/Creatinine Ratio: 22 (calc) (ref 6–22)
BUN: 37 mg/dL — ABNORMAL HIGH (ref 7–25)
CO2: 27 mmol/L (ref 20–32)
Calcium: 9.8 mg/dL (ref 8.6–10.3)
Chloride: 96 mmol/L — ABNORMAL LOW (ref 98–110)
Creat: 1.72 mg/dL — ABNORMAL HIGH (ref 0.70–1.11)
GFR, Est African American: 42 mL/min/{1.73_m2} — ABNORMAL LOW (ref >=60)
GFR, Est Non African American: 36 mL/min/{1.73_m2} — ABNORMAL LOW (ref >=60)
Globulin: 2.9 g/dL (calc) (ref 1.9–3.7)
Glucose, Bld: 121 mg/dL — ABNORMAL HIGH (ref 65–99)
Potassium: 3.7 mmol/L (ref 3.5–5.3)
Sodium: 139 mmol/L (ref 135–146)
Total Bilirubin: 0.6 mg/dL (ref 0.2–1.2)
Total Protein: 7.5 g/dL (ref 6.1–8.1)

## 2019-03-21 LAB — TROPONIN I: Troponin I: 0.01 ng/mL (ref <=0.0)

## 2019-03-21 LAB — CBC
HCT: 44.4 % (ref 38.5–50.0)
Hemoglobin: 14.8 g/dL (ref 13.2–17.1)
MCH: 31.1 pg (ref 27.0–33.0)
MCHC: 33.3 g/dL (ref 32.0–36.0)
MCV: 93.3 fL (ref 80.0–100.0)
MPV: 10.1 fL (ref 7.5–12.5)
Platelets: 159 10*3/uL (ref 140–400)
RBC: 4.76 10*6/uL (ref 4.20–5.80)
RDW: 14.4 % (ref 11.0–15.0)
WBC: 13.8 10*3/uL — ABNORMAL HIGH (ref 3.8–10.8)

## 2019-03-21 LAB — CK TOTAL AND CKMB (NOT AT ARMC)
CK, MB: 1.5 ng/mL (ref 0–5.0)
Relative Index: 2.5 (ref 0–4.0)
Total CK: 61 U/L (ref 44–196)

## 2019-03-21 LAB — NO CULTURE INDICATED

## 2019-03-21 MED ORDER — CARVEDILOL 12.5 MG PO TABS: 12.5000 mg | ORAL_TABLET | Freq: Two times a day (BID) | ORAL | 3 refills | Status: DC

## 2019-03-21 MED ORDER — TORSEMIDE 10 MG PO TABS: 10.0000 mg | ORAL_TABLET | Freq: Every day | ORAL | 3 refills | Status: DC

## 2019-03-21 NOTE — Assessment & Plan Note (Signed)
Steven Patton returns, we have been treating him aggressively for hypertension, his blood pressures have come down nicely, he is on multiple antihypertensive. Over the past several days he has developed significant weakness, orthostasis. His blood pressures are relatively low and he is about 15 pounds down compared to the end of last year. I think we are simply overtreating him, he will hold his torsemide for a week and then restart it only 10 mg daily. He is a bit bradycardic as well so we are going to decrease his carvedilol from 25 mg twice daily to 12.5 twice a day. I am going to get some labs including CBC, CMP, urinalysis, cardiac enzymes. Return to see me next week, we will run some IV fluids if not feeling significantly better.

## 2019-03-21 NOTE — Progress Notes (Signed)
    Procedures performed today:    None.  Independent interpretation of tests performed by another provider:   None.  Impression and Recommendations:    Hypertension [name] returns, we have been treating him aggressively for hypertension, his blood pressures have come down nicely, he is on multiple antihypertensive. Over the past several days he has developed significant weakness, orthostasis. His blood pressures are relatively low and he is about 15 pounds down compared to the end of last year. I think we are simply overtreating him, he will hold his torsemide for a week and then restart it only 10 mg daily. He is a bit bradycardic as well so we are going to decrease his carvedilol from 25 mg twice daily to 12.5 twice a day. I am going to get some labs including CBC, CMP, urinalysis, cardiac enzymes. Return to see me next week, we will run some IV fluids if not feeling significantly better.    ___________________________________________ [name]. [name], M.D., ABFM., CAQSM. Primary Care and Sports Medicine [place] [facility]  Adjunct Instructor of Family Medicine  [employer]

## 2019-03-22 ENCOUNTER — Telehealth: Payer: Self-pay

## 2019-03-22 NOTE — Telephone Encounter (Signed)
Shanda Bumps with [employer] called and states she doesn't see a reason for IV fluids. She reports his urine is clear, he is drinking fluids and eating and orthostatics normal. She would not provide a call back number.

## 2019-03-22 NOTE — Telephone Encounter (Signed)
Steven Patton states [employer] has a Scientific laboratory technician. A nurse has been scheduled to come to the house and evaluated him and give him fluids if needed. I advised her that Dr [name] wanted him to come in. She states the nurse should be there within two hours and she would rather not have to transport him.

## 2019-03-22 NOTE — Telephone Encounter (Signed)
I do not think that he needs EMS, as this does not sound life-threatening, I think we can just see him at 1:15 and run some IV fluids.

## 2019-03-22 NOTE — Telephone Encounter (Signed)
Called [facility] at [phone] and they are having Shanda Bumps call my direct number to speak with Dr Karie Schwalbe

## 2019-03-22 NOTE — Telephone Encounter (Signed)
No problem, my recommendation would be 2 L of lactated Ringer's at home then.  If she only has normal saline then that is fine as well.

## 2019-03-22 NOTE — Telephone Encounter (Signed)
[name] called and states his dad's weakness is worse today. He also had diarrhea last night. I advised her to call EMS now.

## 2019-03-22 NOTE — Telephone Encounter (Signed)
Advised daughter for the nurse to call the office for Dr Lucienne Minks recommendations.

## 2019-03-22 NOTE — Telephone Encounter (Signed)
He was mildly hypotensive, symptomatically weak, he has an elevated BUN/creatinine ratio, treatment with diuretics can cause clear urine in a dehydrated patient, nurses should not be making medical decisions like this.  She is incorrect.

## 2019-04-04 ENCOUNTER — Other Ambulatory Visit: Payer: Self-pay

## 2019-04-04 ENCOUNTER — Encounter: Payer: Self-pay | Admitting: Sports Medicine

## 2019-04-04 ENCOUNTER — Ambulatory Visit (INDEPENDENT_AMBULATORY_CARE_PROVIDER_SITE_OTHER): Payer: Medicare FFS | Admitting: Sports Medicine

## 2019-04-04 DIAGNOSIS — Z87898 Personal history of other specified conditions: Secondary | ICD-10-CM

## 2019-04-04 DIAGNOSIS — E1165 Type 2 diabetes mellitus with hyperglycemia: Secondary | ICD-10-CM

## 2019-04-04 DIAGNOSIS — E1169 Type 2 diabetes mellitus with other specified complication: Secondary | ICD-10-CM

## 2019-04-04 DIAGNOSIS — I10 Essential (primary) hypertension: Secondary | ICD-10-CM | POA: Diagnosis not present

## 2019-04-04 DIAGNOSIS — E669 Obesity, unspecified: Secondary | ICD-10-CM

## 2019-04-04 DIAGNOSIS — D72829 Elevated white blood cell count, unspecified: Secondary | ICD-10-CM

## 2019-04-04 DIAGNOSIS — E119 Type 2 diabetes mellitus without complications: Secondary | ICD-10-CM

## 2019-04-04 MED ORDER — CARVEDILOL 3.125 MG PO TABS: 3.1250 mg | ORAL_TABLET | Freq: Two times a day (BID) | ORAL | 3 refills | Status: DC

## 2019-04-04 NOTE — Assessment & Plan Note (Addendum)
Steven Patton returns, he does have significant progressive weakness, initially suspected to be related to dehydration at the last visit, we cut back his diuretics. Today's weakness is more evident in his lower extremities. He did have some prerenal azotemia and leukocytosis, rechecking CBC, CMP today. He also has some bradycardia, at the last visit we decreased his carvedilol, I am going to decrease it further today down to 3.125 mg. In addition he has a history of thoracic spondylitic myelopathy post decompressive surgery with Dr. Yevette Edwards, it is known that strength does not always recover completely after decompression for myelopathy. Lastly I would like to get a second opinion from neurology to evaluate for a primary myopathy or motor neuron disease, referral to neurology. I am going to go and pull the trigger for myasthenia gravis panel as well. If the above evaluation is unrevealing we will simply have to chalk this up on weakness of aging, he does continue to maintain mobility with his motorized wheelchair. He also has help with home health nursing.

## 2019-04-04 NOTE — Progress Notes (Addendum)
    Procedures performed today:    None.  Independent interpretation of notes and tests performed by another provider:   None.  Impression and Recommendations:    History of progressive weakness Steven Patton returns, he does have significant progressive weakness, initially suspected to be related to dehydration at the last visit, we cut back his diuretics. Today's weakness is more evident in his lower extremities. He did have some prerenal azotemia and leukocytosis, rechecking CBC, CMP today. He also has some bradycardia, at the last visit we decreased his carvedilol, I am going to decrease it further today down to 3.125 mg. In addition he has a history of thoracic spondylitic myelopathy post decompressive surgery with Dr. Yevette Edwards, it is known that strength does not always recover completely after decompression for myelopathy. Lastly I would like to get a second opinion from neurology to evaluate for a primary myopathy or motor neuron disease, referral to neurology. I am going to go and pull the trigger for myasthenia gravis panel as well. If the above evaluation is unrevealing we will simply have to chalk this up on weakness of aging, he does continue to maintain mobility with his motorized wheelchair. He also has help with home health nursing.  Diabetes mellitus type 2 in obese (HCC) Diabetes is historically well controlled. Hemoglobin A1c was 5.2 earlier this year. Steven Patton's blood sugar is very low on his labs today, I am going to decrease his glipizide to 5 mg twice daily rather than 10, when he is noted to be very weak in the mornings I would like his family to check a fingerstick blood sugar. This would help determine whether his weakness is due to his current hypoglycemia.  Leukocytosis With persistent elevated WBC count and results of the pathologist interpretation of the peripheral smear I am also going to pull the trigger for a referral to hematology  oncology.    ___________________________________________ [name]. [name], M.D., ABFM., CAQSM. Primary Care and Sports Medicine [place] [facility]  Adjunct Instructor of Family Medicine  [employer]

## 2019-04-05 MED ORDER — GLIPIZIDE 5 MG PO TABS: 5.0000 mg | ORAL_TABLET | Freq: Two times a day (BID) | ORAL | 3 refills | Status: DC

## 2019-04-05 NOTE — Addendum Note (Signed)
Addended by: Monica Becton on: 04/05/2019 12:04 PM   Modules accepted: Orders

## 2019-04-05 NOTE — Assessment & Plan Note (Signed)
Diabetes is historically well controlled. Hemoglobin A1c was 5.2 earlier this year. Steven Patton's blood sugar is very low on his labs today, I am going to decrease his glipizide to 5 mg twice daily rather than 10, when he is noted to be very weak in the mornings I would like his family to check a fingerstick blood sugar. This would help determine whether his weakness is due to his current hypoglycemia.

## 2019-04-05 NOTE — Assessment & Plan Note (Signed)
With persistent elevated WBC count and results of the pathologist interpretation of the peripheral smear I am also going to pull the trigger for a referral to hematology oncology.

## 2019-04-05 NOTE — Addendum Note (Signed)
Addended by: Monica Becton on: 04/05/2019 09:08 AM   Modules accepted: Orders

## 2019-04-06 ENCOUNTER — Ambulatory Visit: Payer: Medicare FFS | Admitting: Neurology

## 2019-04-06 ENCOUNTER — Other Ambulatory Visit: Payer: Self-pay

## 2019-04-06 ENCOUNTER — Encounter: Payer: Self-pay | Admitting: Neurology

## 2019-04-06 VITALS — BP 132/68 | HR 68 | Temp 98.0°F | Ht 69.0 in | Wt 297.0 lb

## 2019-04-06 DIAGNOSIS — M545 Low back pain, unspecified: Secondary | ICD-10-CM | POA: Insufficient documentation

## 2019-04-06 DIAGNOSIS — M48062 Spinal stenosis, lumbar region with neurogenic claudication: Secondary | ICD-10-CM | POA: Diagnosis not present

## 2019-04-06 DIAGNOSIS — R262 Difficulty in walking, not elsewhere classified: Secondary | ICD-10-CM | POA: Diagnosis not present

## 2019-04-06 DIAGNOSIS — E119 Type 2 diabetes mellitus without complications: Secondary | ICD-10-CM | POA: Insufficient documentation

## 2019-04-06 DIAGNOSIS — E1139 Type 2 diabetes mellitus with other diabetic ophthalmic complication: Secondary | ICD-10-CM

## 2019-04-06 DIAGNOSIS — M5412 Radiculopathy, cervical region: Secondary | ICD-10-CM | POA: Insufficient documentation

## 2019-04-06 DIAGNOSIS — R253 Fasciculation: Secondary | ICD-10-CM | POA: Insufficient documentation

## 2019-04-06 DIAGNOSIS — M4712 Other spondylosis with myelopathy, cervical region: Secondary | ICD-10-CM | POA: Insufficient documentation

## 2019-04-06 DIAGNOSIS — N1831 Chronic kidney disease, stage 3a: Secondary | ICD-10-CM | POA: Insufficient documentation

## 2019-04-06 DIAGNOSIS — R200 Anesthesia of skin: Secondary | ICD-10-CM | POA: Insufficient documentation

## 2019-04-06 NOTE — Progress Notes (Addendum)
Provider:  Larey Seat, M D  Referring Provider: Silverio Decamp Primary Care Physician:  Silverio Decamp, MD  Chief Complaint  Patient presents with  . New Patient (Initial Visit)    pt with grandson, who is also caretaker. pt has developed increased in weakness. pcp wanted to eval evaluate for a primary myopathy or motor neuron disease.   . Other    grandson states that sometimes its like he is unable to control and hold his top half up and falls over.     HPI:  Steven Patton is a caucasian  84 y.o. male patient and seen here with his grandson, [name],  as a referral  from Dr. Dianah Field for evalaution of muscle weakness.   The patient is status post sinal surgery times 4 and neck surgery as well.  The weakness has progressed over 5 weeks, and he has good and bad days. He needs hi son and grandson to help him.   Dr Darene Lamer. Ordered several labs but some are pending, he saw him on 04-04-2019 , just Tuesday, 2 days ago.    Review of the patients past medical history:    has a past medical history of Arthritis, Cataract, Hypertension, Lower extremity weakness, Macular degeneration, left eye, and Sleep apnea..  Obviously this is not his whole history: Degenerative disc disease, leading to 4 back surgeries and one neck surgeries with anterior fusion, neck surgery in 2018.  Complete total knee replacement on the left in the year 2018, bilateral hip replacements, same year. He had 4 major surgeries in 7 months in 2018.  He suffered a septic septic shock in in 2018 or 19.  In 2020 he had significant elevated hemoglobin A1c, it was a clear diagnosis of hyperlipidemia hypercholesterolemia made, he had abnormal complete blood cell counts.  He also has a history of renal dysfunction CKD grade 3, recently her diuretics have been reduced helping him to improve his renal clearance.  Glipizide had to be reduced as he suffered some hypoglycemia in between.  He had suffered a  spinal cord compression with myelopathy at the cervical level before this was surgically somewhat improved.  He increased in strength and ability to walk after surgery but now seems to gradually decline again.    Family medical  history: father died of lung cancer, mother had leukemia. Alzheimer's   Social history: Patient is retired from [employer] in 2005 and lives in a household with 5 persons. His spouse, son, daughter in law and grandson. The patient currently is non- ambulatory.  Tobacco use: remote , quit 25 years ago.  ETOH use ; none, Caffeine intake in form of Coffee( none) Soda( none Tea ( noe) or energy drinks.     Physical exam:  Today's Vitals   04/06/19 1515  BP: 132/68  Pulse: 68  Temp: 98 F (36.7 C)  Weight: 297 lb (134.7 kg)  Height: 5' 9" (1.753 m)   Body mass index is 43.86 kg/m.   Wt Readings from Last 3 Encounters:  04/06/19 297 lb (134.7 kg)  03/21/19 297 lb (134.7 kg)  03/01/19 299 lb (135.6 kg)     Ht Readings from Last 3 Encounters:  04/06/19 5' 9" (1.753 m)  03/01/19 5' 9" (1.753 m)  10/24/18 5' 9" (1.753 m)      General: The patient is awake, alert and appears not in acute distress. The patient is well groomed. Head: Normocephalic, atraumatic. Neck is supple.  Cardiovascular:  Regular rate and cardiac rhythm by pulse, without distended neck veins. Respiratory: Lungs are clear to auscultation.  Skin:  leg edema, pitting -  Trunk: The patient's seated posture is erect.   Neurologic exam : The patient is awake and alert, oriented to place and time.   Memory subjective described as intact.  Attention span & concentration ability appears normal.  Speech is fluent,  without  dysarthria, but dysphonia. No dysphagia. .  Mood and affect are appropriate.   Cranial nerves: no loss of smell or taste reported  Pupils are equal and briskly reactive to light. Funduscopic exam deferred. .  Extraocular movements in vertical and horizontal planes  were intact and without nystagmus.  He had low vision in the left eye. He has a distorted vision in the right eye. Retinal detachment.  No Diplopia. Visual fields by finger perimetry are left side- restricted.  Hearing was intact in the left ear to soft voice and finger rubbing.    Facial sensation intact to fine touch.  Facial motor strength is symmetric and tongue and uvula move midline. Tongue is trembling. Fasciculation?  Neck ROM : rotation, tilt and flexion extension were normal for age and shoulder shrug was symmetrical.    Motor exam:  Symmetric bulk, low tone, no rigor. Visible fasciculations.   symmetrically weak grip strength .   Sensory:  Fine touch, pinprick and vibration were tested  And cannot be felt on either lower extremity.  Proprioception tested in the upper extremities was impaired, pronator drift noted    Coordination: alternating movements in the fingers/hands were slowed.  The Finger-to-nose maneuver was slowed and unsteady with evidence of dysmetria and action tremor.  Gait and station: Patient could not rise  Deep tendon reflexes: in the  upper and lower extremities are atenutaed, I obtained a biceps on the right, triceps on the left ( trace ) and patella on the right TRACE only . Babinski response was deferred.      Review of outside test results, the most recent kidney function test shows a slight improvement over the last 6 months, glucose was low at 63 and therefore glipizide was reduced, creatinine was 1.27 and has been as high as 1.5.  White blood cell count was 12.5 elevated, neutrophils 8.3 to 5/mcL this is elevated monocytes 1113 elevated, eosinophils elevated.  This seems to be that all subtypes of white blood cells have been higher indicating either an infection or chronic inflammation.  Glomerular filtration rate currently is estimated to be 52 and is better than it used to be.  No liver function test abnormalities CO2 is intact albumin level is normal.   We are still waiting for a myasthenia panel that his primary care physician ordered.  What worries me is that he may have leukocytosis due to a bone marrow abnormality, there is also a longstanding history of diabetes mellitus but I do not think that neuropathy of the legs could ever explain his degree of fluid retention nor his ability to lift up the hip or stand.  The history of rather rapid progressive weakness has to be seen in context in this case as the patient has not walked unassisted since his back surgery or neck surgery in 2018.  Spondylotic myelopathy past decompressive surgery is unlikely to recover full strength.  I would do however want to make sure that there is no progression of the myelopathy.  I do wonder also about a motor neuron disease having seen fasciculations on  both upper extremities.  I will order a nerve conduction EMG for him.  I like him to use his motorized wheelchair for most mobility.  Dr. Lynann Bologna did his back surgery has been to the patient's knowledge no follow-up MRI since he had his neck surgery and I will order one.   After spending a total time of 55  minutes face to face and with time for physical and neurologic examination, review of laboratory studies,  personal review of imaging studies, reports and results of other testing and review of referral information / records as far as provided in visit, I have established the following ::  My Plan is to proceed with: 1)  Myelopathy cervical, surgical corection may not have yielded more stability or function. He has now fasciculations, we need MRI neck, EMG and NCV to rule out ALS, and PCP arranged visit with hematology for persitent white blood cell elevation.    I would like to thank Silverio Decamp, MD  for allowing me to meet with and to take care of this pleasant patient.   In short, Steven Patton is presenting with baseline gait and strength loss since 2018 bit a rapid additional decline from his low  baseline since January. , a symptom that can be attributed to myelopathy and / or ALS.    I plan to follow up either personally or through our NP within 2 month.   CC: I will share my notes with PCP .  Electronically signed by: Larey Seat, MD 04/06/2019 3:30 PM  [facility] [place] certified by The [employer] of Sleep Medicine and Diplomate of the [employer] of Sleep Medicine. Board certified In Neurology through the [employer], Fellow of the [employer] of Neurology. Medical Director of [employer].     Social History   Socioeconomic History  . Marital status: Married    Spouse name: Not on file  . Number of children: 5  . Years of education: Not on file  . Highest education level: Not on file  Occupational History  . Not on file  Tobacco Use  . Smoking status: Former Smoker    Packs/day: 1.00    Years: 20.00    Pack years: 20.00    Types: Cigarettes    Quit date: 01/04/1993    Years since quitting: 26.2  . Smokeless tobacco: Former Systems developer    Types: Chew    Quit date: 01/04/1993  . Tobacco comment: quit 30yr ago  Substance and Sexual Activity  . Alcohol use: No  . Drug use: No  . Sexual activity: Not Currently    Birth control/protection: None  Other Topics Concern  . Not on file  Social History Narrative  . Not on file   Social Determinants of Health   Financial Resource Strain:   . Difficulty of Paying Living Expenses: Not on file  Food Insecurity:   . Worried About RCharity fundraiserin the Last Year: Not on file  . Ran Out of Food in the Last Year: Not on file  Transportation Needs:   . Lack of Transportation (Medical): Not on file  . Lack of Transportation (Non-Medical): Not on file  Physical Activity:   . Days of Exercise per Week: Not on file  . Minutes of Exercise per Session: Not on file  Stress:   . Feeling of Stress : Not on file  Social Connections:   . Frequency of Communication with  Friends and Family: Not on  file  . Frequency of Social Gatherings with Friends and Family: Not on file  . Attends Religious Services: Not on file  . Active Member of Clubs or Organizations: Not on file  . Attends Archivist Meetings: Not on file  . Marital Status: Not on file  Intimate Partner Violence:   . Fear of Current or Ex-Partner: Not on file  . Emotionally Abused: Not on file  . Physically Abused: Not on file  . Sexually Abused: Not on file    Family History  Problem Relation Age of Onset  . Cancer Daughter   . Alzheimer's disease Mother   . Lung cancer Father     Past Medical History:  Diagnosis Date  . Arthritis   . Cataract   . Hypertension    takes Amlodipine,Lisinopril,and Metoprolol daily  . Lower extremity weakness    Bilateral  . Macular degeneration, left eye   . Sleep apnea    2008.Marland KitchenMarland KitchenWEARS CPAP    Past Surgical History:  Procedure Laterality Date  . BACK SURGERY    . CARDIAC CATHETERIZATION  06-10-12  . COLONOSCOPY    . EYE SURGERY     CATARACT RIGHT EYE  . KNEE SURGERY    . LUMBAR LAMINECTOMY/DECOMPRESSION MICRODISCECTOMY N/A 11/02/2012   Procedure: LUMBAR LAMINECTOMY/DECOMPRESSION MICRODISCECTOMY 2 LEVEL;  Surgeon: Sinclair Ship, MD;  Location: [place];  Service: Orthopedics;  Laterality: N/A;  Lumbar 4-5,lumbar 5-sacrum 1 decompression  . LUMBAR LAMINECTOMY/DECOMPRESSION MICRODISCECTOMY N/A 09/08/2017   Procedure: THORACIC 11-12 DECOMPRESSION;  Surgeon: Phylliss Bob, MD;  Location: [place];  Service: Orthopedics;  Laterality: N/A;  . TONSILLECTOMY    . TOTAL HIP ARTHROPLASTY Left 01/06/2013   Procedure: LEFT TOTAL HIP ARTHROPLASTY-Posterior approach;  Surgeon: Alta Corning, MD;  Location: [place];  Service: Orthopedics;  Laterality: Left;  . TOTAL HIP ARTHROPLASTY Right 04/21/2013   Procedure: TOTAL HIP ARTHROPLASTY ANTERIOR APPROACH;  Surgeon: Alta Corning, MD;  Location: [place];  Service: Orthopedics;  Laterality: Right;  . TOTAL KNEE  ARTHROPLASTY Left 09/16/2012   Procedure: TOTAL KNEE ARTHROPLASTY;  Surgeon: Alta Corning, MD;  Location: [place];  Service: Orthopedics;  Laterality: Left;    Current Outpatient Medications  Medication Sig Dispense Refill  . allopurinol (ZYLOPRIM) 300 MG tablet Take 1 tablet (300 mg total) by mouth daily. 180 tablet 1  . AMBULATORY NON FORMULARY MEDICATION Knee-high, firm compression, custom made, graduated compression stockings. Apply to lower extremities. 1 each prn  . amLODipine (NORVASC) 10 MG tablet TAKE 1 TABLET EVERY DAY 90 tablet 3  . atorvastatin (LIPITOR) 40 MG tablet TAKE 1 TABLET EVERY DAY  AT  6PM 90 tablet 3  . blood glucose meter kit and supplies KIT Dispense based on patient and insurance preference. Use up to four times daily as directed. (FOR ICD-9 250.00, 250.01). 1 each 0  . carvedilol (COREG) 3.125 MG tablet Take 1 tablet (3.125 mg total) by mouth 2 (two) times daily with a meal. 60 tablet 3  . clotrimazole-betamethasone (LOTRISONE) cream Apply 1 application topically 2 (two) times daily. 45 g 0  . gabapentin (NEURONTIN) 600 MG tablet TAKE 1 TABLET THREE TIMES DAILY 270 tablet 3  . glipiZIDE (GLUCOTROL) 5 MG tablet Take 1 tablet (5 mg total) by mouth 2 (two) times daily before a meal. 180 tablet 3  . hydrALAZINE (APRESOLINE) 25 MG tablet Take 1 tablet (25 mg total) by mouth 2 (two) times daily. APPOINTMENT NEEDED 60 tablet 0  . HYDROcodone-acetaminophen (NORCO/VICODIN) 5-325 MG  tablet Take 1 tablet by mouth every 8 (eight) hours as needed for moderate pain. 15 tablet 0  . meloxicam (MOBIC) 15 MG tablet TAKE 1 TABLET EVERY DAY 90 tablet 3  . metFORMIN (GLUCOPHAGE) 1000 MG tablet Take 1 tablet (1,000 mg total) by mouth 2 (two) times daily with a meal. 180 tablet 3  . metolazone (ZAROXOLYN) 5 MG tablet TAKE 1 TABLET EVERY DAY 90 tablet 1  . nitroGLYCERIN (NITROSTAT) 0.4 MG SL tablet Place 1 tablet (0.4 mg total) under the tongue every 5 (five) minutes as needed for chest pain  (or tightness). 30 tablet 0  . Potassium Chloride ER 20 MEQ TBCR Take 20 mEq by mouth daily.  3  . spironolactone (ALDACTONE) 25 MG tablet TAKE 1 TABLET EVERY DAY 90 tablet 1  . terbinafine (LAMISIL) 250 MG tablet Take 1 tablet (250 mg total) by mouth daily. 90 tablet 1  . torsemide (DEMADEX) 10 MG tablet Take 1 tablet (10 mg total) by mouth daily. 30 tablet 3  . traZODone (DESYREL) 100 MG tablet TAKE 1 TABLET AT BEDTIME 90 tablet 0   No current facility-administered medications for this visit.    Allergies as of 04/06/2019  . (No Known Allergies)    Vitals: BP 132/68   Pulse 68   Temp 98 F (36.7 C)   Ht 5' 9" (1.753 m)   Wt 297 lb (134.7 kg) Comment: weight 3 weeks ago  BMI 43.86 kg/m  Last Weight:  Wt Readings from Last 1 Encounters:  04/06/19 297 lb (134.7 kg)   Last Height:   Ht Readings from Last 1 Encounters:  04/06/19 5' 9" (1.753 m)      Asencion Partridge Zamiah Tollett MD 04/06/2019

## 2019-04-10 LAB — COMPLETE METABOLIC PANEL WITH GFR
AG Ratio: 1.7 (calc) (ref 1.0–2.5)
ALT: 15 U/L (ref 9–46)
AST: 16 U/L (ref 10–35)
Albumin: 4.5 g/dL (ref 3.6–5.1)
Alkaline phosphatase (APISO): 39 U/L (ref 35–144)
BUN/Creatinine Ratio: 14 (calc) (ref 6–22)
BUN: 18 mg/dL (ref 7–25)
CO2: 25 mmol/L (ref 20–32)
Calcium: 9.7 mg/dL (ref 8.6–10.3)
Chloride: 103 mmol/L (ref 98–110)
Creat: 1.27 mg/dL — ABNORMAL HIGH (ref 0.70–1.11)
GFR, Est African American: 61 mL/min/{1.73_m2} (ref >=60)
GFR, Est Non African American: 52 mL/min/{1.73_m2} — ABNORMAL LOW (ref >=60)
Globulin: 2.7 g/dL (calc) (ref 1.9–3.7)
Glucose, Bld: 63 mg/dL — ABNORMAL LOW (ref 65–99)
Potassium: 4.3 mmol/L (ref 3.5–5.3)
Sodium: 141 mmol/L (ref 135–146)
Total Bilirubin: 0.7 mg/dL (ref 0.2–1.2)
Total Protein: 7.2 g/dL (ref 6.1–8.1)

## 2019-04-10 LAB — CBC WITH DIFFERENTIAL/PLATELET
Absolute Monocytes: 1113 cells/uL — ABNORMAL HIGH (ref 200–950)
Basophils Absolute: 113 cells/uL (ref 0–200)
Basophils Relative: 0.9 %
Eosinophils Absolute: 625 cells/uL — ABNORMAL HIGH (ref 15–500)
Eosinophils Relative: 5 %
HCT: 43.2 % (ref 38.5–50.0)
Hemoglobin: 14.6 g/dL (ref 13.2–17.1)
Lymphs Abs: 2325 cells/uL (ref 850–3900)
MCH: 31.8 pg (ref 27.0–33.0)
MCHC: 33.8 g/dL (ref 32.0–36.0)
MCV: 94.1 fL (ref 80.0–100.0)
MPV: 10.2 fL (ref 7.5–12.5)
Monocytes Relative: 8.9 %
Neutro Abs: 8325 cells/uL — ABNORMAL HIGH (ref 1500–7800)
Neutrophils Relative %: 66.6 %
Platelets: 141 10*3/uL (ref 140–400)
RBC: 4.59 10*6/uL (ref 4.20–5.80)
RDW: 14.7 % (ref 11.0–15.0)
Total Lymphocyte: 18.6 %
WBC: 12.5 10*3/uL — ABNORMAL HIGH (ref 3.8–10.8)

## 2019-04-10 LAB — PATHOLOGIST SMEAR REVIEW

## 2019-04-10 LAB — MYASTHENIA GRAVIS PANEL 1
A CHR BINDING ABS: <0.3 nmol/L
STRIATED MUSCLE AB SCREEN: NEGATIVE

## 2019-04-12 ENCOUNTER — Other Ambulatory Visit: Payer: Self-pay | Admitting: Cardiology

## 2019-04-12 ENCOUNTER — Other Ambulatory Visit: Payer: Self-pay | Admitting: Sports Medicine

## 2019-04-18 ENCOUNTER — Ambulatory Visit (INDEPENDENT_AMBULATORY_CARE_PROVIDER_SITE_OTHER): Payer: Medicare FFS | Admitting: Sports Medicine

## 2019-04-18 ENCOUNTER — Encounter: Payer: Self-pay | Admitting: Sports Medicine

## 2019-04-18 DIAGNOSIS — E1169 Type 2 diabetes mellitus with other specified complication: Secondary | ICD-10-CM | POA: Diagnosis not present

## 2019-04-18 DIAGNOSIS — Z87898 Personal history of other specified conditions: Secondary | ICD-10-CM | POA: Diagnosis not present

## 2019-04-18 DIAGNOSIS — E669 Obesity, unspecified: Secondary | ICD-10-CM

## 2019-04-18 NOTE — Assessment & Plan Note (Signed)
Steven Patton returns, he was having significant progressive weakness at the last visit, initially suspected to be dehydration. We cut back his diuretics, at the last visit we also decreased his carvedilol, and decreased his glipizide. Blood sugars continue to be well controlled, 80s to 105, for this reason we are going to discontinue his glipizide altogether. He has improved considerably, his grandson told me today that they frequently see him walking across the room with his walker without much difficulty. Continue his current minimum dose carvedilol, low-dose torsemide, they will keep their appointment with neurology for consideration of nerve conduction and EMG, it sounds as though he did have some fasciculations. Myasthenia gravis panel was negative. CKs were normal indicating no evidence of a primary myopathy. We will also encourage him with his mobility. He will also continue with home health nursing.

## 2019-04-18 NOTE — Assessment & Plan Note (Signed)
Blood sugars have been running 80s to 105 on current medications, we can discontinue glipizide altogether. We will recheck A1c in about 3 months.

## 2019-04-18 NOTE — Progress Notes (Signed)
    Procedures performed today:    None.  Independent interpretation of notes and tests performed by another provider:   I reviewed notes from Dr. Vickey Huger, I also reviewed his labs including his negative myasthenia gravis panel, visit was conducted with his grandson as an additional independent historian.  Impression and Recommendations:    History of progressive weakness Steven Patton returns, he was having significant progressive weakness at the last visit, initially suspected to be dehydration. We cut back his diuretics, at the last visit we also decreased his carvedilol, and decreased his glipizide. Blood sugars continue to be well controlled, 80s to 105, for this reason we are going to discontinue his glipizide altogether. He has improved considerably, his grandson told me today that they frequently see him walking across the room with his walker without much difficulty. Continue his current minimum dose carvedilol, low-dose torsemide, they will keep their appointment with neurology for consideration of nerve conduction and EMG, it sounds as though he did have some fasciculations. Myasthenia gravis panel was negative. CKs were normal indicating no evidence of a primary myopathy. We will also encourage him with his mobility. He will also continue with home health nursing.  Diabetes mellitus type 2 in obese (HCC) Blood sugars have been running 80s to 105 on current medications, we can discontinue glipizide altogether. We will recheck A1c in about 3 months.    ___________________________________________ [name]. [name], M.D., ABFM., CAQSM. Primary Care and Sports Medicine [facility]  Adjunct Instructor of Family Medicine  [employer]

## 2019-04-21 ENCOUNTER — Other Ambulatory Visit: Payer: Self-pay | Admitting: Hematology

## 2019-04-21 ENCOUNTER — Inpatient Hospital Stay: Payer: Medicare FFS

## 2019-04-21 ENCOUNTER — Inpatient Hospital Stay: Payer: Medicare FFS | Admitting: Hematology

## 2019-04-21 DIAGNOSIS — D72829 Elevated white blood cell count, unspecified: Secondary | ICD-10-CM

## 2019-04-21 NOTE — Progress Notes (Signed)
[place] CONSULT NOTE  Patient Care Team: Silverio Decamp, MD as PCP - General (Family Medicine) Stanford Breed Denice Bors, MD as PCP - Cardiology (Cardiology)  HEME/ONC OVERVIEW: 1. Chronic, intermittent leukocytosis -WBC fluctuates between normal and up to 13k since 2017; intermittent mild thrombocytopenia   ASSESSMENT & PLAN:    Chronic, intermittent leukocytosis -I reviewed the patient's records in detail, including PCP clinic notes and lab studies dating back to 2017 -In summary, patient has had mild, intermittent leukocytosis up to 13k with normal differential since 2017.   -I reviewed the lab results in detail with the patient  -WBC 10.4k today, improving; differential normal  -I personally reviewed the patient's peripheral blood smear today.  The red blood cells were of normal morphology.  There was no schistocytosis.  The white blood cells were of mostly normal morphology except occasional atypical lymphocytes with smudged appearance, which can be an artifact of preparing a smear. There were no peripheral circulating blasts. The platelets were of normal size and I verified that there were no platelet clumping. -I discussed with the patient some of the common causes of leukocytosis, including but not limited to, infection/inflammation, medications (such as steroid), autoimmune disorders, and less commonly, rheumatologic disorders -I have ordered baseline studies, including HIV, Hep B/C serologies.  In addition, I have ordered CRP to ESR. -In addition, I have ordered peripheral blood smear to rule out any abnormal lymphocyte population  -As the patient has had chronic intermittent leukocytosis since 2017, primary hematologic disorder is less likely -I counseled the patient on some of the concerning symptoms, including but not limited to, persistent fever (T-max > 100.4), unexplained weight loss, drenching night sweats, or progressive lymphadenopathy, for which he is  instructed to seek further evaluation -If leukocytosis persists, we can order BCR/ABL to rule out CML   Thrombocytopenia -Chronic intermittent thrombocytopenia since 2017 -Plts 124k today, stable; LFT's normal -Clinically, patient denies any symptoms of bleeding  -See the infectious work-up as above -I have ordered abdominal US to assess for any liver disease  -Give the chronic, mild thrombocytopenia without any bleeding or excess bruising, there is no urgent indication for further work-up, unless he develops progressive thrombocytopenia or clinically suspicious symptoms   Orders Placed This Encounter  Procedures  . US Abdomen Complete    Standing Status:   Future    Standing Expiration Date:   04/23/2020    Order Specific Question:   Reason for Exam (SYMPTOM  OR DIAGNOSIS REQUIRED)    Answer:   thrombocytopenia, rule out liver disease    Order Specific Question:   Preferred imaging location?    Answer:   [facility]  . Flow Cytometry    Standing Status:   Future    Number of Occurrences:   1    Standing Expiration Date:   05/28/2020  . CBC with Differential (Cancer Center Only)    Standing Status:   Future    Standing Expiration Date:   05/28/2020  . CMP ([facility] only)    Standing Status:   Future    Standing Expiration Date:   05/28/2020  . Lactate dehydrogenase    Standing Status:   Future    Standing Expiration Date:   05/28/2020  . Save Smear ([facility])    Standing Status:   Future    Standing Expiration Date:   04/23/2020  . BCR ABL1 FISH ([facility])    Standing Status:   Future    Standing Expiration Date:  04/23/2020    The total time spent in the encounter was 55 minutes, including face-to-face time with the patient, review of various tests results, order additional studies/medications, documentation, and coordination of care plan.   All questions were answered. The patient knows to call the clinic with any problems, questions or concerns. No barriers to  learning was detected.  Return in 2 months for labs and clinic follow-up.  Tish Men, MD 3/22/202112:21 PM  CHIEF COMPLAINTS/PURPOSE OF CONSULTATION:  "My doctor told me to have my blood checked"  HISTORY OF PRESENTING ILLNESS:  Steven Patton 84 y.o. male is here because of chronic leukocytosis and intermittent mild thrombocytopenia.  Patient reports that he has been followed by his PCP for chronic, mildly elevated WBC for some time, but recently a peripheral blood smear was reviewed by a pathologist, which showed slightly increased absolute eosinophil count and mild thrombocytopenia, for which he was referred to hematology for further evaluation.  Patient denies any constitutional symptoms, lymphadenopathy, chest pain, dyspnea, abdominal pain, nausea, diarrhea, or abnormal bleeding/bruising.   REVIEW OF SYSTEMS:   Constitutional: ( - ) fevers, ( - )  chills , ( - ) night sweats Eyes: ( - ) blurriness of vision, ( - ) double vision, ( - ) watery eyes Ears, nose, mouth, throat, and face: ( - ) mucositis, ( - ) sore throat Respiratory: ( - ) cough, ( - ) dyspnea, ( - ) wheezes Cardiovascular: ( - ) palpitation, ( - ) chest discomfort, ( - ) lower extremity swelling Gastrointestinal:  ( - ) nausea, ( - ) heartburn, ( - ) change in bowel habits Skin: ( - ) abnormal skin rashes Lymphatics: ( - ) new lymphadenopathy, ( - ) easy bruising Neurological: ( - ) numbness, ( - ) tingling, ( - ) new weaknesses Behavioral/Psych: ( - ) mood change, ( - ) new changes  All other systems were reviewed with the patient and are negative.  I have reviewed his chart and materials related to his cancer extensively and collaborated history with the patient. Summary of oncologic history is as follows: Oncology History   No history exists.    MEDICAL HISTORY:  Past Medical History:  Diagnosis Date  . Arthritis   . Cataract   . Hypertension    takes Amlodipine,Lisinopril,and Metoprolol daily  . Lower  extremity weakness    Bilateral  . Macular degeneration, left eye   . Sleep apnea    2008.Marland KitchenMarland KitchenWEARS CPAP    SURGICAL HISTORY: Past Surgical History:  Procedure Laterality Date  . BACK SURGERY    . CARDIAC CATHETERIZATION  06-10-12  . COLONOSCOPY    . EYE SURGERY     CATARACT RIGHT EYE  . KNEE SURGERY    . LUMBAR LAMINECTOMY/DECOMPRESSION MICRODISCECTOMY N/A 11/02/2012   Procedure: LUMBAR LAMINECTOMY/DECOMPRESSION MICRODISCECTOMY 2 LEVEL;  Surgeon: Sinclair Ship, MD;  Location: [facility];  Service: Orthopedics;  Laterality: N/A;  Lumbar 4-5,lumbar 5-sacrum 1 decompression  . LUMBAR LAMINECTOMY/DECOMPRESSION MICRODISCECTOMY N/A 09/08/2017   Procedure: THORACIC 11-12 DECOMPRESSION;  Surgeon: Phylliss Bob, MD;  Location: [facility];  Service: Orthopedics;  Laterality: N/A;  . TONSILLECTOMY    . TOTAL HIP ARTHROPLASTY Left 01/06/2013   Procedure: LEFT TOTAL HIP ARTHROPLASTY-Posterior approach;  Surgeon: Alta Corning, MD;  Location: [facility];  Service: Orthopedics;  Laterality: Left;  . TOTAL HIP ARTHROPLASTY Right 04/21/2013   Procedure: TOTAL HIP ARTHROPLASTY ANTERIOR APPROACH;  Surgeon: Alta Corning, MD;  Location: [facility];  Service: Orthopedics;  Laterality: Right;  . TOTAL KNEE ARTHROPLASTY Left 09/16/2012   Procedure: TOTAL KNEE ARTHROPLASTY;  Surgeon: Alta Corning, MD;  Location: [facility];  Service: Orthopedics;  Laterality: Left;    SOCIAL HISTORY: Social History   Socioeconomic History  . Marital status: Married    Spouse name: Not on file  . Number of children: 5  . Years of education: Not on file  . Highest education level: Not on file  Occupational History  . Not on file  Tobacco Use  . Smoking status: Former Smoker    Packs/day: 1.00    Years: 20.00    Pack years: 20.00    Types: Cigarettes    Quit date: 01/04/1993    Years since quitting: 26.3  . Smokeless tobacco: Former Systems developer    Types: Chew    Quit date: 01/04/1993  . Tobacco comment: quit 60yr ago  Substance and Sexual  Activity  . Alcohol use: No  . Drug use: No  . Sexual activity: Not Currently    Birth control/protection: None  Other Topics Concern  . Not on file  Social History Narrative  . Not on file   Social Determinants of Health   Financial Resource Strain:   . Difficulty of Paying Living Expenses:   Food Insecurity:   . Worried About RCharity fundraiserin the Last Year:   . RArboriculturistin the Last Year:   Transportation Needs:   . LFilm/video editor(Medical):   .Marland KitchenLack of Transportation (Non-Medical):   Physical Activity:   . Days of Exercise per Week:   . Minutes of Exercise per Session:   Stress:   . Feeling of Stress :   Social Connections:   . Frequency of Communication with Friends and Family:   . Frequency of Social Gatherings with Friends and Family:   . Attends Religious Services:   . Active Member of Clubs or Organizations:   . Attends CArchivistMeetings:   .Marland KitchenMarital Status:   Intimate Partner Violence:   . Fear of Current or Ex-Partner:   . Emotionally Abused:   .Marland KitchenPhysically Abused:   . Sexually Abused:     FAMILY HISTORY: Family History  Problem Relation Age of Onset  . Cancer Daughter   . Alzheimer's disease Mother   . Lung cancer Father     ALLERGIES:  has No Known Allergies.  MEDICATIONS:  Current Outpatient Medications  Medication Sig Dispense Refill  . allopurinol (ZYLOPRIM) 300 MG tablet Take 1 tablet (300 mg total) by mouth daily. 180 tablet 1  . AMBULATORY NON FORMULARY MEDICATION Knee-high, firm compression, custom made, graduated compression stockings. Apply to lower extremities. 1 each prn  . amLODipine (NORVASC) 10 MG tablet TAKE 1 TABLET EVERY DAY 90 tablet 3  . atorvastatin (LIPITOR) 40 MG tablet TAKE 1 TABLET EVERY DAY  AT  6PM 90 tablet 3  . blood glucose meter kit and supplies KIT Dispense based on patient and insurance preference. Use up to four times daily as directed. (FOR ICD-9 250.00, 250.01). 1 each 0  .  gabapentin (NEURONTIN) 600 MG tablet TAKE 1 TABLET THREE TIMES DAILY 270 tablet 3  . hydrALAZINE (APRESOLINE) 25 MG tablet Take 1 tablet (25 mg total) by mouth 2 (two) times daily. Please make annual appt with Dr. CStanford Breedfor refills. [phone] 1st attempt. 60 tablet 0  . meloxicam (MOBIC) 15 MG tablet TAKE 1 TABLET EVERY DAY 90 tablet 3  . metFORMIN (GLUCOPHAGE)  1000 MG tablet Take 1 tablet (1,000 mg total) by mouth 2 (two) times daily with a meal. 180 tablet 3  . spironolactone (ALDACTONE) 25 MG tablet TAKE 1 TABLET EVERY DAY 90 tablet 1  . terbinafine (LAMISIL) 250 MG tablet Take 1 tablet (250 mg total) by mouth daily. 90 tablet 1  . torsemide (DEMADEX) 10 MG tablet Take 1 tablet (10 mg total) by mouth daily. 30 tablet 3  . traZODone (DESYREL) 100 MG tablet TAKE 1 TABLET AT BEDTIME 90 tablet 0  . nitroGLYCERIN (NITROSTAT) 0.4 MG SL tablet Place 1 tablet (0.4 mg total) under the tongue every 5 (five) minutes as needed for chest pain (or tightness). (Patient not taking: Reported on 04/24/2019) 30 tablet 0   No current facility-administered medications for this visit.    PHYSICAL EXAMINATION: ECOG PERFORMANCE STATUS: 2 - Symptomatic, <50% confined to bed  Vitals:   04/24/19 1056  BP: 123/63  Pulse: 77  Resp: 18  Temp: (!) 97.3 F (36.3 C)  SpO2: 93%   Filed Weights   04/24/19 1056  Weight: 297 lb (134.7 kg)    GENERAL: alert, no distress and comfortable, obese, slightly frail appearing  SKIN: skin color, texture, turgor are normal, no rashes or significant lesions EYES: conjunctiva are pink and non-injected, sclera clear OROPHARYNX: no exudate, no erythema; lips, buccal mucosa, and tongue normal  NECK: supple, non-tender LYMPH:  no palpable lymphadenopathy in the cervical LUNGS: clear to auscultation with normal breathing effort HEART: regular rate & rhythm, no murmurs, no lower extremity edema ABDOMEN: soft, non-tender, non-distended, normal bowel sounds Musculoskeletal:  no cyanosis of digits and no clubbing  PSYCH: alert & oriented x 3, fluent speech  LABORATORY DATA:  I have reviewed the data as listed Lab Results  Component Value Date   WBC 10.4 04/24/2019   HGB 14.7 04/24/2019   HCT 45.0 04/24/2019   MCV 96.4 04/24/2019   PLT 124 (L) 04/24/2019   Lab Results  Component Value Date   NA 141 04/24/2019   K 3.9 04/24/2019   CL 102 04/24/2019   CO2 28 04/24/2019    RADIOGRAPHIC STUDIES: I have personally reviewed the radiological images as listed and agreed with the findings in the report. No results found.  PATHOLOGY: I have reviewed the pathology reports as documented in the oncologist history.

## 2019-04-24 ENCOUNTER — Inpatient Hospital Stay (HOSPITAL_BASED_OUTPATIENT_CLINIC_OR_DEPARTMENT_OTHER): Payer: Medicare FFS | Admitting: Hematology

## 2019-04-24 ENCOUNTER — Encounter: Payer: Self-pay | Admitting: Hematology

## 2019-04-24 ENCOUNTER — Telehealth: Payer: Self-pay | Admitting: Hematology

## 2019-04-24 ENCOUNTER — Other Ambulatory Visit: Payer: Self-pay

## 2019-04-24 ENCOUNTER — Inpatient Hospital Stay: Payer: Medicare FFS | Attending: Hematology

## 2019-04-24 VITALS — BP 123/63 | HR 77 | Temp 97.3°F | Resp 18 | Ht 69.0 in | Wt 297.0 lb

## 2019-04-24 DIAGNOSIS — Z79899 Other long term (current) drug therapy: Secondary | ICD-10-CM | POA: Insufficient documentation

## 2019-04-24 DIAGNOSIS — Z791 Long term (current) use of non-steroidal anti-inflammatories (NSAID): Secondary | ICD-10-CM | POA: Insufficient documentation

## 2019-04-24 DIAGNOSIS — D696 Thrombocytopenia, unspecified: Secondary | ICD-10-CM

## 2019-04-24 DIAGNOSIS — Z801 Family history of malignant neoplasm of trachea, bronchus and lung: Secondary | ICD-10-CM | POA: Diagnosis not present

## 2019-04-24 DIAGNOSIS — I10 Essential (primary) hypertension: Secondary | ICD-10-CM

## 2019-04-24 DIAGNOSIS — Z7984 Long term (current) use of oral hypoglycemic drugs: Secondary | ICD-10-CM | POA: Insufficient documentation

## 2019-04-24 DIAGNOSIS — G4730 Sleep apnea, unspecified: Secondary | ICD-10-CM

## 2019-04-24 DIAGNOSIS — Z87891 Personal history of nicotine dependence: Secondary | ICD-10-CM | POA: Insufficient documentation

## 2019-04-24 DIAGNOSIS — D72829 Elevated white blood cell count, unspecified: Secondary | ICD-10-CM

## 2019-04-24 LAB — HIV ANTIBODY (ROUTINE TESTING W REFLEX): HIV Screen 4th Generation wRfx: NONREACTIVE

## 2019-04-24 LAB — SEDIMENTATION RATE: Sed Rate: 10 mm/hr (ref 0–16)

## 2019-04-24 LAB — CMP (CANCER CENTER ONLY)
ALT: 11 U/L (ref 0–44)
AST: 14 U/L — ABNORMAL LOW (ref 15–41)
Albumin: 4.5 g/dL (ref 3.5–5.0)
Alkaline Phosphatase: 41 U/L (ref 38–126)
Anion gap: 11 (ref 5–15)
BUN: 14 mg/dL (ref 8–23)
CO2: 28 mmol/L (ref 22–32)
Calcium: 9.6 mg/dL (ref 8.9–10.3)
Chloride: 102 mmol/L (ref 98–111)
Creatinine: 1.18 mg/dL (ref 0.61–1.24)
GFR, Est AFR Am: >60 mL/min (ref >60)
GFR, Estimated: 57 mL/min — ABNORMAL LOW (ref >60)
Glucose, Bld: 120 mg/dL — ABNORMAL HIGH (ref 70–99)
Potassium: 3.9 mmol/L (ref 3.5–5.1)
Sodium: 141 mmol/L (ref 135–145)
Total Bilirubin: 0.5 mg/dL (ref 0.3–1.2)
Total Protein: 7.3 g/dL (ref 6.5–8.1)

## 2019-04-24 LAB — CBC WITH DIFFERENTIAL (CANCER CENTER ONLY)
Abs Immature Granulocytes: 0.07 10*3/uL (ref 0.00–0.07)
Basophils Absolute: 0.1 10*3/uL (ref 0.0–0.1)
Basophils Relative: 1 %
Eosinophils Absolute: 0.5 10*3/uL (ref 0.0–0.5)
Eosinophils Relative: 5 %
HCT: 45 % (ref 39.0–52.0)
Hemoglobin: 14.7 g/dL (ref 13.0–17.0)
Immature Granulocytes: 1 %
Lymphocytes Relative: 23 %
Lymphs Abs: 2.4 10*3/uL (ref 0.7–4.0)
MCH: 31.5 pg (ref 26.0–34.0)
MCHC: 32.7 g/dL (ref 30.0–36.0)
MCV: 96.4 fL (ref 80.0–100.0)
Monocytes Absolute: 1.1 10*3/uL — ABNORMAL HIGH (ref 0.1–1.0)
Monocytes Relative: 10 %
Neutro Abs: 6.3 10*3/uL (ref 1.7–7.7)
Neutrophils Relative %: 60 %
Platelet Count: 124 10*3/uL — ABNORMAL LOW (ref 150–400)
RBC: 4.67 MIL/uL (ref 4.22–5.81)
RDW: 15.9 % — ABNORMAL HIGH (ref 11.5–15.5)
WBC Count: 10.4 10*3/uL (ref 4.0–10.5)
nRBC: 0 % (ref 0.0–0.2)

## 2019-04-24 LAB — HEPATITIS B SURFACE ANTIBODY,QUALITATIVE: Hep B S Ab: NONREACTIVE

## 2019-04-24 LAB — SAVE SMEAR(SSMR), FOR PROVIDER SLIDE REVIEW

## 2019-04-24 LAB — HEPATITIS B SURFACE ANTIGEN: Hepatitis B Surface Ag: NONREACTIVE

## 2019-04-24 LAB — LACTATE DEHYDROGENASE: LDH: 166 U/L (ref 98–192)

## 2019-04-24 LAB — C-REACTIVE PROTEIN: CRP: 1 mg/dL — ABNORMAL HIGH (ref <1.0)

## 2019-04-24 NOTE — Telephone Encounter (Signed)
Appointments scheduled calendar printed per 3/22 los 

## 2019-04-25 ENCOUNTER — Other Ambulatory Visit: Payer: Self-pay | Admitting: Hematology

## 2019-04-25 DIAGNOSIS — D72820 Lymphocytosis (symptomatic): Secondary | ICD-10-CM

## 2019-04-25 LAB — FLOW CYTOMETRY

## 2019-04-25 LAB — SURGICAL PATHOLOGY

## 2019-05-05 ENCOUNTER — Other Ambulatory Visit: Payer: Self-pay | Admitting: Sports Medicine

## 2019-05-05 ENCOUNTER — Ambulatory Visit (HOSPITAL_BASED_OUTPATIENT_CLINIC_OR_DEPARTMENT_OTHER)
Admission: RE | Admit: 2019-05-05 | Discharge: 2019-05-05 | Disposition: A | Discharge status: Home / Self Care | Payer: Medicare FFS | Source: Ambulatory Visit | Attending: Hematology | Admitting: Hematology

## 2019-05-05 ENCOUNTER — Other Ambulatory Visit: Payer: Self-pay

## 2019-05-05 DIAGNOSIS — R590 Localized enlarged lymph nodes: Secondary | ICD-10-CM | POA: Diagnosis not present

## 2019-05-05 DIAGNOSIS — I700 Atherosclerosis of aorta: Secondary | ICD-10-CM | POA: Insufficient documentation

## 2019-05-05 DIAGNOSIS — R911 Solitary pulmonary nodule: Secondary | ICD-10-CM | POA: Diagnosis not present

## 2019-05-05 DIAGNOSIS — M109 Gout, unspecified: Secondary | ICD-10-CM

## 2019-05-05 DIAGNOSIS — M1A0720 Idiopathic chronic gout, left ankle and foot, without tophus (tophi): Secondary | ICD-10-CM

## 2019-05-05 DIAGNOSIS — D72820 Lymphocytosis (symptomatic): Secondary | ICD-10-CM | POA: Diagnosis present

## 2019-05-05 MED ORDER — IOHEXOL 300 MG/ML  SOLN
100.0000 mL | Freq: Once | INTRAMUSCULAR | Status: AC | PRN
  Administered 2019-05-05: 100 mL via INTRAVENOUS

## 2019-05-10 ENCOUNTER — Telehealth: Payer: Self-pay | Admitting: Neurology

## 2019-05-10 NOTE — Telephone Encounter (Signed)
Called the patient's daughter back and advised her that I would recommend that he continue with the plan and come to the NCV/EMG. I asked what happened to getting the MRI scheduled. Turns out the patient needed to have this completed through the [facility]. They state since that call come through from the MRI person they have not heard from anyone else. I advised the patient's daughter I would follow up with our MRI coordinator to check on this status.

## 2019-05-10 NOTE — Telephone Encounter (Signed)
[name] the daughter of the pt on the DPR called wanting to know if the pt should still come in for his NCV/EMG that is scheduled on Monday since he was not able to get the MRI done.

## 2019-05-11 ENCOUNTER — Other Ambulatory Visit: Payer: Self-pay | Admitting: Neurology

## 2019-05-11 DIAGNOSIS — M4712 Other spondylosis with myelopathy, cervical region: Secondary | ICD-10-CM

## 2019-05-11 DIAGNOSIS — M48062 Spinal stenosis, lumbar region with neurogenic claudication: Secondary | ICD-10-CM

## 2019-05-11 DIAGNOSIS — M5412 Radiculopathy, cervical region: Secondary | ICD-10-CM

## 2019-05-11 DIAGNOSIS — R253 Fasciculation: Secondary | ICD-10-CM

## 2019-05-11 DIAGNOSIS — G959 Disease of spinal cord, unspecified: Secondary | ICD-10-CM

## 2019-05-11 DIAGNOSIS — R262 Difficulty in walking, not elsewhere classified: Secondary | ICD-10-CM

## 2019-05-11 DIAGNOSIS — R200 Anesthesia of skin: Secondary | ICD-10-CM

## 2019-05-11 NOTE — Telephone Encounter (Signed)
[facility] reach out to the patient on 04/13/19.Marland Kitchen the patient is in a wheel chair so he will have to have his MRI done at [facility]. Because they will have to use the lift. When you get a chance can you put a new order in for [facility] thank you!!

## 2019-05-11 NOTE — Telephone Encounter (Signed)
MRI order is placed.

## 2019-05-15 ENCOUNTER — Ambulatory Visit: Payer: Medicare FFS | Admitting: Neurology

## 2019-05-15 ENCOUNTER — Ambulatory Visit (INDEPENDENT_AMBULATORY_CARE_PROVIDER_SITE_OTHER): Payer: Medicare FFS | Admitting: Neurology

## 2019-05-15 ENCOUNTER — Encounter: Payer: Self-pay | Admitting: Neurology

## 2019-05-15 ENCOUNTER — Other Ambulatory Visit: Payer: Self-pay

## 2019-05-15 DIAGNOSIS — N1831 Chronic kidney disease, stage 3a: Secondary | ICD-10-CM

## 2019-05-15 DIAGNOSIS — Z87898 Personal history of other specified conditions: Secondary | ICD-10-CM

## 2019-05-15 DIAGNOSIS — R531 Weakness: Secondary | ICD-10-CM | POA: Diagnosis not present

## 2019-05-15 DIAGNOSIS — E1139 Type 2 diabetes mellitus with other diabetic ophthalmic complication: Secondary | ICD-10-CM

## 2019-05-15 DIAGNOSIS — M48062 Spinal stenosis, lumbar region with neurogenic claudication: Secondary | ICD-10-CM

## 2019-05-15 DIAGNOSIS — R262 Difficulty in walking, not elsewhere classified: Secondary | ICD-10-CM

## 2019-05-15 DIAGNOSIS — R200 Anesthesia of skin: Secondary | ICD-10-CM

## 2019-05-15 DIAGNOSIS — M4712 Other spondylosis with myelopathy, cervical region: Secondary | ICD-10-CM

## 2019-05-15 DIAGNOSIS — R253 Fasciculation: Secondary | ICD-10-CM

## 2019-05-15 DIAGNOSIS — M5412 Radiculopathy, cervical region: Secondary | ICD-10-CM

## 2019-05-15 NOTE — Progress Notes (Signed)
Please refer to EMG and nerve conduction procedure note.  

## 2019-05-15 NOTE — Procedures (Signed)
HISTORY:  Steven Patton is an 84 year old gentleman with a history of multiple lumbosacral, thoracic, and cervical spine surgeries.  The patient has had some weakness of the arms and legs over the last year that has gradually worsened.  The patient denies any pain in the neck or low back or going down the arms or legs.  He has had a left total knee replacement and bilateral hip replacements.  He is being evaluated for the muscle weakness.  NERVE CONDUCTION STUDIES:  Nerve conduction studies were performed on the left upper extremity.  The distal motor latencies for the left median and ulnar nerves were prolonged with low motor amplitudes seen for these nerves.  The nerve conduction velocities for the left median and ulnar nerves were normal.  The left radial, median, and ulnar sensory latencies were normal.  The left ulnar F-wave latency was prolonged.  Nerve conduction studies were performed on the left lower extremity.  The distal motor latencies for the left peroneal and posterior tibial nerves were normal with low motor amplitudes seen for these nerves.  Slowing was seen for the left peroneal and posterior tibial nerves.  The left sural and left peroneal sensory latencies were normal.  The F-wave latency of the left posterior tibial nerve was prolonged.  EMG STUDIES:  EMG study was performed on the left upper extremity:  The first dorsal interosseous muscle reveals 2 to 4 K units with full recruitment. No fibrillations or positive waves were noted. The abductor pollicis brevis muscle reveals 2 to 4 K units with full recruitment. No fibrillations or positive waves were noted. The extensor indicis proprius muscle reveals 1 to 3 K units with full recruitment. No fibrillations or positive waves were noted. The pronator teres muscle reveals 2 to 5 K units with decreased recruitment. No fibrillations or positive waves were noted. The biceps muscle reveals 1 to 4 K units with decreased  recruitment. No fibrillations or positive waves were noted. The triceps muscle reveals 2 to 7 K units with decreased recruitment. No fibrillations or positive waves were noted. The anterior deltoid muscle reveals 2 to 5 K units with decreased recruitment. No fibrillations or positive waves were noted. The cervical paraspinal muscles were tested at 2 levels. No abnormalities of insertional activity were seen at either level tested. There was good relaxation.  EMG study was performed on the left lower extremity:  The tibialis anterior muscle reveals 2 to 4K motor units with full recruitment. No fibrillations or positive waves were seen. The peroneus tertius muscle reveals 2 to 4K motor units with decreased recruitment. No fibrillations or positive waves were seen. The medial gastrocnemius muscle reveals 1 to 5K motor units with decreased recruitment. No fibrillations or positive waves were seen. The vastus lateralis muscle reveals 2 to 4K motor units with full recruitment. No fibrillations or positive waves were seen. The iliopsoas muscle reveals 2 to 4K motor units with full recruitment. No fibrillations or positive waves were seen. The biceps femoris muscle (long head) reveals 2 to 4K motor units with full recruitment. No fibrillations or positive waves were seen. The lumbosacral paraspinal muscles were tested at 3 levels, and revealed no abnormalities of insertional activity at all 3 levels tested. There was good relaxation.   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  EMG of the left lower extremity shows chronic stable neuropathic denervation primarily in the S1 nerve root distribution.   There is no clear evidence of an overlying myopathic disorder or evidence of active neuropathic denervation.  An anterior horn cell disease process cannot be confirmed by the study.  Marlan Palau MD 05/15/2019 3:12 PM  [facility] [address] [place], [place] [address]  Phone [phone] Fax [phone]

## 2019-05-15 NOTE — Progress Notes (Signed)
[facility]    Nerve / Sites Muscle Latency Ref. Amplitude Ref. Rel Amp Segments Distance Velocity Ref. Area    ms ms mV mV %  cm m/s m/s mVms  L Median - APB     Wrist APB 5.8 ?4.4 1.8 ?4.0 100 Wrist - APB 7   3.1     Upper arm APB 10.5  1.0  54 Upper arm - Wrist 23 50 ?49 3.9  L Ulnar - ADM     Wrist ADM 3.6 ?3.3 3.6 ?6.0 100 Wrist - ADM 7   7.7     B.Elbow ADM 7.5  6.8  189 B.Elbow - Wrist 20 52 ?49 22.9     A.Elbow ADM 9.4  6.7  98.8 A.Elbow - B.Elbow 10 51 ?49 23.4         A.Elbow - Wrist      L Peroneal - EDB     Ankle EDB 5.2 ?6.5 0.7 ?2.0 100 Ankle - EDB 9   2.2     Fib head EDB 13.3  0.5  68.4 Fib head - Ankle 29 36 ?44 1.6     Pop fossa EDB 15.7  0.6  114 Pop fossa - Fib head 10 42 ?44 1.6         Pop fossa - Ankle      L Tibial - AH     Ankle AH 5.6 ?5.8 1.5 ?4.0 100 Ankle - AH 9   4.0     Pop fossa AH 16.2  1.1  71.2 Pop fossa - Ankle 38 36 ?41 2.5             SNC    Nerve / Sites Rec. Site Peak Lat Ref.  Amp Ref. Segments Distance    ms ms V V  cm  L Radial - Anatomical snuff box (Forearm)     Forearm Wrist 2.3 ?2.9 11 ?15 Forearm - Wrist 10  L Sural - Ankle (Calf)     Calf Ankle 3.5 ?4.4 3 ?6 Calf - Ankle 14  L Superficial peroneal - Ankle     Lat leg Ankle 3.9 ?4.4 2 ?6 Lat leg - Ankle 14  L Median - Orthodromic (Dig II, Mid palm)     Dig II Wrist 3.8 ?3.4 7 ?10 Dig II - Wrist 13  L Ulnar - Orthodromic, (Dig V, Mid palm)     Dig V Wrist 2.8 ?3.1 3 ?5 Dig V - Wrist 11               F  Wave    Nerve F Lat Ref.   ms ms  L Tibial - AH 61.8 ?56.0  L Ulnar - ADM 35.7 ?32.0

## 2019-05-15 NOTE — Progress Notes (Signed)
IMPRESSION:   Nerve conduction studies done on the left upper and left lower  extremity shows evidence of what appears to be primarily a motor  neuropathy with sensory sparing. EMG evaluation of the left  upper extremity shows evidence of chronic stable neuropathic  denervation in the C7 level primarily with some involvement in  the C5 and C6 levels as well. EMG of the left lower extremity  shows chronic stable neuropathic denervation primarily in the S1  nerve root distribution. There is no clear evidence of an  overlying myopathic disorder or evidence of active neuropathic  denervation. An anterior horn cell disease process cannot be  confirmed by the study.

## 2019-05-16 ENCOUNTER — Encounter: Payer: Self-pay | Admitting: Neurology

## 2019-05-16 DIAGNOSIS — E66813 Obesity, class 3: Secondary | ICD-10-CM

## 2019-05-17 ENCOUNTER — Other Ambulatory Visit: Payer: Self-pay | Admitting: Neurology

## 2019-05-17 DIAGNOSIS — M5412 Radiculopathy, cervical region: Secondary | ICD-10-CM

## 2019-05-17 DIAGNOSIS — M4712 Other spondylosis with myelopathy, cervical region: Secondary | ICD-10-CM

## 2019-05-17 DIAGNOSIS — M48062 Spinal stenosis, lumbar region with neurogenic claudication: Secondary | ICD-10-CM

## 2019-05-17 DIAGNOSIS — R200 Anesthesia of skin: Secondary | ICD-10-CM

## 2019-05-17 DIAGNOSIS — R253 Fasciculation: Secondary | ICD-10-CM

## 2019-05-17 DIAGNOSIS — R262 Difficulty in walking, not elsewhere classified: Secondary | ICD-10-CM

## 2019-05-17 DIAGNOSIS — Z87898 Personal history of other specified conditions: Secondary | ICD-10-CM

## 2019-05-17 NOTE — Telephone Encounter (Signed)
Noted, I checked order on my end and didn't see anything that needed attention, maybe its just needing Dr Vickey Huger to sign off on the verbal order. thanks

## 2019-05-17 NOTE — Telephone Encounter (Signed)
Patient is scheduled at [facility] for 05/27/19 arrival time is 2:30 pm and for him to check in through the ER. I did send the patient a mychart message with this information. I also left their number of [phone] incase he needed to r/s.  Also, the scheduler Hilda Lias stated the order had a red X next to the order so I am not sure what that is.  Francine Graven Berkley Harvey: [phone] (exp. 05/17/19 to 06/16/19)

## 2019-05-17 NOTE — Telephone Encounter (Signed)
I don't see a new MRI order in there for [facility]. I only see is the one from 04/06/19 for GI.

## 2019-05-17 NOTE — Telephone Encounter (Signed)
Routing to provider  

## 2019-05-27 ENCOUNTER — Other Ambulatory Visit: Payer: Self-pay

## 2019-05-27 ENCOUNTER — Ambulatory Visit (HOSPITAL_COMMUNITY)
Admission: RE | Admit: 2019-05-27 | Discharge: 2019-05-27 | Disposition: A | Discharge status: Home / Self Care | Payer: Medicare FFS | Source: Ambulatory Visit | Attending: Neurology | Admitting: Neurology

## 2019-05-27 DIAGNOSIS — Z87898 Personal history of other specified conditions: Secondary | ICD-10-CM | POA: Insufficient documentation

## 2019-05-27 DIAGNOSIS — M4712 Other spondylosis with myelopathy, cervical region: Secondary | ICD-10-CM | POA: Insufficient documentation

## 2019-05-27 DIAGNOSIS — R200 Anesthesia of skin: Secondary | ICD-10-CM | POA: Insufficient documentation

## 2019-05-27 DIAGNOSIS — R262 Difficulty in walking, not elsewhere classified: Secondary | ICD-10-CM | POA: Insufficient documentation

## 2019-05-27 DIAGNOSIS — M5412 Radiculopathy, cervical region: Secondary | ICD-10-CM

## 2019-05-27 DIAGNOSIS — M48062 Spinal stenosis, lumbar region with neurogenic claudication: Secondary | ICD-10-CM | POA: Insufficient documentation

## 2019-05-27 DIAGNOSIS — R253 Fasciculation: Secondary | ICD-10-CM | POA: Diagnosis present

## 2019-05-27 MED ORDER — GADOBUTROL 1 MMOL/ML IV SOLN
10.0000 mL | Freq: Once | INTRAVENOUS | Status: AC | PRN
  Administered 2019-05-27: 10 mL via INTRAVENOUS

## 2019-05-29 NOTE — Progress Notes (Signed)
Vertebrae: Previous C5 and C6 corpectomy with strut graft. Apparently solid fusion from C4 through C7.  Cord: No ongoing cord compression. Chronic cord atrophy with focal myelomalacia at the C5-6 level.  This is a chronic myelomalacia, no new stenosis, no compression.

## 2019-05-30 ENCOUNTER — Encounter: Payer: Self-pay | Admitting: Neurology

## 2019-05-30 DIAGNOSIS — I10 Essential (primary) hypertension: Secondary | ICD-10-CM

## 2019-05-30 NOTE — Assessment & Plan Note (Signed)
Lenard Galloway has been treated aggressively for hypertension, he continues to be somewhat orthostatic, we had decreased his carvedilol down to 12.5 mg daily, and obtain some labs, symptoms had improved but unfortunately he is having a worsening of symptoms including micturition/defecation syncope, as well as relatively low blood pressures. Discontinue amlodipine for now, ultimately he will need to come in and we will need to likely get an echo.

## 2019-06-06 ENCOUNTER — Inpatient Hospital Stay: Payer: Medicare FFS | Attending: Hematology

## 2019-06-06 ENCOUNTER — Encounter: Payer: Self-pay | Admitting: Hematology

## 2019-06-06 ENCOUNTER — Other Ambulatory Visit: Payer: Self-pay

## 2019-06-06 ENCOUNTER — Inpatient Hospital Stay (HOSPITAL_BASED_OUTPATIENT_CLINIC_OR_DEPARTMENT_OTHER): Payer: Medicare FFS | Admitting: Hematology

## 2019-06-06 ENCOUNTER — Telehealth: Payer: Self-pay | Admitting: Hematology

## 2019-06-06 VITALS — BP 150/73 | HR 77 | Temp 97.7°F | Resp 18 | Ht 69.0 in

## 2019-06-06 DIAGNOSIS — D696 Thrombocytopenia, unspecified: Secondary | ICD-10-CM | POA: Insufficient documentation

## 2019-06-06 DIAGNOSIS — D72820 Lymphocytosis (symptomatic): Secondary | ICD-10-CM

## 2019-06-06 DIAGNOSIS — R911 Solitary pulmonary nodule: Secondary | ICD-10-CM | POA: Insufficient documentation

## 2019-06-06 DIAGNOSIS — D72829 Elevated white blood cell count, unspecified: Secondary | ICD-10-CM

## 2019-06-06 LAB — CMP (CANCER CENTER ONLY)
ALT: 11 U/L (ref 0–44)
AST: 14 U/L — ABNORMAL LOW (ref 15–41)
Albumin: 4.5 g/dL (ref 3.5–5.0)
Alkaline Phosphatase: 37 U/L — ABNORMAL LOW (ref 38–126)
Anion gap: 9 (ref 5–15)
BUN: 15 mg/dL (ref 8–23)
CO2: 30 mmol/L (ref 22–32)
Calcium: 9.8 mg/dL (ref 8.9–10.3)
Chloride: 102 mmol/L (ref 98–111)
Creatinine: 1.36 mg/dL — ABNORMAL HIGH (ref 0.61–1.24)
GFR, Est AFR Am: 56 mL/min — ABNORMAL LOW (ref >60)
GFR, Estimated: 48 mL/min — ABNORMAL LOW (ref >60)
Glucose, Bld: 121 mg/dL — ABNORMAL HIGH (ref 70–99)
Potassium: 4.6 mmol/L (ref 3.5–5.1)
Sodium: 141 mmol/L (ref 135–145)
Total Bilirubin: 0.5 mg/dL (ref 0.3–1.2)
Total Protein: 7 g/dL (ref 6.5–8.1)

## 2019-06-06 LAB — CBC WITH DIFFERENTIAL (CANCER CENTER ONLY)
Abs Immature Granulocytes: 0.05 10*3/uL (ref 0.00–0.07)
Basophils Absolute: 0.1 10*3/uL (ref 0.0–0.1)
Basophils Relative: 1 %
Eosinophils Absolute: 0.5 10*3/uL (ref 0.0–0.5)
Eosinophils Relative: 5 %
HCT: 44.4 % (ref 39.0–52.0)
Hemoglobin: 14.3 g/dL (ref 13.0–17.0)
Immature Granulocytes: 1 %
Lymphocytes Relative: 26 %
Lymphs Abs: 2.7 10*3/uL (ref 0.7–4.0)
MCH: 31.6 pg (ref 26.0–34.0)
MCHC: 32.2 g/dL (ref 30.0–36.0)
MCV: 98.2 fL (ref 80.0–100.0)
Monocytes Absolute: 0.8 10*3/uL (ref 0.1–1.0)
Monocytes Relative: 8 %
Neutro Abs: 6.2 10*3/uL (ref 1.7–7.7)
Neutrophils Relative %: 59 %
Platelet Count: 101 10*3/uL — ABNORMAL LOW (ref 150–400)
RBC: 4.52 MIL/uL (ref 4.22–5.81)
RDW: 15.6 % — ABNORMAL HIGH (ref 11.5–15.5)
WBC Count: 10.3 10*3/uL (ref 4.0–10.5)
nRBC: 0 % (ref 0.0–0.2)

## 2019-06-06 LAB — SAVE SMEAR(SSMR), FOR PROVIDER SLIDE REVIEW

## 2019-06-06 LAB — LACTATE DEHYDROGENASE: LDH: 158 U/L (ref 98–192)

## 2019-06-06 NOTE — Telephone Encounter (Signed)
Appointments scheduled calendar printed per 5/4 los 

## 2019-06-06 NOTE — Progress Notes (Signed)
[place] OFFICE PROGRESS NOTE  Patient Care Team: Silverio Decamp, MD as PCP - General (Family Medicine) Lelon Perla, MD as PCP - Cardiology (Cardiology)  HEME/ONC OVERVIEW: 1. Monoclonal B-cell lymphocytosis  -Chronic mild leukocytosis between normal and 13k since 2017 with mild intermittent thrombocytopenia  -04/2019:  Kappa-restricted B-cell population comprising 23% of lymphocytes, c/w CLL   Borderline to mild lymphadenopathy in the mediastinum and left hilum, no other lymphadenopathy  -On observation  2. RUL nodule  -Incidental 78m RUL nodule on CT in 05/2019   ASSESSMENT & PLAN:    Monoclonal B-cell lymphocytosis  -I independently reviewed the radiologic images chest, abdomen and pelvis, and agree with findings documented.  In summary, CT showed borderline to mild lymphadenopathy in the mediastinum and left hilum, but there was no other lymphadenopathy in the chest, abdomen, or pelvis. -I discussed the lab and imaging results in detail with the patient -Given that the absolute lymphocyte count is less than 5000, the presence of abnormal kappa restricted B cells in the peripheral blood is consistent with monoclonal B-cell lymphocytosis -I reviewed the pathophysiology of the diagnosis in detail with the patient -In absence of any constitutional symptoms, progressive cytopenias, or hepatosplenomegaly, there is no indication for treatment -I counseled the patient on some of the concerning symptoms, including but not limited to, unexplained persistent fever, drenching night sweats, unexplained weight loss, and progressive lymphadenopathy, for which he should contact [place]  Thrombocytopenia -Chronic intermittent thrombocytopenia since 2017 -Plts 101k today, stable; no evidence of liver disease on CT  -Clinically, patient denies any symptoms of bleeding  -[id number] monitoring for now   Orders Placed This Encounter  Procedures   . CBC with Differential (Cancer Center Only)    Standing Status:   Future    Standing Expiration Date:   07/10/2020  . CMP ([facility])    Standing Status:   Future    Standing Expiration Date:   07/10/2020  . Lactate dehydrogenase    Standing Status:   Future    Standing Expiration Date:   07/10/2020  . Save Smear (SSMR)    Standing Status:   Future    Standing Expiration Date:   06/05/2020   The total time spent in the encounter was 30 minutes, including face-to-face time with the patient, review of various tests results, order additional studies/medications, documentation, and coordination of care plan.   All questions were answered. The patient knows to call the clinic with any problems, questions or concerns. No barriers to learning was detected.  Return in 6 months for labs and clinic follow-up.  Steven Patton 5/4/202110:56 AM  CHIEF COMPLAINT: "I am doing fine"  INTERVAL HISTORY: Mr. BuFleischereturns clinic for follow-up of chronic leukocytosis.  The patient is accompanied by family member.  Patient reports that he has been doing well since last visit, and denies any constitutional symptoms.  He denies any other complaint today.  REVIEW OF SYSTEMS:   Constitutional: ( - ) fevers, ( - )  chills , ( - ) night sweats Eyes: ( - ) blurriness of vision, ( - ) double vision, ( - ) watery eyes Ears, nose, mouth, throat, and face: ( - ) mucositis, ( - ) sore throat Respiratory: ( - ) cough, ( - ) dyspnea, ( - ) wheezes Cardiovascular: ( - ) palpitation, ( - ) chest discomfort, ( - ) lower extremity swelling Gastrointestinal:  ( - ) nausea, ( - )  heartburn, ( - ) change in bowel habits Skin: ( - ) abnormal skin rashes Lymphatics: ( - ) new lymphadenopathy, ( - ) easy bruising Neurological: ( - ) numbness, ( - ) tingling, ( - ) new weaknesses Behavioral/Psych: ( - ) mood change, ( - ) new changes  All other systems were reviewed with the patient and are negative.  SUMMARY OF  ONCOLOGIC HISTORY: Oncology History   No history exists.    I have reviewed the past medical history, past surgical history, social history and family history with the patient and they are unchanged from previous note.  ALLERGIES:  has No Known Allergies.  MEDICATIONS:  Current Outpatient Medications  Medication Sig Dispense Refill  . allopurinol (ZYLOPRIM) 300 MG tablet TAKE 1 TABLET TWICE DAILY 180 tablet 1  . AMBULATORY NON FORMULARY MEDICATION Knee-high, firm compression, custom made, graduated compression stockings. Apply to lower extremities. 1 each prn  . atorvastatin (LIPITOR) 40 MG tablet TAKE 1 TABLET EVERY DAY  AT  6PM 90 tablet 3  . blood glucose meter kit and supplies KIT Dispense based on patient and insurance preference. Use up to four times daily as directed. (FOR ICD-9 250.00, 250.01). 1 each 0  . gabapentin (NEURONTIN) 600 MG tablet TAKE 1 TABLET THREE TIMES DAILY 270 tablet 3  . hydrALAZINE (APRESOLINE) 25 MG tablet Take 1 tablet (25 mg total) by mouth 2 (two) times daily. Please make annual appt with Dr. Stanford Breed for refills. [phone]. 1st attempt. 60 tablet 0  . meloxicam (MOBIC) 15 MG tablet TAKE 1 TABLET EVERY DAY 90 tablet 3  . metFORMIN (GLUCOPHAGE) 1000 MG tablet Take 1 tablet (1,000 mg total) by mouth 2 (two) times daily with a meal. 180 tablet 3  . spironolactone (ALDACTONE) 25 MG tablet TAKE 1 TABLET EVERY DAY 90 tablet 1  . torsemide (DEMADEX) 10 MG tablet Take 1 tablet (10 mg total) by mouth daily. 30 tablet 3  . traZODone (DESYREL) 100 MG tablet TAKE 1 TABLET AT BEDTIME 90 tablet 0  . nitroGLYCERIN (NITROSTAT) 0.4 MG SL tablet Place 1 tablet (0.4 mg total) under the tongue every 5 (five) minutes as needed for chest pain (or tightness). (Patient not taking: Reported on 06/06/2019) 30 tablet 0   No current facility-administered medications for this visit.    PHYSICAL EXAMINATION:  Today's Vitals   06/06/19 1002  BP: (!) 150/73  Pulse: 77  Resp: 18   Temp: 97.7 F (36.5 C)  TempSrc: Temporal  SpO2: 96%  Height: '5\' 9"'$  (1.753 m)  PainSc: 0-No pain   Body mass index is 43.86 kg/m.  Filed Weights    GENERAL: alert, no distress and comfortable sitting in a mobile wheelchair  SKIN: skin color, texture, turgor are normal, no rashes or significant lesions EYES: conjunctiva are pink and non-injected, sclera clear NECK: supple, non-tender LYMPH:  no palpable lymphadenopathy in the cervical LUNGS: clear to auscultation with normal breathing effort HEART: regular rate & rhythm and no murmurs and no lower extremity edema ABDOMEN: soft, non-tender, non-distended, normal bowel sounds Musculoskeletal: no cyanosis of digits and no clubbing  PSYCH: alert, fluent speech  LABORATORY DATA:  I have reviewed the data as listed    Component Value Date/Time   NA 141 06/06/2019 0944   NA 139 03/25/2017 1258   K 4.6 06/06/2019 0944   CL 102 06/06/2019 0944   CO2 30 06/06/2019 0944   GLUCOSE 121 (H) 06/06/2019 0944   BUN 15 06/06/2019 0944   BUN 49 (  H) 03/25/2017 1258   CREATININE 1.36 (H) 06/06/2019 0944   CREATININE 1.27 (H) 04/04/2019 0932   CALCIUM 9.8 06/06/2019 0944   PROT 7.0 06/06/2019 0944   ALBUMIN 4.5 06/06/2019 0944   AST 14 (L) 06/06/2019 0944   ALT 11 06/06/2019 0944   ALKPHOS 37 (L) 06/06/2019 0944   BILITOT 0.5 06/06/2019 0944   GFRNONAA 48 (L) 06/06/2019 0944   GFRNONAA 52 (L) 04/04/2019 0932   GFRAA 56 (L) 06/06/2019 0944   GFRAA 61 04/04/2019 0932    No results found for: SPEP, UPEP  Lab Results  Component Value Date   WBC 10.3 06/06/2019   NEUTROABS 6.2 06/06/2019   HGB 14.3 06/06/2019   HCT 44.4 06/06/2019   MCV 98.2 06/06/2019   PLT 101 (L) 06/06/2019      Chemistry      Component Value Date/Time   NA 141 06/06/2019 0944   NA 139 03/25/2017 1258   K 4.6 06/06/2019 0944   CL 102 06/06/2019 0944   CO2 30 06/06/2019 0944   BUN 15 06/06/2019 0944   BUN 49 (H) 03/25/2017 1258   CREATININE 1.36 (H)  06/06/2019 0944   CREATININE 1.27 (H) 04/04/2019 0932      Component Value Date/Time   CALCIUM 9.8 06/06/2019 0944   ALKPHOS 37 (L) 06/06/2019 0944   AST 14 (L) 06/06/2019 0944   ALT 11 06/06/2019 0944   BILITOT 0.5 06/06/2019 0944       RADIOGRAPHIC STUDIES: I have personally reviewed the radiological images as listed below and agreed with the findings in the report. MR CERVICAL SPINE W WO CONTRAST  Result Date: 05/28/2019 CLINICAL DATA:  Follow-up previous spinal cord injury. EXAM: MRI CERVICAL SPINE WITHOUT AND WITH CONTRAST TECHNIQUE: Multiplanar and multiecho pulse sequences of the cervical spine, to include the craniocervical junction and cervicothoracic junction, were obtained without and with intravenous contrast. CONTRAST:  44m GADAVIST GADOBUTROL 1 MMOL/ML IV SOLN COMPARISON:  08/21/2017 FINDINGS: Alignment: Straightening of the normal cervical lordosis. Vertebrae: Previous C5 and C6 corpectomy with strut graft. Apparently solid fusion from C4 through C7. Cord: No ongoing cord compression. Chronic cord atrophy with focal myelomalacia at the C5-6 level. Posterior Fossa, vertebral arteries, paraspinal tissues: Negative Disc levels: Foramen magnum widely patent. Ordinary osteoarthritis at the C1-2 articulation but without encroachment upon the neural structures. C2-3: Mild disc bulge.  No canal or foraminal stenosis. C3-4: Disc bulge indents the ventral subarachnoid space but does not cause compressive canal narrowing or any significant foraminal narrowing. C4 through C7: Solid fusion. Sufficient patency of the canal and foramina. C7-T1: Bilateral facet osteoarthritis of a mild degree. Mild bulging of the disc. No canal stenosis. Bilateral foraminal stenosis right more than left could affect either C8 nerve, more likely the right. No change appreciable compared to the study of 2019. IMPRESSION: Good appearance in the fusion segment from C4-C7. Wide patency of the canal and foramina  presently. Cord atrophy in the region with focal gliosis at C5-6 as seen previously. C7-T1 degenerative facet arthropathy and uncovertebral prominence with bilateral foraminal stenosis right more than left that could compress either C8 nerve, more likely the right. Similar appearance to the previous study. Electronically Signed   By: MNelson ChimesM.D.   On: 05/28/2019 17:28   NCV with EMG(electromyography)  Result Date: 05/15/2019 WKathrynn Ducking MD     05/15/2019  3:21 PM HISTORY: HHimmat Enbergis an [age]year old gentleman with a history of multiple lumbosacral, thoracic, and cervical spine surgeries.  The patient has had some weakness of the arms and legs over the last year that has gradually worsened.  The patient denies any pain in the neck or low back or going down the arms or legs.  He has had a left total knee replacement and bilateral hip replacements.  He is being evaluated for the muscle weakness. NERVE CONDUCTION STUDIES: Nerve conduction studies were performed on the left upper extremity.  The distal motor latencies for the left median and ulnar nerves were prolonged with low motor amplitudes seen for these nerves.  The nerve conduction velocities for the left median and ulnar nerves were normal.  The left radial, median, and ulnar sensory latencies were normal.  The left ulnar F-wave latency was prolonged. Nerve conduction studies were performed on the left lower extremity.  The distal motor latencies for the left peroneal and posterior tibial nerves were normal with low motor amplitudes seen for these nerves.  Slowing was seen for the left peroneal and posterior tibial nerves.  The left sural and left peroneal sensory latencies were normal.  The F-wave latency of the left posterior tibial nerve was prolonged. EMG STUDIES: EMG study was performed on the left upper extremity: The first dorsal interosseous muscle reveals 2 to 4 K units with full recruitment. No fibrillations or positive waves were  noted. The abductor pollicis brevis muscle reveals 2 to 4 K units with full recruitment. No fibrillations or positive waves were noted. The extensor indicis proprius muscle reveals 1 to 3 K units with full recruitment. No fibrillations or positive waves were noted. The pronator teres muscle reveals 2 to 5 K units with decreased recruitment. No fibrillations or positive waves were noted. The biceps muscle reveals 1 to 4 K units with decreased recruitment. No fibrillations or positive waves were noted. The triceps muscle reveals 2 to 7 K units with decreased recruitment. No fibrillations or positive waves were noted. The anterior deltoid muscle reveals 2 to 5 K units with decreased recruitment. No fibrillations or positive waves were noted. The cervical paraspinal muscles were tested at 2 levels. No abnormalities of insertional activity were seen at either level tested. There was good relaxation. EMG study was performed on the left lower extremity: The tibialis anterior muscle reveals 2 to 4K motor units with full recruitment. No fibrillations or positive waves were seen. The peroneus tertius muscle reveals 2 to 4K motor units with decreased recruitment. No fibrillations or positive waves were seen. The medial gastrocnemius muscle reveals 1 to 5K motor units with decreased recruitment. No fibrillations or positive waves were seen. The vastus lateralis muscle reveals 2 to 4K motor units with full recruitment. No fibrillations or positive waves were seen. The iliopsoas muscle reveals 2 to 4K motor units with full recruitment. No fibrillations or positive waves were seen. The biceps femoris muscle (long head) reveals 2 to 4K motor units with full recruitment. No fibrillations or positive waves were seen. The lumbosacral paraspinal muscles were tested at 3 levels, and revealed no abnormalities of insertional activity at all 3 levels tested. There was good relaxation. 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  EMG of the left lower extremity shows chronic stable neuropathic denervation primarily  in the S1 nerve root distribution.  There is no clear evidence of an overlying myopathic disorder or evidence of active neuropathic denervation.  An anterior horn cell disease process cannot be confirmed by the study. Jill Alexanders MD 05/15/2019 3:12 PM [facility] [address] [place], [place] [address] Phone [phone] Fax [phone]

## 2019-06-08 ENCOUNTER — Other Ambulatory Visit: Payer: Self-pay | Admitting: Orthopedic Surgery

## 2019-06-08 ENCOUNTER — Other Ambulatory Visit (HOSPITAL_COMMUNITY): Payer: Self-pay | Admitting: Orthopedic Surgery

## 2019-06-08 DIAGNOSIS — M546 Pain in thoracic spine: Secondary | ICD-10-CM

## 2019-06-08 DIAGNOSIS — M545 Low back pain, unspecified: Secondary | ICD-10-CM

## 2019-06-16 ENCOUNTER — Ambulatory Visit: Payer: Medicare FFS | Admitting: Sports Medicine

## 2019-06-20 ENCOUNTER — Ambulatory Visit: Payer: Medicare FFS | Admitting: Sports Medicine

## 2019-06-30 ENCOUNTER — Ambulatory Visit (HOSPITAL_COMMUNITY)
Admission: RE | Admit: 2019-06-30 | Discharge: 2019-06-30 | Disposition: A | Discharge status: Home / Self Care | Payer: Medicare FFS | Source: Ambulatory Visit | Attending: Orthopedic Surgery | Admitting: Orthopedic Surgery

## 2019-06-30 ENCOUNTER — Other Ambulatory Visit: Payer: Self-pay

## 2019-06-30 DIAGNOSIS — M545 Low back pain, unspecified: Secondary | ICD-10-CM

## 2019-06-30 DIAGNOSIS — M546 Pain in thoracic spine: Secondary | ICD-10-CM | POA: Insufficient documentation

## 2019-07-11 ENCOUNTER — Other Ambulatory Visit: Payer: Self-pay

## 2019-07-11 ENCOUNTER — Other Ambulatory Visit: Payer: Self-pay | Admitting: Sports Medicine

## 2019-07-11 DIAGNOSIS — F5101 Primary insomnia: Secondary | ICD-10-CM

## 2019-07-11 DIAGNOSIS — I10 Essential (primary) hypertension: Secondary | ICD-10-CM

## 2019-07-11 MED ORDER — TRAZODONE HCL 100 MG PO TABS: 100.0000 mg | ORAL_TABLET | Freq: Every day | ORAL | 0 refills | Status: DC

## 2019-07-18 ENCOUNTER — Other Ambulatory Visit: Payer: Self-pay | Admitting: Sports Medicine

## 2019-07-18 DIAGNOSIS — R600 Localized edema: Secondary | ICD-10-CM

## 2019-07-18 DIAGNOSIS — R601 Generalized edema: Secondary | ICD-10-CM

## 2019-07-18 MED ORDER — TORSEMIDE 10 MG PO TABS: 10.0000 mg | ORAL_TABLET | Freq: Every day | ORAL | 3 refills | Status: DC

## 2019-07-25 ENCOUNTER — Encounter (INDEPENDENT_AMBULATORY_CARE_PROVIDER_SITE_OTHER): Payer: Self-pay

## 2019-08-11 ENCOUNTER — Encounter (INDEPENDENT_AMBULATORY_CARE_PROVIDER_SITE_OTHER): Payer: Medicare FFS

## 2019-08-11 DIAGNOSIS — L03032 Cellulitis of left toe: Secondary | ICD-10-CM

## 2019-08-11 DIAGNOSIS — L089 Local infection of the skin and subcutaneous tissue, unspecified: Secondary | ICD-10-CM | POA: Diagnosis not present

## 2019-08-11 MED ORDER — DOXYCYCLINE HYCLATE 100 MG PO TABS: 100.0000 mg | ORAL_TABLET | Freq: Two times a day (BID) | ORAL | 0 refills | Status: AC

## 2019-08-11 MED ORDER — CIPROFLOXACIN HCL 750 MG PO TABS: 750.0000 mg | ORAL_TABLET | Freq: Two times a day (BID) | ORAL | 0 refills | Status: AC

## 2019-08-11 MED ORDER — METRONIDAZOLE 500 MG PO TABS: 500.0000 mg | ORAL_TABLET | Freq: Two times a day (BID) | ORAL | 0 refills | Status: AC

## 2019-08-11 MED ORDER — ALPRAZOLAM 0.25 MG PO TABS
0.2500 mg | ORAL_TABLET | Freq: Three times a day (TID) | ORAL | 0 refills | Status: DC | PRN
Start: 2019-08-11 — End: 2020-10-16

## 2019-08-11 NOTE — Addendum Note (Signed)
Addended by: Monica Becton on: 08/11/2019 05:07 PM   Modules accepted: Orders

## 2019-08-29 ENCOUNTER — Ambulatory Visit: Payer: Medicare FFS | Admitting: Sports Medicine

## 2019-08-31 ENCOUNTER — Other Ambulatory Visit: Payer: Self-pay | Admitting: Sports Medicine

## 2019-08-31 DIAGNOSIS — M109 Gout, unspecified: Secondary | ICD-10-CM

## 2019-08-31 DIAGNOSIS — M1A0720 Idiopathic chronic gout, left ankle and foot, without tophus (tophi): Secondary | ICD-10-CM

## 2019-08-31 DIAGNOSIS — E1165 Type 2 diabetes mellitus with hyperglycemia: Secondary | ICD-10-CM

## 2019-09-26 ENCOUNTER — Ambulatory Visit (INDEPENDENT_AMBULATORY_CARE_PROVIDER_SITE_OTHER): Payer: Medicare FFS | Admitting: Sports Medicine

## 2019-09-26 ENCOUNTER — Other Ambulatory Visit: Payer: Self-pay | Admitting: *Deleted

## 2019-09-26 ENCOUNTER — Encounter: Payer: Self-pay | Admitting: Sports Medicine

## 2019-09-26 VITALS — BP 144/84 | HR 65 | Ht 69.0 in | Wt 286.0 lb

## 2019-09-26 DIAGNOSIS — E876 Hypokalemia: Secondary | ICD-10-CM

## 2019-09-26 DIAGNOSIS — I739 Peripheral vascular disease, unspecified: Secondary | ICD-10-CM | POA: Diagnosis not present

## 2019-09-26 DIAGNOSIS — E669 Obesity, unspecified: Secondary | ICD-10-CM | POA: Diagnosis not present

## 2019-09-26 DIAGNOSIS — E1165 Type 2 diabetes mellitus with hyperglycemia: Secondary | ICD-10-CM | POA: Diagnosis not present

## 2019-09-26 DIAGNOSIS — E1169 Type 2 diabetes mellitus with other specified complication: Secondary | ICD-10-CM | POA: Diagnosis not present

## 2019-09-26 DIAGNOSIS — L819 Disorder of pigmentation, unspecified: Secondary | ICD-10-CM | POA: Insufficient documentation

## 2019-09-26 LAB — POCT GLYCOSYLATED HEMOGLOBIN (HGB A1C): Hemoglobin A1C: 5.2 % (ref 4.0–5.6)

## 2019-09-26 MED ORDER — SPIRONOLACTONE 25 MG PO TABS: 25.0000 mg | ORAL_TABLET | Freq: Every day | ORAL | 1 refills | Status: DC

## 2019-09-26 MED ORDER — METFORMIN HCL 500 MG PO TABS: 500.0000 mg | ORAL_TABLET | Freq: Every day | ORAL | 3 refills | Status: DC

## 2019-09-26 NOTE — Progress Notes (Signed)
    Procedures performed today:    None.  Independent interpretation of notes and tests performed by another provider:   None.  Brief History, Exam, Impression, and Recommendations:    Diabetes mellitus type 2 in obese (HCC) Hemoglobin A1c is down to 5.2%, switching Metformin from 1000 twice daily to 500 daily.  Peripheral arterial disease (HCC) Hairless legs, possible blue toe syndrome, I would like him to go ahead and have ABIs and a CT angiography of the lower extremities.    ___________________________________________ [name]. [name], M.D., ABFM., CAQSM. Primary Care and Sports Medicine [facility]  Adjunct Instructor of Family Medicine  [employer]

## 2019-09-26 NOTE — Assessment & Plan Note (Signed)
Hemoglobin A1c is down to 5.2%, switching Metformin from 1000 twice daily to 500 daily.

## 2019-09-26 NOTE — Assessment & Plan Note (Signed)
Hairless legs, possible blue toe syndrome, I would like him to go ahead and have ABIs and a CT angiography of the lower extremities.

## 2019-09-27 LAB — MICROALBUMIN / CREATININE URINE RATIO
Creatinine, Urine: 62 mg/dL (ref 20–320)
Microalb Creat Ratio: 3 mcg/mg creat (ref <30)
Microalb, Ur: 0.2 mg/dL

## 2019-09-27 LAB — CBC
HCT: 46 % (ref 38.5–50.0)
Hemoglobin: 15.6 g/dL (ref 13.2–17.1)
MCH: 32 pg (ref 27.0–33.0)
MCHC: 33.9 g/dL (ref 32.0–36.0)
MCV: 94.5 fL (ref 80.0–100.0)
MPV: 10.4 fL (ref 7.5–12.5)
Platelets: 151 10*3/uL (ref 140–400)
RBC: 4.87 10*6/uL (ref 4.20–5.80)
RDW: 13.4 % (ref 11.0–15.0)
WBC: 10.4 10*3/uL (ref 3.8–10.8)

## 2019-09-27 LAB — LIPID PANEL W/REFLEX DIRECT LDL
Cholesterol: 105 mg/dL (ref <200)
HDL: 37 mg/dL — ABNORMAL LOW (ref >=40)
LDL Cholesterol (Calc): 47 mg/dL (calc)
Non-HDL Cholesterol (Calc): 68 mg/dL (calc) (ref <130)
Total CHOL/HDL Ratio: 2.8 (calc) (ref <5.0)
Triglycerides: 129 mg/dL (ref <150)

## 2019-09-27 LAB — COMPLETE METABOLIC PANEL WITH GFR
AG Ratio: 2.3 (calc) (ref 1.0–2.5)
ALT: 16 U/L (ref 9–46)
AST: 17 U/L (ref 10–35)
Albumin: 4.5 g/dL (ref 3.6–5.1)
Alkaline phosphatase (APISO): 41 U/L (ref 35–144)
BUN/Creatinine Ratio: 14 (calc) (ref 6–22)
BUN: 18 mg/dL (ref 7–25)
CO2: 28 mmol/L (ref 20–32)
Calcium: 9.5 mg/dL (ref 8.6–10.3)
Chloride: 102 mmol/L (ref 98–110)
Creat: 1.27 mg/dL — ABNORMAL HIGH (ref 0.70–1.11)
GFR, Est African American: 61 mL/min/{1.73_m2} (ref >=60)
GFR, Est Non African American: 52 mL/min/{1.73_m2} — ABNORMAL LOW (ref >=60)
Globulin: 2 g/dL (calc) (ref 1.9–3.7)
Glucose, Bld: 95 mg/dL (ref 65–99)
Potassium: 4.9 mmol/L (ref 3.5–5.3)
Sodium: 140 mmol/L (ref 135–146)
Total Bilirubin: 0.8 mg/dL (ref 0.2–1.2)
Total Protein: 6.5 g/dL (ref 6.1–8.1)

## 2019-09-27 LAB — TSH: TSH: 2.38 mIU/L (ref 0.40–4.50)

## 2019-10-02 ENCOUNTER — Other Ambulatory Visit: Payer: Self-pay

## 2019-10-02 ENCOUNTER — Ambulatory Visit (INDEPENDENT_AMBULATORY_CARE_PROVIDER_SITE_OTHER): Payer: Medicare FFS

## 2019-10-02 DIAGNOSIS — I739 Peripheral vascular disease, unspecified: Secondary | ICD-10-CM | POA: Diagnosis not present

## 2019-10-02 MED ORDER — IOHEXOL 350 MG/ML SOLN
125.0000 mL | Freq: Once | INTRAVENOUS | Status: AC | PRN
  Administered 2019-10-02: 125 mL via INTRAVENOUS

## 2019-10-04 ENCOUNTER — Ambulatory Visit (HOSPITAL_COMMUNITY): Admission: RE | Admit: 2019-10-04 | Payer: Medicare FFS | Source: Ambulatory Visit

## 2019-10-11 DIAGNOSIS — L03032 Cellulitis of left toe: Secondary | ICD-10-CM

## 2019-10-19 ENCOUNTER — Ambulatory Visit (INDEPENDENT_AMBULATORY_CARE_PROVIDER_SITE_OTHER): Payer: Medicare FFS

## 2019-10-19 ENCOUNTER — Other Ambulatory Visit: Payer: Self-pay

## 2019-10-19 ENCOUNTER — Ambulatory Visit: Payer: Medicare FFS | Admitting: Podiatry

## 2019-10-19 DIAGNOSIS — M79671 Pain in right foot: Secondary | ICD-10-CM | POA: Diagnosis not present

## 2019-10-19 DIAGNOSIS — M79675 Pain in left toe(s): Secondary | ICD-10-CM

## 2019-10-19 DIAGNOSIS — I739 Peripheral vascular disease, unspecified: Secondary | ICD-10-CM

## 2019-10-19 DIAGNOSIS — L819 Disorder of pigmentation, unspecified: Secondary | ICD-10-CM

## 2019-10-22 NOTE — Progress Notes (Signed)
Subjective:   Patient ID: Steven Patton, male   DOB: 84 y.o.   MRN: [id number]   HPI 84 year old male presents the office today with family member for concerns of his big toes turning purple.  They have noticed the discoloration last 2 to 3 weeks.  He has no pain but he does have neuropathy.  He previously seen his primary care physician and CT angiogram was performed which did not show any significant obstruction.  They also showed me pictures of the toes at first and empirically improving compared to a couple weeks ago for started.  He denies any chest pain or shortness of breath.  No COVID exposure for symptoms.  He is in a wheelchair and does not walk except for transfers.   Review of Systems  All other systems reviewed and are negative.  Past Medical History:  Diagnosis Date  . Arthritis   . Cataract   . Diabetes mellitus without complication ([place])   . Hypertension    takes Amlodipine,Lisinopril,and Metoprolol daily  . Lower extremity weakness    Bilateral  . Macular degeneration, left eye   . Sleep apnea    2008.Marland KitchenMarland KitchenWEARS CPAP    Past Surgical History:  Procedure Laterality Date  . BACK SURGERY    . CARDIAC CATHETERIZATION  06-10-12  . COLONOSCOPY    . EYE SURGERY     CATARACT RIGHT EYE  . KNEE SURGERY    . LUMBAR LAMINECTOMY/DECOMPRESSION MICRODISCECTOMY N/A 11/02/2012   Procedure: LUMBAR LAMINECTOMY/DECOMPRESSION MICRODISCECTOMY 2 LEVEL;  Surgeon: Sinclair Ship, MD;  Location: [facility];  Service: Orthopedics;  Laterality: N/A;  Lumbar 4-5,lumbar 5-sacrum 1 decompression  . LUMBAR LAMINECTOMY/DECOMPRESSION MICRODISCECTOMY N/A 09/08/2017   Procedure: THORACIC 11-12 DECOMPRESSION;  Surgeon: Phylliss Bob, MD;  Location: [facility];  Service: Orthopedics;  Laterality: N/A;  . TONSILLECTOMY    . TOTAL HIP ARTHROPLASTY Left 01/06/2013   Procedure: LEFT TOTAL HIP ARTHROPLASTY-Posterior approach;  Surgeon: Alta Corning, MD;  Location: [facility] [place];  Service: Orthopedics;  Laterality: Left;   . TOTAL HIP ARTHROPLASTY Right 04/21/2013   Procedure: TOTAL HIP ARTHROPLASTY ANTERIOR APPROACH;  Surgeon: Alta Corning, MD;  Location: [place];  Service: Orthopedics;  Laterality: Right;  . TOTAL KNEE ARTHROPLASTY Left 09/16/2012   Procedure: TOTAL KNEE ARTHROPLASTY;  Surgeon: Alta Corning, MD;  Location: [place] [place];  Service: Orthopedics;  Laterality: Left;     Current Outpatient Medications:  .  allopurinol (ZYLOPRIM) 300 MG tablet, TAKE 1 TABLET TWICE DAILY, Disp: 180 tablet, Rfl: 1 .  ALPRAZolam (XANAX) 0.25 MG tablet, Take 1 tablet (0.25 mg total) by mouth 3 (three) times daily as needed for anxiety., Disp: 30 tablet, Rfl: 0 .  AMBULATORY NON FORMULARY MEDICATION, Knee-high, firm compression, custom made, graduated compression stockings. Apply to lower extremities., Disp: 1 each, Rfl: prn .  atorvastatin (LIPITOR) 40 MG tablet, TAKE 1 TABLET EVERY DAY  AT  6PM, Disp: 90 tablet, Rfl: 3 .  blood glucose meter kit and supplies KIT, Dispense based on patient and insurance preference. Use up to four times daily as directed. (FOR ICD-9 250.00, 250.01)., Disp: 1 each, Rfl: 0 .  carvedilol (COREG) 25 MG tablet, TAKE 1 TABLET TWICE DAILY WITH A MEAL, Disp: 180 tablet, Rfl: 3 .  gabapentin (NEURONTIN) 600 MG tablet, TAKE 1 TABLET THREE TIMES DAILY, Disp: 270 tablet, Rfl: 3 .  meloxicam (MOBIC) 15 MG tablet, TAKE 1 TABLET EVERY DAY, Disp: 90 tablet, Rfl: 3 .  metFORMIN (GLUCOPHAGE) 500 MG tablet, Take 1  tablet (500 mg total) by mouth daily with breakfast., Disp: 90 tablet, Rfl: 3 .  nitroGLYCERIN (NITROSTAT) 0.4 MG SL tablet, Place 1 tablet (0.4 mg total) under the tongue every 5 (five) minutes as needed for chest pain (or tightness)., Disp: 30 tablet, Rfl: 0 .  spironolactone (ALDACTONE) 25 MG tablet, Take 1 tablet (25 mg total) by mouth daily., Disp: 90 tablet, Rfl: 1 .  torsemide (DEMADEX) 10 MG tablet, Take 1 tablet (10 mg total) by mouth daily., Disp: 30 tablet, Rfl: 3  No Known Allergies        Objective:  Physical Exam  General: AAO x3, NAD  Dermatological: Purple discoloration present distal aspects of bilateral hallux with the right side worse than left there is no skin breakdown.  Vascular: Dorsalis Pedis artery and Posterior Tibial artery pedal pulses are palpable bilateral however with delayed capillary fill time to the hallux.  There is no pain with calf compression, swelling, warmth, erythema.   Neruologic: Absent sensation with Semmes Weinstein monofilament  Musculoskeletal: No gross boney pedal deformities bilateral. No pain, crepitus, or limitation noted with foot and ankle range of motion bilateral. Muscular strength 5/5 in all groups tested bilateral.     Assessment:   Right hallux toe discoloration    Plan:  -Treatment options discussed including all alternatives, risks, and complications -Etiology of symptoms were discussed -X-rays were obtained and reviewed with the patient.  Not able to appreciate any evidence of acute fracture. -Discussed the purple discoloration could be coming from numerous issues.  Given his neuropathy and he likely has microvascular disease that can be contributing to the purple discoloration.  It seems to be improving compared to the pictures.  I dispensed offloading shoe for the right side to avoid any pressure.  Monitor for any skin breakdown.  Encourage elevation.    Trula Slade DPM

## 2019-11-13 ENCOUNTER — Encounter: Payer: Self-pay | Admitting: Sports Medicine

## 2019-11-13 ENCOUNTER — Other Ambulatory Visit: Payer: Self-pay

## 2019-11-13 ENCOUNTER — Ambulatory Visit: Payer: Medicare FFS | Admitting: Sports Medicine

## 2019-11-13 VITALS — BP 146/72 | HR 62 | Ht 69.0 in | Wt 296.0 lb

## 2019-11-13 DIAGNOSIS — Z23 Encounter for immunization: Secondary | ICD-10-CM | POA: Diagnosis not present

## 2019-11-13 DIAGNOSIS — L819 Disorder of pigmentation, unspecified: Secondary | ICD-10-CM | POA: Diagnosis not present

## 2019-11-13 NOTE — Progress Notes (Signed)
    Procedures performed today:    None.  Independent interpretation of notes and tests performed by another provider:   None.  Brief History, Exam, Impression, and Recommendations:    Discoloration of skin of foot Steven Patton is a pleasant 84 year old male, at the last visit he was having some mottling and discoloration of both feet, toes, we were worried about blue toe syndrome so we proceeded with ABIs and CT angiography aortobifemoral with bilateral lower extremity runoff. These look good, no sites of stent double stenosis, ABI was canceled. Ultimately he was not having any calf cramping either, symptoms improved considerably, mottling is improving considerably, we did get a second opinion from podiatry, nothing specific needed to be done from their perspective. Today he continues to have improving ruddy appearance and mottling of his toes and feet, nontender, this seems to improve with warming his toes. He will continue to wear thick socks, shoes, and elevate his feet. I will think any further intervention is needed at this juncture however if this happens again we could certainly consider cryoglobulinemia as an etiology. This would be ordered as a cryoglobulin screen, [id number] should he have recurrence of symptoms.    ___________________________________________ Steven Patton, M.D., ABFM., CAQSM. Primary Care and Sports Medicine [facility]  Adjunct Instructor of Family Medicine  [employer]

## 2019-11-13 NOTE — Assessment & Plan Note (Signed)
Lenard Galloway is a pleasant 84 year old male, at the last visit he was having some mottling and discoloration of both feet, toes, we were worried about blue toe syndrome so we proceeded with ABIs and CT angiography aortobifemoral with bilateral lower extremity runoff. These look good, no sites of stent double stenosis, ABI was canceled. Ultimately he was not having any calf cramping either, symptoms improved considerably, mottling is improving considerably, we did get a second opinion from podiatry, nothing specific needed to be done from their perspective. Today he continues to have improving ruddy appearance and mottling of his toes and feet, nontender, this seems to improve with warming his toes. He will continue to wear thick socks, shoes, and elevate his feet. I will think any further intervention is needed at this juncture however if this happens again we could certainly consider cryoglobulinemia as an etiology. This would be ordered as a cryoglobulin screen, [id number] should he have recurrence of symptoms.

## 2019-11-24 ENCOUNTER — Ambulatory Visit: Payer: Medicare FFS | Admitting: Podiatry

## 2019-11-28 ENCOUNTER — Other Ambulatory Visit: Payer: Self-pay | Admitting: Sports Medicine

## 2019-12-07 ENCOUNTER — Inpatient Hospital Stay: Payer: Medicare FFS

## 2019-12-07 ENCOUNTER — Inpatient Hospital Stay: Payer: Medicare FFS | Admitting: Family

## 2019-12-25 DIAGNOSIS — M48062 Spinal stenosis, lumbar region with neurogenic claudication: Secondary | ICD-10-CM

## 2019-12-26 MED ORDER — AMBULATORY NON FORMULARY MEDICATION: 0 refills | Status: AC

## 2019-12-26 NOTE — Telephone Encounter (Signed)
Done

## 2020-01-01 ENCOUNTER — Telehealth: Payer: Self-pay

## 2020-01-01 NOTE — Telephone Encounter (Signed)
Call from [employer] stating that the wheelchair order they received is a specialty item and will take 4-6 weeks to get in.    Call made to patient's family.  They are going to make contact with insurance company and relay this information and see if there is another DME company we can use.    The family will let us know if another DME company is approved by insurance and we will proceed from there.

## 2020-01-03 NOTE — Telephone Encounter (Signed)
Received another call from [facility] [employer] stating that the wheelchair is on backorder with no estimated time for possible delivery.    Spoke with daughter, she is going to speak with insurance company and let us know what other DME company we can use.

## 2020-01-08 ENCOUNTER — Other Ambulatory Visit: Payer: Self-pay | Admitting: Sports Medicine

## 2020-01-08 DIAGNOSIS — E876 Hypokalemia: Secondary | ICD-10-CM

## 2020-01-27 IMAGING — MR MR LUMBAR SPINE W/O CM
4 of 5 series · 24 of 48 positions shown · non-contrast
Comparison: MRI lumbar spine 07/19/2012.

CLINICAL DATA: Lumbar stenosis with neurogenic claudication. Prior
lumbar decompression. BILATERAL leg numbness and weakness.

EXAM:
MRI LUMBAR SPINE WITHOUT CONTRAST
TECHNIQUE: Multiplanar, multisequence MR imaging of the lumbar spine was
performed. No intravenous contrast was administered.

[Series 2: T1 · sagittal · 4.0mm · 0.81mm/px · 6 of 18 slices shown (1 of 2)]
[im 1/18]
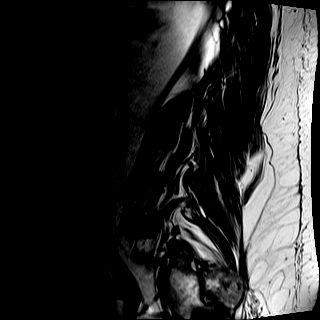
[im 4/18]
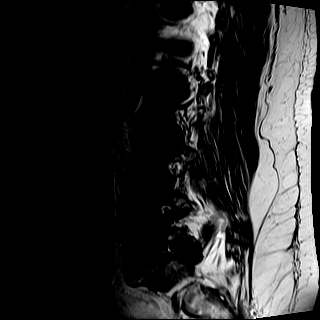
[im 7/18]
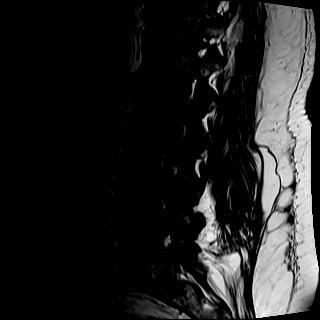
[im 11/18]
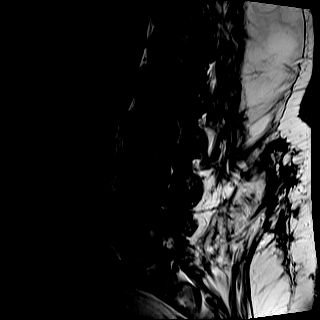
[im 14/18]
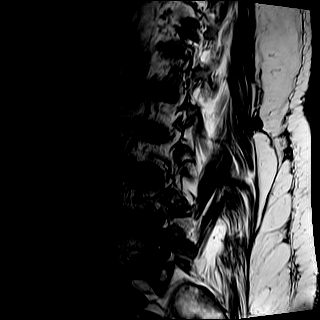
[im 18/18]
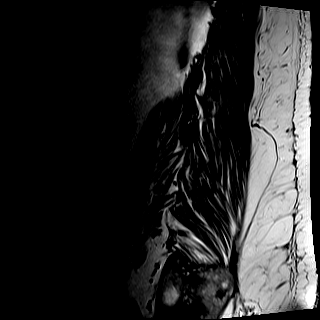

[Series 3: T2 · sagittal · 4.0mm · 0.81mm/px · 6 of 18 slices shown (1 of 2)]
[im 1/18]
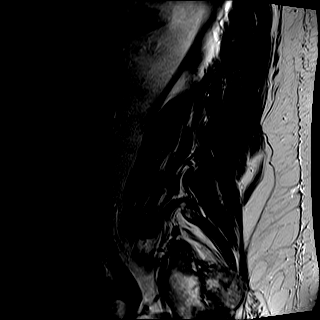
[im 4/18]
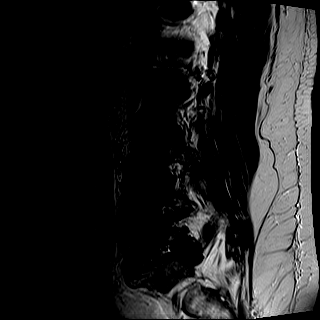
[im 7/18]
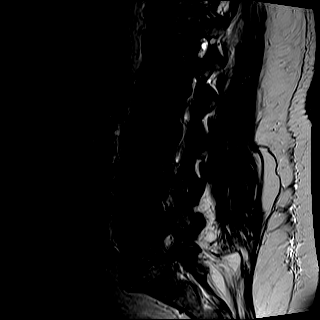
[im 11/18]
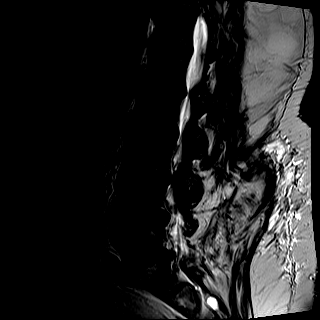
[im 14/18]
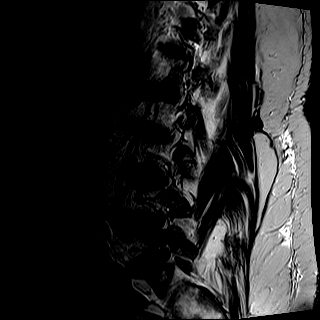
[im 18/18]
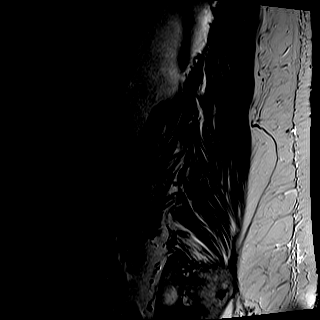

[Series 5: T2 · axial · 4.0mm · 0.39mm/px · z∈[-174,+52]mm · 9 of 42 slices shown (2 of 2)]
[im 1/42]
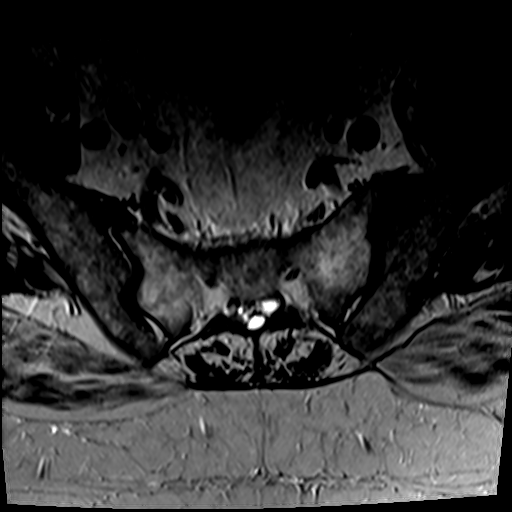
[im 6/42]
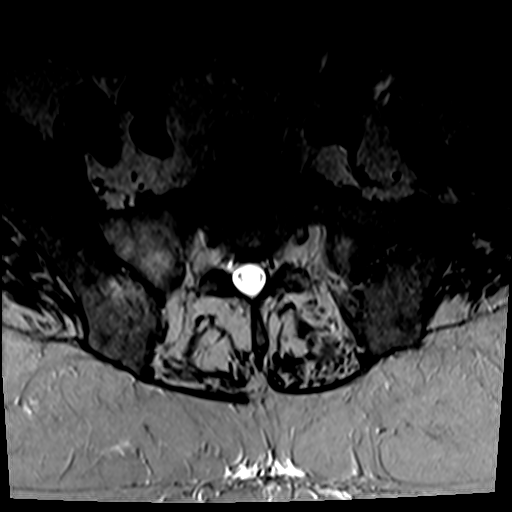
[im 12/42]
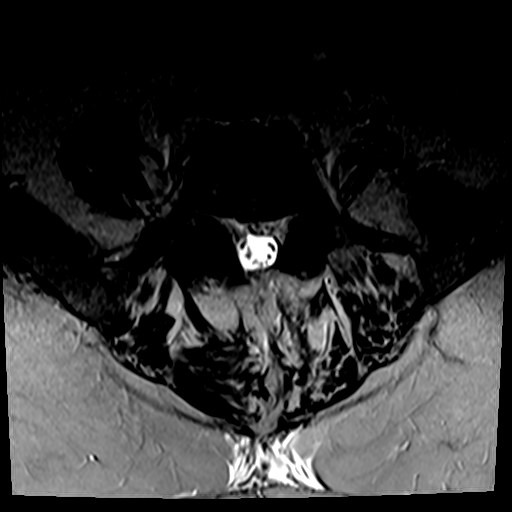
[im 18/42]
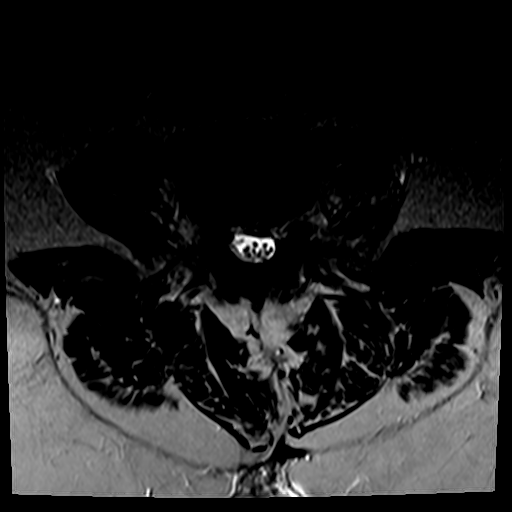
[im 21/42]
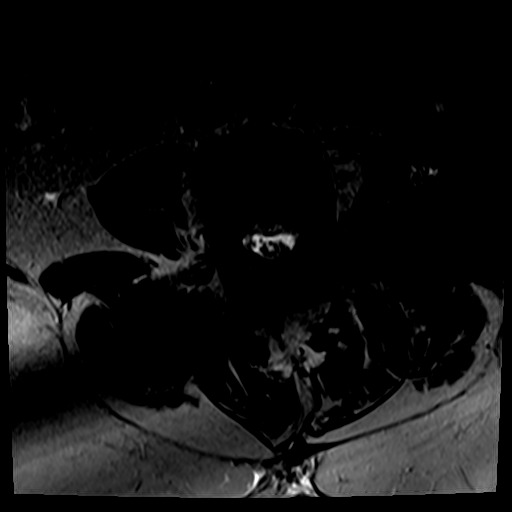
[im 24/42]
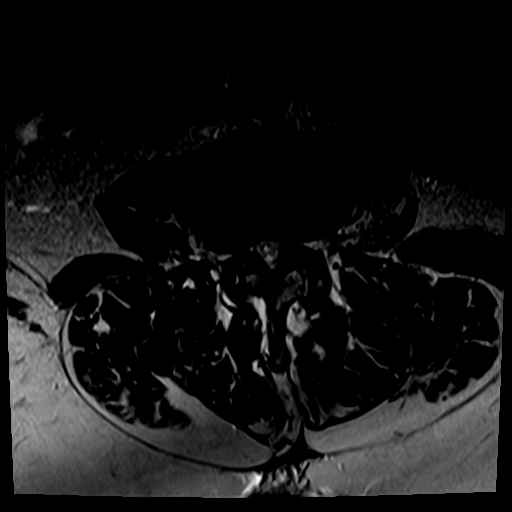
[im 30/42]
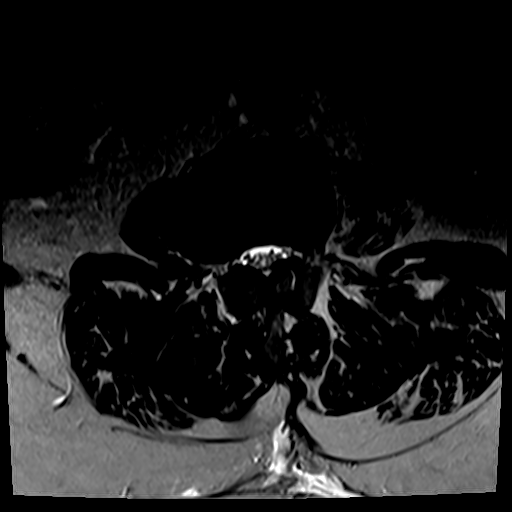
[im 36/42]
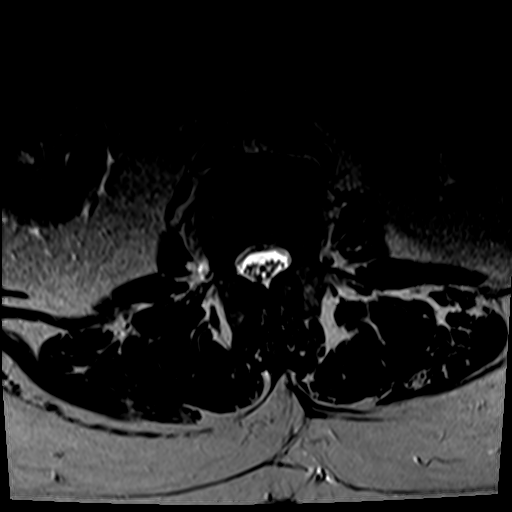
[im 42/42]
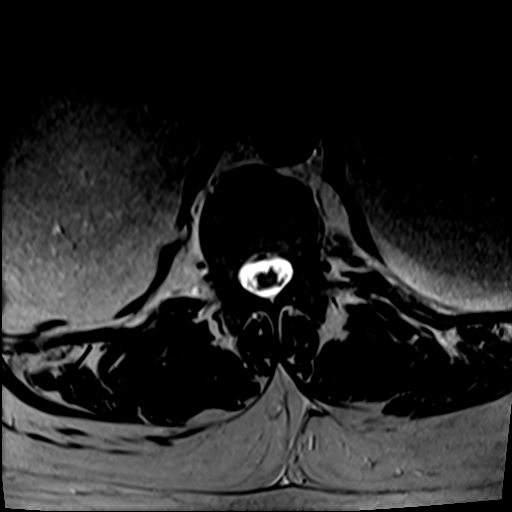

[Series 6: T1 · axial · 4.0mm · 0.39mm/px · z∈[-149,+22]mm · 3 of 42 slices shown (2 of 2)]
[im 6/42]
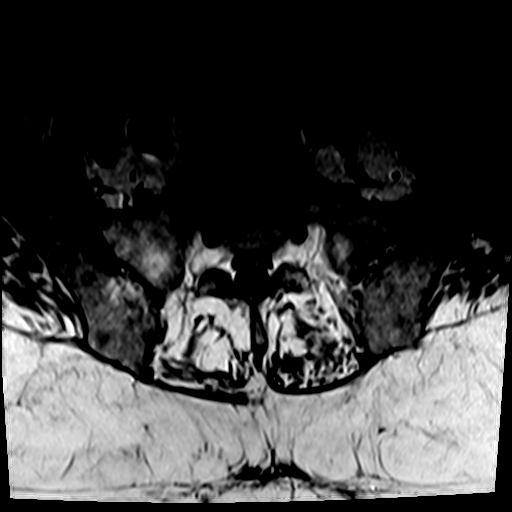
[im 21/42]
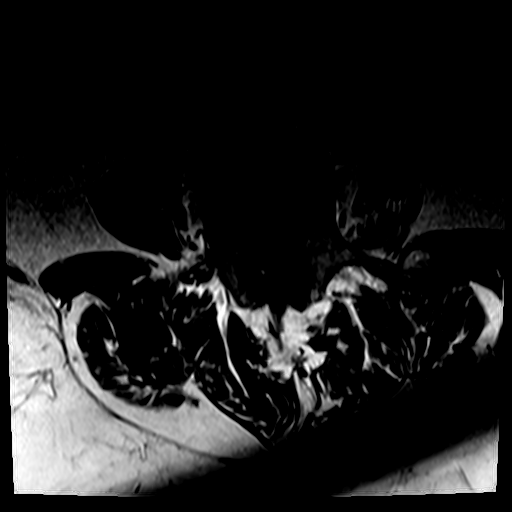
[im 36/42]
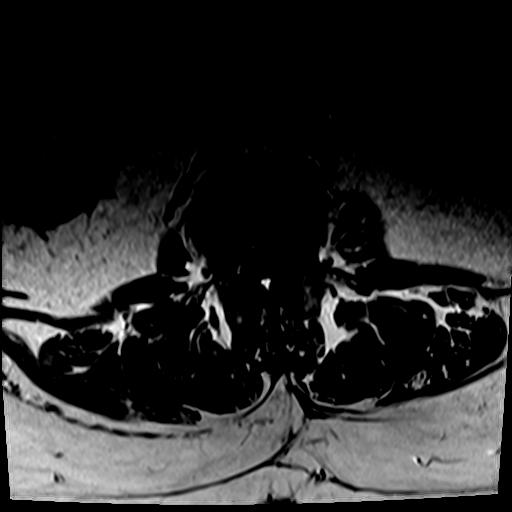

[24 of 48 positions shown; findings below may reference images not displayed]

FINDINGS: Segmentation:  Standard.

Alignment:  Anatomic.

Vertebrae: No worrisome osseous lesion. No fracture, evidence of
diskitis, or bone lesion. Congenital short pedicles.

Conus medullaris and cauda equina: Conus extends to the L2. level.
Conus appears normal. Distal thoracic cord compression described
below.

Paraspinal and other soft tissues: Increased body habitus without
features of epidural lipomatosis. No visible paravertebral mass or
hydronephrosis.

Disc levels:

Incompletely evaluated, sagittal only T11-T12: Central protrusion,
with ligamentum flavum overgrowth/infolding/calcification results in
distal thoracic cord compression and central hyperintensity. Severe
stenosis is present. See sagittal image 10 series 3 and 4. Similar
appearance to 4850.

L1-L2: Disc space narrowing. Mild congenital and acquired stenosis.
Posterior element hypertrophy. Short pedicles. Borderline L2 neural
impingement.

L2-L3: Normal disc space. Mild to moderate congenital and acquired
stenosis. Short pedicles. Posterior element hypertrophy. Borderline
LEFT L3 neural impingement.

L3-L4: Normal-appearing disc space. Severe congenital and acquired
stenosis. Posterior element hypertrophy. BILATERAL L4 neural
impingement in the canal. Far-lateral, foraminal, and extraforaminal
protrusion on the RIGHT. See axial image 19. RIGHT L3 neural
impingement is likely.

L4-L5: Central disc extrusion. Previous lumbar decompression,
apparent laminectomy, with partial regrowth of the posterior
elements, contributing to mild stenosis. Facet arthropathy. Disc
material extends to both foramina and beyond, greater on the RIGHT.
BILATERAL RIGHT greater than LEFT L4 and L5 neural impingement.
Facet arthropathy.

L5-S1: Widely patent lumbar decompression. Advanced disc space
narrowing. Central protrusion. Facet arthropathy. BILATERAL
subarticular zone and foraminal zone narrowing affect the L5 and S1
nerve roots.
IMPRESSION: Congenital and acquired stenosis throughout the visualized lumbar
disc spaces, most severe L3-L4. In addition to short pedicles and
posterior element hypertrophy at the L3-4 level, there is a
far-lateral and foraminal protrusion on the RIGHT, with RIGHT
greater than LEFT L4 and RIGHT L3 neural impingement.

Large disc extrusion at L4-5, with partial regrowth of posterior
elements at a segment previously decompressed. BILATERAL L4 and L5
neural impingement are likely.

Advanced disc space narrowing L5-S1. Wide decompression, without
residual stenosis, but subarticular zone and foraminal zone
narrowing could affect the L5 and S1 nerve roots.

Severe stenosis is present at T11-12, incompletely evaluated with
only sagittal images on this lumbar exam. Central protrusion with
ligamentum flavum infolding/hypertrophy results in cord compression
with abnormal cord signal. Thoracic spine MRI without contrast
recommended.

These results will be called to the ordering clinician or
representative by the Radiologist Assistant, and communication
documented in the PACS or zVision Dashboard.

## 2020-03-04 ENCOUNTER — Telehealth: Payer: Self-pay

## 2020-03-04 NOTE — Telephone Encounter (Signed)
Okay its clear now, [name] will need her own PCP to write the note and fill out FMLA paperwork that it generates.

## 2020-03-04 NOTE — Telephone Encounter (Signed)
Steven Patton did not like the decision to have her PCP complete the paperwork.

## 2020-03-04 NOTE — Telephone Encounter (Signed)
Darran lives in [place] with his daughter, [name], who has been diagnosed with COVID. His DIL, [name], from [place] came and got patient to keep him from getting COVID. She works in a Therapist, sports and is concerned about bringing COVID or something else home to [place]. She is requesting to have some paperwork completed to work from home to care for Gustine and decrease the chances of getting him sick. This is to be for 14-30 days depending on Jennifer's recovery. I let [name] know I would discuss this with you and get back with her.

## 2020-03-14 ENCOUNTER — Other Ambulatory Visit: Payer: Self-pay | Admitting: Sports Medicine

## 2020-03-14 DIAGNOSIS — R601 Generalized edema: Secondary | ICD-10-CM

## 2020-03-14 DIAGNOSIS — M1A0720 Idiopathic chronic gout, left ankle and foot, without tophus (tophi): Secondary | ICD-10-CM

## 2020-03-14 DIAGNOSIS — M109 Gout, unspecified: Secondary | ICD-10-CM

## 2020-03-14 DIAGNOSIS — R600 Localized edema: Secondary | ICD-10-CM

## 2020-03-16 ENCOUNTER — Other Ambulatory Visit: Payer: Self-pay

## 2020-03-16 ENCOUNTER — Encounter (HOSPITAL_COMMUNITY): Payer: Self-pay | Admitting: Emergency Medicine

## 2020-03-16 ENCOUNTER — Emergency Department (HOSPITAL_COMMUNITY): Payer: Medicare FFS

## 2020-03-16 ENCOUNTER — Emergency Department (HOSPITAL_COMMUNITY)
Admission: EM | Admit: 2020-03-16 | Discharge: 2020-03-16 | Disposition: A | Discharge status: Home / Self Care | Payer: Medicare FFS | Attending: Emergency Medicine | Admitting: Emergency Medicine

## 2020-03-16 DIAGNOSIS — R0602 Shortness of breath: Secondary | ICD-10-CM | POA: Diagnosis not present

## 2020-03-16 DIAGNOSIS — M545 Low back pain, unspecified: Secondary | ICD-10-CM

## 2020-03-16 DIAGNOSIS — Z87891 Personal history of nicotine dependence: Secondary | ICD-10-CM | POA: Diagnosis not present

## 2020-03-16 DIAGNOSIS — N1831 Chronic kidney disease, stage 3a: Secondary | ICD-10-CM | POA: Diagnosis not present

## 2020-03-16 DIAGNOSIS — Z79899 Other long term (current) drug therapy: Secondary | ICD-10-CM | POA: Diagnosis not present

## 2020-03-16 DIAGNOSIS — I129 Hypertensive chronic kidney disease with stage 1 through stage 4 chronic kidney disease, or unspecified chronic kidney disease: Secondary | ICD-10-CM | POA: Diagnosis not present

## 2020-03-16 DIAGNOSIS — R0789 Other chest pain: Secondary | ICD-10-CM | POA: Diagnosis not present

## 2020-03-16 DIAGNOSIS — Z96652 Presence of left artificial knee joint: Secondary | ICD-10-CM | POA: Diagnosis not present

## 2020-03-16 DIAGNOSIS — E1122 Type 2 diabetes mellitus with diabetic chronic kidney disease: Secondary | ICD-10-CM | POA: Diagnosis not present

## 2020-03-16 DIAGNOSIS — Z20822 Contact with and (suspected) exposure to covid-19: Secondary | ICD-10-CM | POA: Diagnosis not present

## 2020-03-16 DIAGNOSIS — Z96643 Presence of artificial hip joint, bilateral: Secondary | ICD-10-CM | POA: Insufficient documentation

## 2020-03-16 DIAGNOSIS — R079 Chest pain, unspecified: Secondary | ICD-10-CM | POA: Diagnosis present

## 2020-03-16 DIAGNOSIS — Z7984 Long term (current) use of oral hypoglycemic drugs: Secondary | ICD-10-CM | POA: Insufficient documentation

## 2020-03-16 DIAGNOSIS — N390 Urinary tract infection, site not specified: Secondary | ICD-10-CM

## 2020-03-16 LAB — TROPONIN I (HIGH SENSITIVITY)
Troponin I (High Sensitivity): 7 ng/L (ref <18)
Troponin I (High Sensitivity): 10 ng/L (ref <18)

## 2020-03-16 LAB — BASIC METABOLIC PANEL
Anion gap: 13 (ref 5–15)
BUN: 21 mg/dL (ref 8–23)
CO2: 25 mmol/L (ref 22–32)
Calcium: 9.8 mg/dL (ref 8.9–10.3)
Chloride: 100 mmol/L (ref 98–111)
Creatinine, Ser: 1.59 mg/dL — ABNORMAL HIGH (ref 0.61–1.24)
GFR, Estimated: 43 mL/min — ABNORMAL LOW (ref >60)
Glucose, Bld: 142 mg/dL — ABNORMAL HIGH (ref 70–99)
Potassium: 4.4 mmol/L (ref 3.5–5.1)
Sodium: 138 mmol/L (ref 135–145)

## 2020-03-16 LAB — RESP PANEL BY RT-PCR (FLU A&B, COVID) ARPGX2
Influenza A by PCR: NEGATIVE
Influenza B by PCR: NEGATIVE
SARS Coronavirus 2 by RT PCR: NEGATIVE

## 2020-03-16 LAB — CBC
HCT: 49.4 % (ref 39.0–52.0)
Hemoglobin: 15.6 g/dL (ref 13.0–17.0)
MCH: 31.8 pg (ref 26.0–34.0)
MCHC: 31.6 g/dL (ref 30.0–36.0)
MCV: 100.6 fL — ABNORMAL HIGH (ref 80.0–100.0)
Platelets: 132 10*3/uL — ABNORMAL LOW (ref 150–400)
RBC: 4.91 MIL/uL (ref 4.22–5.81)
RDW: 14.2 % (ref 11.5–15.5)
WBC: 16.7 10*3/uL — ABNORMAL HIGH (ref 4.0–10.5)
nRBC: 0 % (ref 0.0–0.2)

## 2020-03-16 LAB — URINALYSIS, MICROSCOPIC (REFLEX): WBC, UA: >50 WBC/hpf (ref 0–5)

## 2020-03-16 LAB — URINALYSIS, ROUTINE W REFLEX MICROSCOPIC
Bilirubin Urine: NEGATIVE
Glucose, UA: NEGATIVE mg/dL
Hgb urine dipstick: NEGATIVE
Ketones, ur: NEGATIVE mg/dL
Nitrite: NEGATIVE
Protein, ur: NEGATIVE mg/dL
Specific Gravity, Urine: 1.02 (ref 1.005–1.030)
pH: 5.5 (ref 5.0–8.0)

## 2020-03-16 LAB — HEPATIC FUNCTION PANEL
ALT: 23 U/L (ref 0–44)
AST: 18 U/L (ref 15–41)
Albumin: 3.3 g/dL — ABNORMAL LOW (ref 3.5–5.0)
Alkaline Phosphatase: 48 U/L (ref 38–126)
Bilirubin, Direct: 0.3 mg/dL — ABNORMAL HIGH (ref 0.0–0.2)
Indirect Bilirubin: 0.9 mg/dL (ref 0.3–0.9)
Total Bilirubin: 1.2 mg/dL (ref 0.3–1.2)
Total Protein: 6.3 g/dL — ABNORMAL LOW (ref 6.5–8.1)

## 2020-03-16 MED ORDER — ONDANSETRON HCL 4 MG/2ML IJ SOLN
4.0000 mg | Freq: Once | INTRAMUSCULAR | Status: AC
  Administered 2020-03-16: 4 mg via INTRAVENOUS
  Filled 2020-03-16: qty 2

## 2020-03-16 MED ORDER — CEPHALEXIN 500 MG PO CAPS: 500.0000 mg | ORAL_CAPSULE | Freq: Two times a day (BID) | ORAL | 0 refills | Status: AC

## 2020-03-16 MED ORDER — HYDROCODONE-ACETAMINOPHEN 5-325 MG PO TABS: 1.0000 | ORAL_TABLET | ORAL | 0 refills | Status: DC | PRN

## 2020-03-16 MED ORDER — CEPHALEXIN 250 MG PO CAPS
500.0000 mg | ORAL_CAPSULE | Freq: Once | ORAL | Status: AC
  Administered 2020-03-16: 500 mg via ORAL
  Filled 2020-03-16: qty 2

## 2020-03-16 MED ORDER — MORPHINE SULFATE (PF) 4 MG/ML IV SOLN
4.0000 mg | Freq: Once | INTRAVENOUS | Status: AC
  Administered 2020-03-16: 4 mg via INTRAVENOUS
  Filled 2020-03-16: qty 1

## 2020-03-16 MED ORDER — IOHEXOL 350 MG/ML SOLN
65.0000 mL | Freq: Once | INTRAVENOUS | Status: AC | PRN
  Administered 2020-03-16: 65 mL via INTRAVENOUS

## 2020-03-16 MED ORDER — OXYCODONE-ACETAMINOPHEN 5-325 MG PO TABS
2.0000 | ORAL_TABLET | Freq: Once | ORAL | Status: AC
Start: 2020-03-16 — End: 2020-03-16
  Administered 2020-03-16: 2 via ORAL
  Filled 2020-03-16: qty 2

## 2020-03-16 NOTE — ED Notes (Signed)
Lab to draw urine culture off of UA that was sent earlier.

## 2020-03-16 NOTE — ED Triage Notes (Signed)
C/o pain across back, intermittent chest pain, and SOB since Thursday.  Negative home COVID test a few days ago.

## 2020-03-16 NOTE — ED Provider Notes (Signed)
[facility] EMERGENCY DEPARTMENT Provider Note   CSN: [id number] Arrival date & time: 03/16/20  1200     History Chief Complaint  Patient presents with  . Chest Pain    Steven Patton is a 85 y.o. male.  Pt presents to the ED today with cp and sob.  The pt's daughter gives most of the hx.  Pt does not walk, but is normally able to transfer.  He has had worsening back pain for the past few days.  She said he was unable to get up by himself yesterday.  He developed sob and cp.  He describes it more as a sharp pain in his chest which causes him to be sob because of the pain.  The pt denies f/c.  He took a home Covid Ag test on 2/9 which was negative.  He has been vaccinated, but no booster.          Past Medical History:  Diagnosis Date  . Arthritis   . Cataract   . Diabetes mellitus without complication ([place])   . Hypertension    takes Amlodipine,Lisinopril,and Metoprolol daily  . Lower extremity weakness    Bilateral  . Macular degeneration, left eye   . Sleep apnea    2008.Marland KitchenMarland KitchenWEARS CPAP    Patient Active Problem List   Diagnosis Date Noted  . Discoloration of skin of foot 09/26/2019  . Frequent fasciculation of limb muscle 04/06/2019  . Cervical myelopathy with cervical radiculopathy ([place]) 04/06/2019  . Ambulatory dysfunction 04/06/2019  . Neurogenic claudication due to lumbar spinal stenosis 04/06/2019  . Bilateral leg numbness 04/06/2019  . Stage 3a chronic kidney disease ([place]) 04/06/2019  . History of progressive weakness 04/04/2019  . Onychomycosis 03/01/2019  . Paronychia, toe, left 12/14/2018  . Tinea cruris 10/07/2018  . Diabetes mellitus type 2 in obese ([place]) 12/09/2017  . Vertigo, benign paroxysmal 12/06/2017  . Hematuria 10/01/2017  . Myelopathy ([place]) 09/08/2017  . Spinal cord compression ([place]) 08/23/2017  . Insomnia 05/24/2017  . S/P cervical spinal fusion 09/28/2016  . Gout 06/17/2015  . Osteoarthritis of right hip 04/21/2013  .  Monoclonal B-cell lymphocytosis 07/27/2012  . Obesity 06/20/2012  . H/O left total hip arthroplasty 05/18/2012  . Lumbar stenosis with neurogenic claudication 01/29/2012  . Knee osteoarthritis 01/18/2012  . Hypertension 01/04/2012  . Chronic renal insufficiency 01/04/2012  . Hyperlipidemia 01/04/2012  . Annual physical exam 01/04/2012  . Sleep apnea 01/04/2012    Past Surgical History:  Procedure Laterality Date  . BACK SURGERY    . CARDIAC CATHETERIZATION  06-10-12  . COLONOSCOPY    . EYE SURGERY     CATARACT RIGHT EYE  . KNEE SURGERY    . LUMBAR LAMINECTOMY/DECOMPRESSION MICRODISCECTOMY N/A 11/02/2012   Procedure: LUMBAR LAMINECTOMY/DECOMPRESSION MICRODISCECTOMY 2 LEVEL;  Surgeon: Sinclair Ship, MD;  Location: [facility];  Service: Orthopedics;  Laterality: N/A;  Lumbar 4-5,lumbar 5-sacrum 1 decompression  . LUMBAR LAMINECTOMY/DECOMPRESSION MICRODISCECTOMY N/A 09/08/2017   Procedure: THORACIC 11-12 DECOMPRESSION;  Surgeon: Phylliss Bob, MD;  Location: [facility];  Service: Orthopedics;  Laterality: N/A;  . TONSILLECTOMY    . TOTAL HIP ARTHROPLASTY Left 01/06/2013   Procedure: LEFT TOTAL HIP ARTHROPLASTY-Posterior approach;  Surgeon: Alta Corning, MD;  Location: [facility];  Service: Orthopedics;  Laterality: Left;  . TOTAL HIP ARTHROPLASTY Right 04/21/2013   Procedure: TOTAL HIP ARTHROPLASTY ANTERIOR APPROACH;  Surgeon: Alta Corning, MD;  Location: [facility];  Service: Orthopedics;  Laterality: Right;  . TOTAL KNEE ARTHROPLASTY  Left 09/16/2012   Procedure: TOTAL KNEE ARTHROPLASTY;  Surgeon: Alta Corning, MD;  Location: [facility];  Service: Orthopedics;  Laterality: Left;       Family History  Problem Relation Age of Onset  . Cancer Daughter   . Alzheimer's disease Mother   . Lung cancer Father     Social History   Tobacco Use  . Smoking status: Former Smoker    Packs/day: 1.00    Years: 20.00    Pack years: 20.00    Types: Cigarettes    Quit date: 01/04/1993    Years since quitting:  27.2  . Smokeless tobacco: Former Systems developer    Types: Chew    Quit date: 01/04/1993  . Tobacco comment: quit 57yr ago  Vaping Use  . Vaping Use: Never used  Substance Use Topics  . Alcohol use: No  . Drug use: No    Home Medications Prior to Admission medications   Medication Sig Start Date End Date Taking? Authorizing Provider  HYDROcodone-acetaminophen (NORCO/VICODIN) 5-325 MG tablet Take 1 tablet by mouth every 4 (four) hours as needed. 03/16/20  Yes HIsla Pence MD  allopurinol (ZYLOPRIM) 300 MG tablet TAKE 1 TABLET TWICE DAILY 03/14/20   TSilverio Decamp MD  ALPRAZolam (Duanne Moron 0.25 MG tablet Take 1 tablet (0.25 mg total) by mouth 3 (three) times daily as needed for anxiety. 08/11/19   TSilverio Decamp MD  AMBULATORY NON FORMULARY MEDICATION Knee-high, firm compression, custom made, graduated compression stockings. Apply to lower extremities. 10/26/16   TSilverio Decamp MD  AMBULATORY NON FORMULARY MEDICATION Light weight wheel chair. 12/26/19   TSilverio Decamp MD  atorvastatin (LIPITOR) 40 MG tablet TAKE 1 TABLET EVERY DAY  AT  6PM 07/11/19   TSilverio Decamp MD  blood glucose meter kit and supplies KIT Dispense based on patient and insurance preference. Use up to four times daily as directed. (FOR ICD-9 250.00, 250.01). 12/14/17   TSilverio Decamp MD  carvedilol (COREG) 25 MG tablet TAKE 1 TABLET TWICE DAILY WITH A MEAL 08/31/19   TSilverio Decamp MD  gabapentin (NEURONTIN) 600 MG tablet TAKE 1 TABLET THREE TIMES DAILY 01/09/20   TSilverio Decamp MD  meloxicam (MOBIC) 15 MG tablet TAKE 1 TABLET EVERY DAY 11/28/19   TSilverio Decamp MD  metFORMIN (GLUCOPHAGE) 500 MG tablet Take 1 tablet (500 mg total) by mouth daily with breakfast. 09/26/19   TSilverio Decamp MD  nitroGLYCERIN (NITROSTAT) 0.4 MG SL tablet Place 1 tablet (0.4 mg total) under the tongue every 5 (five) minutes as needed for chest pain (or tightness). 05/24/17    TSilverio Decamp MD  spironolactone (ALDACTONE) 25 MG tablet TAKE 1 TABLET EVERY DAY 01/09/20   TSilverio Decamp MD  torsemide (DEMADEX) 10 MG tablet Take 1 tablet (10 mg total) by mouth daily. 07/18/19   TSilverio Decamp MD  torsemide (DEMADEX) 20 MG tablet TAKE 1 TABLET EVERY DAY 03/14/20   TSilverio Decamp MD    Allergies    Patient has no known allergies.  Review of Systems   Review of Systems  Respiratory: Positive for shortness of breath.   Cardiovascular: Positive for chest pain.  Musculoskeletal: Positive for back pain.  All other systems reviewed and are negative.   Physical Exam Updated Vital Signs BP (!) 115/52   Pulse 84   Temp 97.6 F (36.4 C) (Oral)   Resp (!) 21   SpO2 96%   Physical Exam Vitals and nursing  note reviewed.  Constitutional:      Appearance: He is well-developed. He is obese.  HENT:     Head: Normocephalic and atraumatic.  Eyes:     Extraocular Movements: Extraocular movements intact.     Pupils: Pupils are equal, round, and reactive to light.  Cardiovascular:     Rate and Rhythm: Normal rate and regular rhythm.     Heart sounds: Normal heart sounds.  Pulmonary:     Effort: Pulmonary effort is normal.     Breath sounds: Normal breath sounds.  Chest:    Abdominal:     General: Bowel sounds are normal.     Palpations: Abdomen is soft.  Musculoskeletal:        General: Normal range of motion.     Cervical back: Normal range of motion and neck supple.     Comments: Tenderness throughout the back.  It took 2 of [place] to sit him up in bed so I could see his back.  Skin:    General: Skin is warm.     Capillary Refill: Capillary refill takes less than 2 seconds.  Neurological:     General: No focal deficit present.     Mental Status: He is alert and oriented to person, place, and time.     ED Results / Procedures / Treatments   Labs (all labs ordered are listed, but only abnormal results are displayed) Labs  Reviewed  BASIC METABOLIC PANEL - Abnormal; Notable for the following components:      Result Value   Glucose, Bld 142 (*)    Creatinine, Ser 1.59 (*)    GFR, Estimated 43 (*)    All other components within normal limits  CBC - Abnormal; Notable for the following components:   WBC 16.7 (*)    MCV 100.6 (*)    Platelets 132 (*)    All other components within normal limits  RESP PANEL BY RT-PCR (FLU A&B, COVID) [id number]  HEPATIC FUNCTION PANEL  TROPONIN I (HIGH SENSITIVITY)  TROPONIN I (HIGH SENSITIVITY)    EKG EKG Interpretation  Date/Time:  Saturday March 16 2020 12:12:56 EST Ventricular Rate:  65 PR Interval:  216 QRS Duration: 72 QT Interval:  340 QTC Calculation: 353 R Axis:   -66 Text Interpretation: Sinus rhythm with 1st degree A-V block with Premature supraventricular complexes Left axis deviation Low voltage QRS Cannot rule out Anterior infarct , age undetermined Abnormal ECG No old tracing to compare Confirmed by Isla Pence [phone]) on 03/16/2020 12:33:20 PM   Radiology DG Chest 2 View  Result Date: 03/16/2020 CLINICAL DATA:  Chest pain and shortness of breath EXAM: CHEST - 2 VIEW COMPARISON:  September 03, 2017 FINDINGS: There is atelectasis in the left base. The lungs elsewhere are clear. There is cardiomegaly with pulmonary vascularity normal. No adenopathy. There is aortic atherosclerosis. There is postoperative change in the lower cervical region. IMPRESSION: Left base atelectasis. No edema or airspace opacity. There is cardiomegaly. Aortic Atherosclerosis (ICD10-I70.0). Electronically Signed   By: Lowella Grip III M.D.   On: 03/16/2020 13:50   CT Angio Chest/Abd/Pel for Dissection W and/or Wo Contrast  Result Date: 03/16/2020 CLINICAL DATA:  Chest and abdominal pain EXAM: CT ANGIOGRAPHY CHEST, ABDOMEN AND PELVIS TECHNIQUE: Non-contrast CT of the chest was initially obtained. Multidetector CT imaging through the chest, abdomen and pelvis was performed using the  standard protocol during bolus administration of intravenous contrast. Multiplanar reconstructed images and MIPs were obtained and reviewed to evaluate the vascular  anatomy. CONTRAST:  68m OMNIPAQUE IOHEXOL 350 MG/ML SOLN COMPARISON:  CT abdomen and pelvis May 05, 2019; CT angiogram abdomen and pelvis with runoffs October 02, 2019 FINDINGS: CTA CHEST FINDINGS Cardiovascular: No intramural hematoma is evident on noncontrast enhanced study of the thoracic aorta. There is no appreciable thoracic aortic aneurysm or dissection. Visualized great vessels appear patent. No aneurysm or dissection involving these vessels evident. There is no evident mediastinal hematoma. There is aortic atherosclerosis. There are foci of coronary artery calcification. There is no demonstrable pulmonary embolus. Mediastinum/Nodes: Thyroid appears normal. No evident thoracic adenopathy by size criteria. There are occasional subcentimeter mediastinal lymph nodes which do not meet size criteria for pathologic significance. No esophageal lesions are evident. Lungs/Pleura: There are areas of atelectatic change in the lung bases. No evident edema or consolidation. No pleural effusions. No pneumothorax. Trachea and major bronchial structures appear patent. Musculoskeletal: There is postoperative change in the lower cervical region. There are no blastic or lytic bone lesions. No fracture or dislocation. No evident chest wall lesions. Review of the MIP images confirms the above findings. CTA ABDOMEN AND PELVIS FINDINGS VASCULAR Aorta: There is no appreciable abdominal aortic aneurysm or dissection. There are scattered foci of calcification within the aorta. No hemodynamically significant obstruction noted within the aorta. Celiac: The proximal celiac artery is tortuous. There is an area of approximately 70% diameter stenosis in the proximal celiac artery. Celiac artery branches are widely patent. No aneurysm or dissection evident in these vessels.  SMA: Superior mesenteric artery and its branches are widely patent. No aneurysm or dissection evident. Renals: There is a single renal artery on the left. An accessory branch to the upper pole left kidney arises slightly beyond the ostium on the left. There are 2 right renal arteries which arise from the aorta. These vessels appear widely patent. No aneurysm or dissection involving renal arteries. No fibromuscular dysplasia evident. IMA: Inferior mesenteric artery and its branches are diminutive. No aneurysm or dissection involving these vessels. Inflow: There are foci of calcification in each common iliac artery without hemodynamically significant obstruction. Scattered foci of calcification noted in each internal and external iliac artery without hemodynamically significant obstruction. Minimal calcification noted in the common femoral arteries bilaterally without hemodynamically significant obstruction. Visualized profunda femoral and superficial femoral arteries show modest calcification without hemodynamically significant obstruction. No aneurysm or dissection involving pelvic arterial vessels noted. Note that artifact from total hip replacements near the origins of the profunda femoral and superficial femoral arteries causes some limitation of visualization in these areas. Veins: No obvious venous abnormality within the limitations of this arterial phase study. Review of the MIP images confirms the above findings. NON-VASCULAR Hepatobiliary: No focal liver lesions are appreciable. There is no appreciable gallbladder wall thickening. No biliary duct dilatation evident. Pancreas: There is no pancreatic mass or inflammatory focus. Spleen: No splenic lesions are evident. Adrenals/Urinary Tract: Adrenals bilaterally appear normal. There is a 7 mm cyst arising from the lateral inferior left kidney. There is no hydronephrosis on either side. There is no appreciable renal or ureteral calculus on either side. Urinary  bladder is midline with wall thickness within normal limits. Stomach/Bowel: There is no appreciable bowel wall or mesenteric thickening. There are occasional sigmoid diverticula without diverticulitis. There is no bowel obstruction. Terminal ileum appears normal. No demonstrable free air or portal venous air. Lymphatic: There is no adenopathy in the abdomen or pelvis. Reproductive: Prostate and seminal vesicles appear normal in size and contour. Other: No periappendiceal region inflammatory change.  No abscess or ascites evident abdomen or pelvis. Musculoskeletal: Total hip replacement prostheses present. There are foci of degenerative change in the lumbar spine. Postoperative change noted at L5. No blastic or lytic bone lesions. No abdominal wall or intramuscular lesions are evident. Review of the MIP images confirms the above findings. IMPRESSION: CT angiogram chest: 1. No thoracic aortic aneurysm or dissection. There is aortic atherosclerosis. There are foci of coronary artery calcification. 2.  No pulmonary embolus. 3. Bibasilar atelectasis. No lung edema or consolidation. No pleural effusions. 4.  No evident adenopathy. CT angiogram abdomen; CT angiogram pelvis: 1. No aneurysm or dissection involving the aorta, major mesenteric, and major pelvic arterial vessels. No fibromuscular dysplasia. 2. 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 No other hemodynamically significant stenoses noted in the abdomen or pelvis. Scattered areas of vascular calcification without hemodynamically significant obstruction noted in multiple arterial vessels. 3. Sigmoid diverticula without diverticulitis. No bowel wall thickening or bowel obstruction. No abscess in the abdomen or pelvis. 4. No renal or ureteral calculi. No hydronephrosis on either side. Urinary bladder wall thickness normal. 5.  Postoperative change at L5.  Total hip replacements bilaterally. Aortic Atherosclerosis (ICD10-I70.0). Electronically  Signed   By: Lowella Grip III M.D.   On: 03/16/2020 15:21    Procedures Procedures   Medications Ordered in ED Medications  morphine 4 MG/ML injection 4 mg (4 mg Intravenous Given 03/16/20 1308)  ondansetron (ZOFRAN) injection 4 mg (4 mg Intravenous Given 03/16/20 1309)  iohexol (OMNIPAQUE) 350 MG/ML injection 65 mL (65 mLs Intravenous Contrast Given 03/16/20 1501)    ED Course  I have reviewed the triage vital signs and the nursing notes.  Pertinent labs & imaging results that were available during my care of the patient were reviewed by me and considered in my medical decision making (see chart for details).    MDM Rules/Calculators/A&P                          Pt's pain has improved.  CT angio neg for acute.  Pt's urine is pending at shift change.  Pt and his daughter do not want him to go to a rehab facility.  They have home health.  Pt signed out to Dr. Sabra Heck at shift change. Final Clinical Impression(s) / ED Diagnoses Final diagnoses:  Atypical chest pain  Acute bilateral low back pain without sciatica    Rx / DC Orders ED Discharge Orders         Ordered    HYDROcodone-acetaminophen (NORCO/VICODIN) 5-325 MG tablet  Every 4 hours PRN        03/16/20 1544           Isla Pence, MD 03/16/20 1546

## 2020-03-16 NOTE — Discharge Instructions (Signed)
It appears that you do have a urinary infection, please take cephalexin twice a day for 7 days, if your urinary culture shows that you need a different antibiotic you will be contacted.  Return to the emergency department for worsening symptoms including fevers vomiting or pain.  See your doctor within 48 hours for

## 2020-03-16 NOTE — ED Notes (Signed)
Pt out of room at this time; in CT.

## 2020-03-16 NOTE — ED Provider Notes (Signed)
Pt has back pain - testing has been unremarkable, labs show UTI, pt given Keflex, family desires d/c - and has home health, VS without fever or tachycardia, percocet given for pain prior to d/c.   Eber Hong, MD 03/16/20 [phone]

## 2020-03-17 LAB — URINE CULTURE: Culture: >=100000 — AB

## 2020-03-18 ENCOUNTER — Telehealth: Payer: Self-pay

## 2020-03-18 NOTE — Telephone Encounter (Signed)
Transition Care Management Unsuccessful Follow-up Telephone Call  Date of discharge and from where:  03/16/20 from [facility]  Attempts:  1st Attempt  Reason for unsuccessful TCM follow-up call:  Unable to leave message

## 2020-03-19 ENCOUNTER — Encounter: Payer: Self-pay | Admitting: Sports Medicine

## 2020-03-19 ENCOUNTER — Other Ambulatory Visit: Payer: Self-pay

## 2020-03-19 ENCOUNTER — Ambulatory Visit (INDEPENDENT_AMBULATORY_CARE_PROVIDER_SITE_OTHER): Payer: Medicare FFS | Admitting: Sports Medicine

## 2020-03-19 DIAGNOSIS — N3000 Acute cystitis without hematuria: Secondary | ICD-10-CM | POA: Diagnosis not present

## 2020-03-19 MED ORDER — PREDNISONE 50 MG PO TABS: ORAL_TABLET | ORAL | 0 refills | Status: DC

## 2020-03-19 MED ORDER — CYCLOBENZAPRINE HCL 5 MG PO TABS: ORAL_TABLET | ORAL | 0 refills | Status: DC

## 2020-03-19 MED ORDER — HYDROCODONE-ACETAMINOPHEN 10-325 MG PO TABS: 1.0000 | ORAL_TABLET | Freq: Three times a day (TID) | ORAL | 0 refills | Status: DC | PRN

## 2020-03-19 NOTE — Telephone Encounter (Signed)
Transition Care Management Unsuccessful Follow-up Telephone Call  Date of discharge and from where:  03/16/20 from [facility]  Attempts:  2nd Attempt  Reason for unsuccessful TCM follow-up call:  Unable to leave message

## 2020-03-19 NOTE — Progress Notes (Signed)
    Procedures performed today:    None.  Independent interpretation of notes and tests performed by another provider:   None.  Brief History, Exam, Impression, and Recommendations:    UTI (urinary tract infection) This is a pleasant 85 year old male, he recently developed some nausea and vomiting followed by chest wall and back pain. No melena, hematochezia, hematemesis. Ultimately the pain worsened to the point he was seen in the ED, in the emergency department labs were negative with the exception of his urine, ultimately grew out group B strep. He was started on Keflex. CT chest, abdomen, pelvis with angiography was negative. Troponins were negative. He has improved significantly with antibiotics, still has some chest wall pain, and back pain to palpation. Nausea is better, he is able to keep down foods. I think he suffered a chest wall and periscapular muscle strain due to his heaving, we will add prednisone, a very short and judicious course of Flexeril, hydrocodone for his pain, and he will continue his antibiotics. Close follow-up within a week if not significantly better.    ___________________________________________ [name]. [name], M.D., ABFM., CAQSM. Primary Care and Sports Medicine [facility]  Adjunct Instructor of Family Medicine  [employer]

## 2020-03-19 NOTE — Assessment & Plan Note (Signed)
This is a pleasant 85 year old male, he recently developed some nausea and vomiting followed by chest wall and back pain. No melena, hematochezia, hematemesis. Ultimately the pain worsened to the point he was seen in the ED, in the emergency department labs were negative with the exception of his urine, ultimately grew out group B strep. He was started on Keflex. CT chest, abdomen, pelvis with angiography was negative. Troponins were negative. He has improved significantly with antibiotics, still has some chest wall pain, and back pain to palpation. Nausea is better, he is able to keep down foods. I think he suffered a chest wall and periscapular muscle strain due to his heaving, we will add prednisone, a very short and judicious course of Flexeril, hydrocodone for his pain, and he will continue his antibiotics. Close follow-up within a week if not significantly better.

## 2020-03-20 ENCOUNTER — Telehealth: Payer: Self-pay

## 2020-03-20 NOTE — Telephone Encounter (Signed)
Transition Care Management Unsuccessful Follow-up Telephone Call  Date of discharge and from where:  03/16/20 from [facility]  Attempts:  3rd Attempt  Reason for unsuccessful TCM follow-up call:  Unable to leave message

## 2020-03-20 NOTE — Telephone Encounter (Signed)
[name] advised to have patient assessed at [facility] or ER if his O2 level was staying in the 80s. She verbalized understanding.

## 2020-03-20 NOTE — Telephone Encounter (Signed)
Steven Patton, daughter, called to report that patient has slept most of the day. She woke him up because he has not been to the bathroom and checked his vitals. Temp 95, Bp 137/85, HR 78, O2, 89%. Daughter is concerned about the O2 level. Please advise.

## 2020-03-20 NOTE — Telephone Encounter (Signed)
Thank you :)

## 2020-04-29 ENCOUNTER — Other Ambulatory Visit: Payer: Self-pay | Admitting: Sports Medicine

## 2020-04-29 DIAGNOSIS — I10 Essential (primary) hypertension: Secondary | ICD-10-CM

## 2020-05-01 ENCOUNTER — Encounter: Payer: Self-pay | Admitting: Sports Medicine

## 2020-05-01 ENCOUNTER — Other Ambulatory Visit: Payer: Self-pay

## 2020-05-01 ENCOUNTER — Ambulatory Visit: Payer: Medicare FFS | Admitting: Sports Medicine

## 2020-05-01 DIAGNOSIS — Z9049 Acquired absence of other specified parts of digestive tract: Secondary | ICD-10-CM

## 2020-05-01 MED ORDER — HYDROCODONE-ACETAMINOPHEN 10-325 MG PO TABS: 1.0000 | ORAL_TABLET | Freq: Three times a day (TID) | ORAL | 0 refills | Status: DC | PRN

## 2020-05-01 NOTE — Progress Notes (Signed)
    Procedures performed today:    None.  Independent interpretation of notes and tests performed by another provider:   None.  Brief History, Exam, Impression, and Recommendations:    History of laparoscopic cholecystectomy This is a pleasant 85 year old male, not a month ago he had a laparoscopic cholecystectomy with percutaneous drain, based on notes it looks like he did have an intraoperative cholangiogram. Unfortunately he is starting to have increasing abdominal pain, epigastric, colicky. He did recently have the drain removed. On exam his belly is soft, but he is complaining of significant pain with Valsalva, not related to food, no melena or hematochezia, nausea, vomiting, diarrhea. I think he is likely having some incisional pain, I doubt any residual choledocholithiasis considering negative intraoperative cholangiogram. We are to get a CT abdomen and pelvis with oral and IV contrast considering he is postop. Refilling hydrocodone but I did encourage him to follow-up with his surgeon for postoperative complaints and pain rather than knee.     ___________________________________________ [name]. [name], M.D., ABFM., CAQSM. Primary Care and Sports Medicine [facility]  Adjunct Instructor of Family Medicine  [employer]

## 2020-05-01 NOTE — Assessment & Plan Note (Signed)
This is a pleasant 85 year old male, not a month ago he had a laparoscopic cholecystectomy with percutaneous drain, based on notes it looks like he did have an intraoperative cholangiogram. Unfortunately he is starting to have increasing abdominal pain, epigastric, colicky. He did recently have the drain removed. On exam his belly is soft, but he is complaining of significant pain with Valsalva, not related to food, no melena or hematochezia, nausea, vomiting, diarrhea. I think he is likely having some incisional pain, I doubt any residual choledocholithiasis considering negative intraoperative cholangiogram. We are to get a CT abdomen and pelvis with oral and IV contrast considering he is postop. Refilling hydrocodone but I did encourage him to follow-up with his surgeon for postoperative complaints and pain rather than knee.

## 2020-05-15 ENCOUNTER — Ambulatory Visit: Payer: Medicare FFS | Admitting: Sports Medicine

## 2020-05-31 ENCOUNTER — Encounter: Payer: Self-pay | Admitting: Sports Medicine

## 2020-05-31 ENCOUNTER — Ambulatory Visit: Payer: Medicare FFS | Admitting: Sports Medicine

## 2020-05-31 ENCOUNTER — Ambulatory Visit (INDEPENDENT_AMBULATORY_CARE_PROVIDER_SITE_OTHER): Payer: Medicare FFS

## 2020-05-31 ENCOUNTER — Other Ambulatory Visit: Payer: Self-pay

## 2020-05-31 VITALS — BP 126/82 | HR 111 | Temp 98.0°F | Ht 69.0 in | Wt 276.0 lb

## 2020-05-31 DIAGNOSIS — R630 Anorexia: Secondary | ICD-10-CM | POA: Diagnosis not present

## 2020-05-31 DIAGNOSIS — R0989 Other specified symptoms and signs involving the circulatory and respiratory systems: Secondary | ICD-10-CM | POA: Diagnosis not present

## 2020-05-31 DIAGNOSIS — R059 Cough, unspecified: Secondary | ICD-10-CM | POA: Diagnosis not present

## 2020-05-31 DIAGNOSIS — Z9049 Acquired absence of other specified parts of digestive tract: Secondary | ICD-10-CM | POA: Diagnosis not present

## 2020-05-31 MED ORDER — MOXIFLOXACIN HCL 400 MG PO TABS: 400.0000 mg | ORAL_TABLET | Freq: Every day | ORAL | 0 refills | Status: AC

## 2020-05-31 NOTE — Assessment & Plan Note (Signed)
Increasing cough with coarse sounds in the right base. Adding a chest x-ray, moxifloxacin considering recent hospitalization although this is not covered we can just use azithromycin. He is a bit tachycardic, if I do see evidence of prerenal azotemia on labs we will likely bring him back for some IV fluids or send him elsewhere for this.

## 2020-05-31 NOTE — Progress Notes (Signed)
    Procedures performed today:    None.  Independent interpretation of notes and tests performed by another provider:   None.  Brief History, Exam, Impression, and Recommendations:    History of laparoscopic cholecystectomy Steven Patton returns, he is a pleasant 85 year old male, he is approximately 2 months post laparoscopic cholecystectomy, unfortunately he needed a percutaneous drain, it does look like he had an intraoperative cholangiogram. I saw him about a month ago with increasing abdominal pain, epigastric and colicky. His belly was however soft. We added a CT scan with oral and IV contrast, abdomen and pelvis, unfortunately this was not done. He ultimately got an abdominal ultrasound on April 1 that showed a collection of fluid in the gallbladder fossa potentially infection or bile leak. At this juncture he is having decrease in appetite, 20 pound weight loss. Abdomen continues to be surprisingly soft. We will go ahead and add some labs and he really needs to get the CT done.  Coughing Increasing cough with coarse sounds in the right base. Adding a chest x-ray, moxifloxacin considering recent hospitalization although this is not covered we can just use azithromycin. He is a bit tachycardic, if I do see evidence of prerenal azotemia on labs we will likely bring him back for some IV fluids or send him elsewhere for this.    ___________________________________________ [name]. [name], M.D., ABFM., CAQSM. Primary Care and Sports Medicine [place] [facility]  Adjunct Instructor of Family Medicine  [employer]

## 2020-05-31 NOTE — Assessment & Plan Note (Signed)
Steven Patton returns, he is a pleasant 85 year old male, he is approximately 2 months post laparoscopic cholecystectomy, unfortunately he needed a percutaneous drain, it does look like he had an intraoperative cholangiogram. I saw him about a month ago with increasing abdominal pain, epigastric and colicky. His belly was however soft. We added a CT scan with oral and IV contrast, abdomen and pelvis, unfortunately this was not done. He ultimately got an abdominal ultrasound on April 1 that showed a collection of fluid in the gallbladder fossa potentially infection or bile leak. At this juncture he is having decrease in appetite, 20 pound weight loss. Abdomen continues to be surprisingly soft. We will go ahead and add some labs and he really needs to get the CT done.

## 2020-06-01 LAB — COMPREHENSIVE METABOLIC PANEL
AG Ratio: 0.9 (calc) — ABNORMAL LOW (ref 1.0–2.5)
ALT: 28 U/L (ref 9–46)
AST: 27 U/L (ref 10–35)
Albumin: 3.9 g/dL (ref 3.6–5.1)
Alkaline phosphatase (APISO): 86 U/L (ref 35–144)
BUN/Creatinine Ratio: 14 (calc) (ref 6–22)
BUN: 22 mg/dL (ref 7–25)
CO2: 28 mmol/L (ref 20–32)
Calcium: 10 mg/dL (ref 8.6–10.3)
Chloride: 96 mmol/L — ABNORMAL LOW (ref 98–110)
Creat: 1.52 mg/dL — ABNORMAL HIGH (ref 0.70–1.11)
Globulin: 4.4 g/dL (calc) — ABNORMAL HIGH (ref 1.9–3.7)
Glucose, Bld: 125 mg/dL — ABNORMAL HIGH (ref 65–99)
Potassium: 4.5 mmol/L (ref 3.5–5.3)
Sodium: 138 mmol/L (ref 135–146)
Total Bilirubin: 1 mg/dL (ref 0.2–1.2)
Total Protein: 8.3 g/dL — ABNORMAL HIGH (ref 6.1–8.1)

## 2020-06-01 LAB — CBC WITH DIFFERENTIAL/PLATELET
Absolute Monocytes: 1212 cells/uL — ABNORMAL HIGH (ref 200–950)
Basophils Absolute: 24 cells/uL (ref 0–200)
Basophils Relative: 0.2 %
Eosinophils Absolute: 204 cells/uL (ref 15–500)
Eosinophils Relative: 1.7 %
HCT: 44.4 % (ref 38.5–50.0)
Hemoglobin: 14 g/dL (ref 13.2–17.1)
Lymphs Abs: 2052 cells/uL (ref 850–3900)
MCH: 28.1 pg (ref 27.0–33.0)
MCHC: 31.5 g/dL — ABNORMAL LOW (ref 32.0–36.0)
MCV: 89.2 fL (ref 80.0–100.0)
MPV: 11.2 fL (ref 7.5–12.5)
Monocytes Relative: 10.1 %
Neutro Abs: 8508 cells/uL — ABNORMAL HIGH (ref 1500–7800)
Neutrophils Relative %: 70.9 %
Platelets: 188 10*3/uL (ref 140–400)
RBC: 4.98 10*6/uL (ref 4.20–5.80)
RDW: 14.7 % (ref 11.0–15.0)
Total Lymphocyte: 17.1 %
WBC: 12 10*3/uL — ABNORMAL HIGH (ref 3.8–10.8)

## 2020-06-01 LAB — AMYLASE: Amylase: 50 U/L (ref 21–101)

## 2020-06-01 LAB — LIPASE: Lipase: 58 U/L (ref 7–60)

## 2020-06-05 LAB — URINALYSIS W MICROSCOPIC + REFLEX CULTURE
Bacteria, UA: NONE SEEN /HPF
Bilirubin Urine: NEGATIVE
Glucose, UA: NEGATIVE
Hgb urine dipstick: NEGATIVE
Ketones, ur: NEGATIVE
Leukocyte Esterase: NEGATIVE
Nitrites, Initial: NEGATIVE
Protein, ur: NEGATIVE
RBC / HPF: NONE SEEN /HPF (ref 0–2)
Specific Gravity, Urine: 1.016 (ref 1.001–1.035)
WBC, UA: NONE SEEN /HPF (ref 0–5)
pH: 6 (ref 5.0–8.0)

## 2020-06-05 LAB — NO CULTURE INDICATED

## 2020-06-07 ENCOUNTER — Encounter: Payer: Self-pay | Admitting: Sports Medicine

## 2020-06-07 ENCOUNTER — Telehealth: Payer: Self-pay

## 2020-06-07 ENCOUNTER — Telehealth (INDEPENDENT_AMBULATORY_CARE_PROVIDER_SITE_OTHER): Payer: Medicare FFS | Admitting: Sports Medicine

## 2020-06-07 DIAGNOSIS — U071 COVID-19: Secondary | ICD-10-CM | POA: Diagnosis not present

## 2020-06-07 HISTORY — DX: COVID-19: U07.1

## 2020-06-07 MED ORDER — PAXLOVID 20 X 150 MG & 10 X 100MG PO TBPK: 3.0000 | ORAL_TABLET | Freq: Two times a day (BID) | ORAL | 0 refills | Status: AC

## 2020-06-07 MED ORDER — DEXAMETHASONE 4 MG PO TABS: 4.0000 mg | ORAL_TABLET | Freq: Three times a day (TID) | ORAL | 0 refills | Status: DC

## 2020-06-07 NOTE — Telephone Encounter (Signed)
Spoke with [facility], he will need to switch to a virtual visit with me and I can give him some advice, as for the imaging, that scan just needs to be done on Monday, he will be about 5 days out which is fine, and imaging should just use typical precautions for COVID patients.

## 2020-06-07 NOTE — Progress Notes (Signed)
   Virtual Visit via Telephone   I connected with  Steven Patton  on 06/07/20 by telephone/telehealth and verified that I am speaking with the correct person using two identifiers.   I discussed the limitations, risks, security and privacy concerns of performing an evaluation and management service by telephone, including the higher likelihood of inaccurate diagnosis and treatment, and the availability of in person appointments.  We also discussed the likely need of an additional face to face encounter for complete and high quality delivery of care.  I also discussed with the patient that there may be a patient responsible charge related to this service. The patient expressed understanding and wishes to proceed.  Provider location is in medical facility. Patient location is at their home, different from provider location. People involved in care of the patient during this telehealth encounter were myself, my nurse/medical assistant, and my front office/scheduling team member.  Review of Systems: No fevers, chills, night sweats, weight loss, chest pain, or shortness of breath.   Objective Findings:    General: Speaking full sentences, no audible heavy breathing.  Sounds alert and appropriately interactive.    Independent interpretation of tests performed by another provider:   None.  Brief History, Exam, Impression, and Recommendations:    COVID-19 This is an 85 year old male with multiple medical problems, unfortunately he has had several days of loss of appetite, chills, achiness, severe fatigue. He did a home COVID test which was positive. He has had 2 vaccines but not his booster. Considering his comorbidities we are going to use Decadron and Paxlovid. He will finish out his moxifloxacin.  I discussed the above assessment and treatment plan with the patient. The patient was provided an opportunity to ask questions and all were answered. The patient agreed with the plan and  demonstrated an understanding of the instructions.   The patient was advised to call back or seek an in-person evaluation if the symptoms worsen or if the condition fails to improve as anticipated.   I provided 30 minutes of verbal and non-verbal time during this encounter date, time was needed to gather information, review chart, records, communicate/coordinate with staff remotely, as well as complete documentation.  Specifically we discussed novel treatments for COVID-19 considering his multiple comorbidities.   ___________________________________________ [name]. [name], M.D., ABFM., CAQSM. Primary Care and Sports Medicine [place] [facility]  Adjunct Professor of Family Medicine  [employer]

## 2020-06-07 NOTE — Assessment & Plan Note (Signed)
This is an 85 year old male with multiple medical problems, unfortunately he has had several days of loss of appetite, chills, achiness, severe fatigue. He did a home COVID test which was positive. He has had 2 vaccines but not his booster. Considering his comorbidities we are going to use Decadron and Paxlovid. He will finish out his moxifloxacin.

## 2020-06-07 NOTE — Telephone Encounter (Signed)
Pt's daughter called regarding positive Covid test. Copied from MyChart message she also sent.  "Dr t i done a home covid test on dad last night and it came back positive. i guess this will explain everything tasting bad, no appetite, weight loss and the gunk in his lungs. He still isnt eating or drinking much. And very fatigued. Im just not sure what we need to do at this point. We already had an appt with you today but with this and dads condition, what now? "

## 2020-06-10 ENCOUNTER — Ambulatory Visit (INDEPENDENT_AMBULATORY_CARE_PROVIDER_SITE_OTHER): Payer: Medicare FFS

## 2020-06-10 ENCOUNTER — Other Ambulatory Visit: Payer: Self-pay

## 2020-06-10 DIAGNOSIS — Z9049 Acquired absence of other specified parts of digestive tract: Secondary | ICD-10-CM | POA: Diagnosis not present

## 2020-06-10 DIAGNOSIS — R509 Fever, unspecified: Secondary | ICD-10-CM

## 2020-06-10 DIAGNOSIS — R630 Anorexia: Secondary | ICD-10-CM | POA: Diagnosis not present

## 2020-06-10 MED ORDER — IOHEXOL 300 MG/ML  SOLN
100.0000 mL | Freq: Once | INTRAMUSCULAR | Status: AC | PRN
  Administered 2020-06-10: 80 mL via INTRAVENOUS

## 2020-06-12 ENCOUNTER — Telehealth (INDEPENDENT_AMBULATORY_CARE_PROVIDER_SITE_OTHER): Payer: Medicare FFS | Admitting: Sports Medicine

## 2020-06-12 DIAGNOSIS — Z9049 Acquired absence of other specified parts of digestive tract: Secondary | ICD-10-CM

## 2020-06-12 NOTE — Progress Notes (Signed)
   Virtual Visit via Telephone   I connected with  Audree Camel  on 06/12/20 by telephone/telehealth and verified that I am speaking with the correct person using two identifiers.   I discussed the limitations, risks, security and privacy concerns of performing an evaluation and management service by telephone, including the higher likelihood of inaccurate diagnosis and treatment, and the availability of in person appointments.  We also discussed the likely need of an additional face to face encounter for complete and high quality delivery of care.  I also discussed with the patient that there may be a patient responsible charge related to this service. The patient expressed understanding and wishes to proceed.  Provider location is in medical facility. Patient location is at their home, different from provider location. People involved in care of the patient during this telehealth encounter were myself, my nurse/medical assistant, and my front office/scheduling team member.  Review of Systems: No fevers, chills, night sweats, weight loss, chest pain, or shortness of breath.   Objective Findings:    General: Speaking full sentences, no audible heavy breathing.  Sounds alert and appropriately interactive.    Independent interpretation of tests performed by another provider:   None.  Brief History, Exam, Impression, and Recommendations:    History of laparoscopic cholecystectomy I spoke to Lenard Galloway his daughter, he is a pleasant 85 year old male, approximately 2-1/2 months post laparoscopic cholecystectomy, he did unfortunately need percutaneous draining, and it looks like he did have an intraoperative cholangiogram, he continued to have increasing right upper quadrant abdominal pain. Belly was soft at the time, we ordered a CT abdomen and pelvis with oral and IV contrast approximately a week and a half ago, unfortunately it took until yesterday to get this done. It does show some fluid  collections in the gallbladder fossa that may represent abscesses versus bilomas/bile leak. He has had a decrease in appetite, 20 pound weight loss, abdominal pain. It sounds like he also ended up with COVID-19 along the way, I have started him on Paxlovid. We spoke on the phone today and he is doing a little bit better but still has significant symptoms, I have stressed the importance of getting in with his general surgeon, sounds like the next appointment was a week away with Dr. Darlys Gales at [facility], we will try to get him in a little sooner.   I discussed the above assessment and treatment plan with the patient. The patient was provided an opportunity to ask questions and all were answered. The patient agreed with the plan and demonstrated an understanding of the instructions.   The patient was advised to call back or seek an in-person evaluation if the symptoms worsen or if the condition fails to improve as anticipated.   I provided 30 minutes of verbal and non-verbal time during this encounter date, time was needed to gather information, review chart, records, communicate/coordinate with staff remotely, as well as complete documentation.  Specifically we discussed the CT scan results, anatomy, and potential complications of laparoscopic cholecystectomy for cholecystitis necessitating percutaneous drainage.  We talked about his current diagnosis of COVID, the function, treatment, and recovery expected from Paxlovid.   ___________________________________________ [name]. [name], M.D., ABFM., CAQSM. Primary Care and Sports Medicine [place] [facility]  Adjunct Professor of Family Medicine  [employer]

## 2020-06-12 NOTE — Assessment & Plan Note (Signed)
I spoke to Lenard Galloway his daughter, he is a pleasant 85 year old male, approximately 2-1/2 months post laparoscopic cholecystectomy, he did unfortunately need percutaneous draining, and it looks like he did have an intraoperative cholangiogram, he continued to have increasing right upper quadrant abdominal pain. Belly was soft at the time, we ordered a CT abdomen and pelvis with oral and IV contrast approximately a week and a half ago, unfortunately it took until yesterday to get this done. It does show some fluid collections in the gallbladder fossa that may represent abscesses versus bilomas/bile leak. He has had a decrease in appetite, 20 pound weight loss, abdominal pain. It sounds like he also ended up with COVID-19 along the way, I have started him on Paxlovid. We spoke on the phone today and he is doing a little bit better but still has significant symptoms, I have stressed the importance of getting in with his general surgeon, sounds like the next appointment was a week away with Dr. Darlys Gales at [place], we will try to get him in a little sooner.

## 2020-06-13 ENCOUNTER — Telehealth: Payer: Self-pay | Admitting: Sports Medicine

## 2020-06-13 NOTE — Telephone Encounter (Signed)
Dr.T, [name] (Mickey's daughter) is wanting to talk to you for about 2 minutes to let you know what the Surgeon had to say and what he suggested. She wants to run it by you, if you can spare a couple of minutes and talk with her. Her number is [phone]. Thank you so much

## 2020-06-19 ENCOUNTER — Other Ambulatory Visit: Payer: Self-pay | Admitting: Sports Medicine

## 2020-06-19 DIAGNOSIS — E876 Hypokalemia: Secondary | ICD-10-CM

## 2020-06-19 DIAGNOSIS — E1165 Type 2 diabetes mellitus with hyperglycemia: Secondary | ICD-10-CM

## 2020-06-19 DIAGNOSIS — E669 Obesity, unspecified: Secondary | ICD-10-CM

## 2020-06-19 DIAGNOSIS — E1169 Type 2 diabetes mellitus with other specified complication: Secondary | ICD-10-CM

## 2020-08-12 ENCOUNTER — Telehealth: Payer: Self-pay

## 2020-08-12 NOTE — Telephone Encounter (Signed)
Transition Care Management Unsuccessful Follow-up Telephone Call  Date of discharge and from where:  08/10/2020 from [facility]  Attempts:  1st Attempt  Reason for unsuccessful TCM follow-up call:  Left voice message

## 2020-08-13 NOTE — Telephone Encounter (Signed)
.  Transition Care Management Follow-up Telephone Call Date of discharge and from where: 08/10/20 from [facility] How have you been since you were released from the hospital? Doing better. Any questions or concerns? No  Items Reviewed: Did the pt receive and understand the discharge instructions provided? Yes  Medications obtained and verified? No  Other? No  Any new allergies since your discharge? No  Dietary orders reviewed? No Do you have support at home? Yes   Home Care and Equipment/Supplies: Were home health services ordered? no  Functional Questionnaire: (I = Independent and D = Dependent) ADLs: I  Bathing/Dressing- I  Meal Prep- I  Eating- I  Maintaining continence- I  Transferring/Ambulation- I  Managing Meds- I  Follow up appointments reviewed:  PCP Hospital f/u appt confirmed? No   Specialist Hospital f/u appt confirmed? Yes  Scheduled to see his surgeon on 08/13/20 Are transportation arrangements needed? No  If their condition worsens, is the pt aware to call PCP or go to the Emergency Dept.? Yes Was the patient provided with contact information for the PCP's office or ED? Yes Was to pt encouraged to call back with questions or concerns? Yes

## 2020-08-22 ENCOUNTER — Other Ambulatory Visit: Payer: Self-pay | Admitting: Sports Medicine

## 2020-08-22 DIAGNOSIS — M1A0720 Idiopathic chronic gout, left ankle and foot, without tophus (tophi): Secondary | ICD-10-CM

## 2020-08-22 DIAGNOSIS — M109 Gout, unspecified: Secondary | ICD-10-CM

## 2020-10-03 ENCOUNTER — Telehealth: Payer: Self-pay | Admitting: Sports Medicine

## 2020-10-03 NOTE — Telephone Encounter (Signed)
Copied from CRM [id number]. Topic: Medicare AWV >> Oct 03, 2020  5:24 PM Claudette Laws R wrote: Reason for CRM:   No answer unable to leave a  message for patient to call back and schedule their Medicare Annual Wellness Visit (AWV) either virtually or by telephone.  Please schedule at any time with [facility] Nurse Health Advisor. Saturdays are available to schedule in the month of September.   No history of AWV: eligible as of 02/02/2009 awvi per [employer]   40-minute appointment   Any questions, please contact me at [phone]

## 2020-10-14 ENCOUNTER — Other Ambulatory Visit: Payer: Self-pay

## 2020-10-14 ENCOUNTER — Ambulatory Visit (INDEPENDENT_AMBULATORY_CARE_PROVIDER_SITE_OTHER): Payer: Medicare FFS | Admitting: Sports Medicine

## 2020-10-14 ENCOUNTER — Ambulatory Visit (INDEPENDENT_AMBULATORY_CARE_PROVIDER_SITE_OTHER): Payer: Medicare FFS

## 2020-10-14 VITALS — BP 143/81 | HR 86 | Temp 98.8°F | Ht 69.0 in | Wt 271.0 lb

## 2020-10-14 DIAGNOSIS — Z9049 Acquired absence of other specified parts of digestive tract: Secondary | ICD-10-CM | POA: Diagnosis not present

## 2020-10-14 DIAGNOSIS — Z8719 Personal history of other diseases of the digestive system: Secondary | ICD-10-CM

## 2020-10-14 DIAGNOSIS — K9189 Other postprocedural complications and disorders of digestive system: Secondary | ICD-10-CM | POA: Diagnosis not present

## 2020-10-14 DIAGNOSIS — K810 Acute cholecystitis: Secondary | ICD-10-CM

## 2020-10-14 DIAGNOSIS — K9186 Retained cholelithiasis following cholecystectomy: Secondary | ICD-10-CM | POA: Insufficient documentation

## 2020-10-14 LAB — I-STAT CREATININE (MANUAL ENTRY): Creatinine, Ser: 1.2 — AB (ref 0.50–1.10)

## 2020-10-14 MED ORDER — AMOXICILLIN-POT CLAVULANATE 875-125 MG PO TABS: 1.0000 | ORAL_TABLET | Freq: Two times a day (BID) | ORAL | 0 refills | Status: AC

## 2020-10-14 MED ORDER — IOHEXOL 300 MG/ML  SOLN
100.0000 mL | Freq: Once | INTRAMUSCULAR | Status: AC | PRN
  Administered 2020-10-14: 100 mL via INTRAVENOUS

## 2020-10-14 MED ORDER — METRONIDAZOLE 500 MG PO TABS: 500.0000 mg | ORAL_TABLET | Freq: Two times a day (BID) | ORAL | 0 refills | Status: DC

## 2020-10-14 NOTE — Addendum Note (Signed)
Addended by: Monica Becton on: 10/14/2020 04:50 PM   Modules accepted: Orders

## 2020-10-14 NOTE — Assessment & Plan Note (Addendum)
This is a pleasant 85 year old male, he is post cholecystectomy for gangrenous cholecystitis back in February. He has suffered a protracted course of fluid collections, infections, multiple percutaneous drain placements. Cultures in the past have historically grown out Terrisporobacter glycolycus, formally known as Clostridium glycolycus. No sensitivities reported. Continues to have recurrences, he currently has pain in his right flank at the site of the percutaneous drain placement, it is profoundly purulent. He is clinically stable today. He has been treated in the past with Augmentin, not on any antibiotics currently.  I obtained a culture today, we will use metronidazole and Augmentin. We will also get him a second opinion from [place] [facility]. I would like a CT with IV contrast abdomen and pelvis to further elucidate intra-abdominal fluid collection.  Update: CT shows abscess, referral for urgent IR drain.

## 2020-10-14 NOTE — Progress Notes (Addendum)
    Procedures performed today:    None.  Independent interpretation of notes and tests performed by another provider:   CT scan personally reviewed and shows a very large subhepatic fluid collection consistent with abscess  Brief History, Exam, Impression, and Recommendations:    Gangrenous cholecystitis This is a pleasant 85 year old male, he is post cholecystectomy for gangrenous cholecystitis back in February. He has suffered a protracted course of fluid collections, infections, multiple percutaneous drain placements. Cultures in the past have historically grown out Terrisporobacter glycolycus, formally known as Clostridium glycolycus. No sensitivities reported. Continues to have recurrences, he currently has pain in his right flank at the site of the percutaneous drain placement, it is profoundly purulent. He is clinically stable today. He has been treated in the past with Augmentin, not on any antibiotics currently.  I obtained a culture today, we will use metronidazole and Augmentin. We will also get him a second opinion from [place] [facility]. I would like a CT with IV contrast abdomen and pelvis to further elucidate intra-abdominal fluid collection.  Update: CT shows abscess, referral for urgent IR drain.   ___________________________________________ [name]. [name], M.D., ABFM., CAQSM. Primary Care and Sports Medicine [place] [facility]  Adjunct Instructor of Family Medicine  [employer]

## 2020-10-16 ENCOUNTER — Other Ambulatory Visit: Payer: Self-pay | Admitting: Radiology

## 2020-10-16 ENCOUNTER — Telehealth (HOSPITAL_COMMUNITY): Payer: Self-pay

## 2020-10-16 NOTE — Telephone Encounter (Signed)
-----   Message from Oley Balm, MD sent at 10/15/2020  3:38 PM EDT ----- Regarding: RE: Drain placement Ok  CT drain perihepatic recurrent collection  DDH    ----- Message ----- From: Anderson Malta Sent: 10/15/2020   3:11 PM EDT To: Ir Procedure Requests Subject: Drain placement                                Procedure: percutaneous drain placement  Dx: abdominal abscess  Imaging: CT abdomen done 9/12 in epic  Ordering: Dr. [name] [phone]  Please review.  Thanks,  Fara Boros

## 2020-10-17 ENCOUNTER — Ambulatory Visit (HOSPITAL_COMMUNITY)
Admission: RE | Admit: 2020-10-17 | Discharge: 2020-10-17 | Disposition: A | Discharge status: Home / Self Care | Payer: Medicare FFS | Source: Ambulatory Visit | Attending: Sports Medicine | Admitting: Sports Medicine

## 2020-10-17 ENCOUNTER — Encounter (HOSPITAL_COMMUNITY): Payer: Self-pay

## 2020-10-17 ENCOUNTER — Other Ambulatory Visit: Payer: Self-pay

## 2020-10-17 DIAGNOSIS — R109 Unspecified abdominal pain: Secondary | ICD-10-CM | POA: Diagnosis present

## 2020-10-17 DIAGNOSIS — K810 Acute cholecystitis: Secondary | ICD-10-CM | POA: Insufficient documentation

## 2020-10-17 LAB — CBC WITH DIFFERENTIAL/PLATELET
Abs Immature Granulocytes: 0.34 10*3/uL — ABNORMAL HIGH (ref 0.00–0.07)
Basophils Absolute: 0.1 10*3/uL (ref 0.0–0.1)
Basophils Relative: 1 %
Eosinophils Absolute: 0.3 10*3/uL (ref 0.0–0.5)
Eosinophils Relative: 2 %
HCT: 38.9 % — ABNORMAL LOW (ref 39.0–52.0)
Hemoglobin: 12.7 g/dL — ABNORMAL LOW (ref 13.0–17.0)
Immature Granulocytes: 2 %
Lymphocytes Relative: 10 %
Lymphs Abs: 1.9 10*3/uL (ref 0.7–4.0)
MCH: 30 pg (ref 26.0–34.0)
MCHC: 32.6 g/dL (ref 30.0–36.0)
MCV: 91.7 fL (ref 80.0–100.0)
Monocytes Absolute: 1.9 10*3/uL — ABNORMAL HIGH (ref 0.1–1.0)
Monocytes Relative: 10 %
Neutro Abs: 15.1 10*3/uL — ABNORMAL HIGH (ref 1.7–7.7)
Neutrophils Relative %: 75 %
Platelets: 372 10*3/uL (ref 150–400)
RBC: 4.24 MIL/uL (ref 4.22–5.81)
RDW: 17.8 % — ABNORMAL HIGH (ref 11.5–15.5)
WBC: 19.6 10*3/uL — ABNORMAL HIGH (ref 4.0–10.5)
nRBC: 0 % (ref 0.0–0.2)

## 2020-10-17 LAB — PROTIME-INR
INR: 1.3 — ABNORMAL HIGH (ref 0.8–1.2)
Prothrombin Time: 16.2 seconds — ABNORMAL HIGH (ref 11.4–15.2)

## 2020-10-17 LAB — GLUCOSE, CAPILLARY: Glucose-Capillary: 110 mg/dL — ABNORMAL HIGH (ref 70–99)

## 2020-10-17 MED ORDER — HYDROCODONE-ACETAMINOPHEN 5-325 MG PO TABS: 1.0000 | ORAL_TABLET | ORAL | Status: DC | PRN

## 2020-10-17 MED ORDER — MIDAZOLAM HCL 2 MG/2ML IJ SOLN
INTRAMUSCULAR | Status: DC | PRN
  Administered 2020-10-17: 1 mg via INTRAVENOUS
  Administered 2020-10-17: .5 mg via INTRAVENOUS

## 2020-10-17 MED ORDER — SODIUM CHLORIDE 0.9% FLUSH: 5.0000 mL | Freq: Three times a day (TID) | INTRAVENOUS | Status: DC

## 2020-10-17 MED ORDER — FENTANYL CITRATE (PF) 100 MCG/2ML IJ SOLN
INTRAMUSCULAR | Status: DC | PRN
  Administered 2020-10-17 (×2): 25 ug via INTRAVENOUS

## 2020-10-17 MED ORDER — FENTANYL CITRATE (PF) 100 MCG/2ML IJ SOLN
INTRAMUSCULAR | Status: AC
  Filled 2020-10-17: qty 4

## 2020-10-17 MED ORDER — MIDAZOLAM HCL 2 MG/2ML IJ SOLN
INTRAMUSCULAR | Status: AC
  Filled 2020-10-17: qty 4

## 2020-10-17 MED ORDER — SODIUM CHLORIDE 0.9 % IV SOLN: INTRAVENOUS | Status: DC

## 2020-10-17 MED ORDER — LIDOCAINE HCL 1 % IJ SOLN
INTRAMUSCULAR | Status: AC
  Filled 2020-10-17: qty 10

## 2020-10-17 NOTE — H&P (Signed)
Chief Complaint: Patient was seen in consultation today for percutaneous drain placement for abdominal abscess Steven Patton  Referring Physician(s): Steven Patton  Supervising Physician: Arne Cleveland  Patient Status: [facility] - Out-pt  History of Present Illness: Steven Patton is a 85 y.o. male PMH of arthritis, cataracts, DM type II, HTN, bilateral lower extremity weakness, macular degeneration of left eye, OSA.  Patient had cholecystectomy in February 2022 and has since had multiple fluid collections, infections and multiple percutaneous drain placements.  Patient continues to have right flank pain related to these fluid collections.  CT 10/14/2020 shows large subhepatic fluid collection consistent with abscess.  IMPRESSION: 1. Interval enlargement of an intra-abdominal fluid collection in the hepatorenal recess, measuring 6.6 x 4.9 cm, previously 3.2 x 3.0 cm when measured similarly. There is additional overlying subcutaneous fat stranding without definite discrete fluid collection as well as a drainage catheter tract. Findings are consistent with worsened abscess. 2. Status post cholecystectomy.  Patient was referred by Dr. Dianah Field to have drain placed for recurrent subhepatic abscess.     Past Medical History:  Diagnosis Date  . Arthritis   . Cataract   . Diabetes mellitus without complication ([place])   . Hypertension    takes Amlodipine,Lisinopril,and Metoprolol daily  . Lower extremity weakness    Bilateral  . Macular degeneration, left eye   . Sleep apnea    2008.Marland KitchenMarland KitchenWEARS CPAP    Past Surgical History:  Procedure Laterality Date  . BACK SURGERY    . CARDIAC CATHETERIZATION  06-10-12  . COLONOSCOPY    . EYE SURGERY     CATARACT RIGHT EYE  . KNEE SURGERY    . LUMBAR LAMINECTOMY/DECOMPRESSION MICRODISCECTOMY N/A 11/02/2012   Procedure: LUMBAR LAMINECTOMY/DECOMPRESSION MICRODISCECTOMY 2 LEVEL;  Surgeon: Sinclair Ship, MD;  Location: [place];  Service: Orthopedics;  Laterality: N/A;  Lumbar 4-5,lumbar 5-sacrum 1 decompression  . LUMBAR LAMINECTOMY/DECOMPRESSION MICRODISCECTOMY N/A 09/08/2017   Procedure: THORACIC 11-12 DECOMPRESSION;  Surgeon: Phylliss Bob, MD;  Location: [place];  Service: Orthopedics;  Laterality: N/A;  . TONSILLECTOMY    . TOTAL HIP ARTHROPLASTY Left 01/06/2013   Procedure: LEFT TOTAL HIP ARTHROPLASTY-Posterior approach;  Surgeon: Alta Corning, MD;  Location: [place];  Service: Orthopedics;  Laterality: Left;  . TOTAL HIP ARTHROPLASTY Right 04/21/2013   Procedure: TOTAL HIP ARTHROPLASTY ANTERIOR APPROACH;  Surgeon: Alta Corning, MD;  Location: [place];  Service: Orthopedics;  Laterality: Right;  . TOTAL KNEE ARTHROPLASTY Left 09/16/2012   Procedure: TOTAL KNEE ARTHROPLASTY;  Surgeon: Alta Corning, MD;  Location: [place];  Service: Orthopedics;  Laterality: Left;    Allergies: Patient has no known allergies.  Medications: Prior to Admission medications   Medication Sig Start Date End Date Taking? Authorizing Provider  acetaminophen (TYLENOL) 500 MG tablet Take 1,000 mg by mouth every 6 (six) hours as needed for moderate pain or headache.   Yes [provider]  allopurinol (ZYLOPRIM) 300 MG tablet TAKE 1 TABLET TWICE DAILY 08/22/20  Yes Steven Decamp, MD  amoxicillin-clavulanate (AUGMENTIN) 875-125 MG tablet Take 1 tablet by mouth 2 (two) times daily for 10 days. 10/14/20 10/24/20 Yes Steven Decamp, MD  atorvastatin (LIPITOR) 40 MG tablet Take 1 tablet (40 mg total) by mouth at bedtime. 04/29/20  Yes Steven Decamp, MD  diphenhydrAMINE (BENADRYL) 25 MG tablet Take 25 mg by mouth at bedtime.   Yes [provider]  meloxicam (MOBIC) 15 MG tablet TAKE 1 TABLET EVERY DAY 11/28/19  Yes Thekkekandam,  Gwen Her, MD  metFORMIN (GLUCOPHAGE) 500 MG tablet TAKE 1 TABLET EVERY DAY WITH BREAKFAST 06/20/20  Yes Steven Decamp, MD  metroNIDAZOLE (FLAGYL) 500 MG tablet Take 1 tablet  (500 mg total) by mouth 2 (two) times daily. 10/14/20  Yes Steven Decamp, MD  Multiple Vitamins-Minerals (ADULT GUMMY PO) Take 3 capsules by mouth daily.   Yes [provider]  spironolactone (ALDACTONE) 25 MG tablet Take 1 tablet (25 mg total) by mouth daily. 06/20/20  Yes Steven Decamp, MD  torsemide (DEMADEX) 10 MG tablet Take 1 tablet (10 mg total) by mouth daily. 07/18/19  Yes Steven Decamp, MD  AMBULATORY NON FORMULARY MEDICATION Knee-high, firm compression, custom made, graduated compression stockings. Apply to lower extremities. 10/26/16   Steven Decamp, MD  AMBULATORY NON FORMULARY MEDICATION Light weight wheel chair. 12/26/19   Steven Decamp, MD  blood glucose meter kit and supplies KIT Dispense based on patient and insurance preference. Use up to four times daily as directed. (FOR ICD-9 250.00, 250.01). 12/14/17   Steven Decamp, MD  carvedilol (COREG) 25 MG tablet TAKE 1 TABLET TWICE DAILY WITH A MEAL Patient not taking: No sig reported 08/31/19   Steven Decamp, MD  cyclobenzaprine (FLEXERIL) 5 MG tablet One half to one tab PO qHS, then increase gradually to one tab TID. Patient not taking: No sig reported 03/19/20   Steven Decamp, MD  dexamethasone (DECADRON) 4 MG tablet Take 1 tablet (4 mg total) by mouth 3 (three) times daily. Patient not taking: No sig reported 06/07/20   Steven Decamp, MD  gabapentin (NEURONTIN) 600 MG tablet TAKE 1 TABLET THREE TIMES DAILY Patient not taking: No sig reported 01/09/20   Steven Decamp, MD  nitroGLYCERIN (NITROSTAT) 0.4 MG SL tablet Place 1 tablet (0.4 mg total) under the tongue every 5 (five) minutes as needed for chest pain (or tightness). 05/24/17   Steven Decamp, MD  torsemide (DEMADEX) 20 MG tablet TAKE 1 TABLET EVERY DAY Patient not taking: No sig reported 03/14/20   Steven Decamp, MD     Family History  Problem Relation Age of Onset  .  Cancer Daughter   . Alzheimer's disease Mother   . Lung cancer Father     Social History   Socioeconomic History  . Marital status: Married    Spouse name: Not on file  . Number of children: 5  . Years of education: Not on file  . Highest education level: Not on file  Occupational History  . Not on file  Tobacco Use  . Smoking status: Former    Packs/day: 1.00    Years: 20.00    Pack years: 20.00    Types: Cigarettes    Quit date: 01/04/1993    Years since quitting: 27.8  . Smokeless tobacco: Former    Types: Chew    Quit date: 01/04/1993  . Tobacco comments:    quit 41yr ago  Vaping Use  . Vaping Use: Never used  Substance and Sexual Activity  . Alcohol use: No  . Drug use: No  . Sexual activity: Not Currently    Birth control/protection: None  Other Topics Concern  . Not on file  Social History Narrative  . Not on file   Social Determinants of Health   Financial Resource Strain: Not on file  Food Insecurity: Not on file  Transportation Needs: Not on file  Physical Activity: Not on file  Stress: Not on file  Social Connections: Not on file    Review of Systems: A 12 point ROS discussed and pertinent positives are indicated in the HPI above.  All other systems are negative.  Review of Systems  Constitutional:  Negative for chills and fatigue.  Respiratory:  Negative for cough and shortness of breath.   Cardiovascular:  Negative for chest pain and leg swelling.  Gastrointestinal:  Positive for abdominal pain.       Pt c/o of right flank pain at the site of persistent abscesses    Vital Signs: BP 117/75 (BP Location: Right Arm)   Pulse 90   Temp 98 F (36.7 C) (Oral)   Ht $R'5\' 9"'Hs$  (1.753 m)   Wt 270 lb (122.5 kg)   SpO2 97%   BMI 39.87 kg/m   Physical Exam Constitutional:      Appearance: Normal appearance.  HENT:     Head: Normocephalic and atraumatic.     Mouth/Throat:     Mouth: Mucous membranes are moist.     Pharynx: Oropharynx is clear.   Cardiovascular:     Rate and Rhythm: Normal rate and regular rhythm.     Heart sounds: No murmur heard. Pulmonary:     Effort: No respiratory distress.     Breath sounds: No stridor. No wheezing, rhonchi or rales.  Abdominal:     Palpations: Abdomen is soft.     Tenderness: There is no abdominal tenderness. There is no guarding.  Musculoskeletal:     Right lower leg: No edema.     Left lower leg: No edema.  Neurological:     Mental Status: He is alert and oriented to person, place, and time.  Psychiatric:        Mood and Affect: Mood normal.        Behavior: Behavior normal.        Thought Content: Thought content normal.        Judgment: Judgment normal.    Imaging: CT ABDOMEN PELVIS W CONTRAST  Result Date: 10/14/2020 CLINICAL DATA:  History of gangrenous cholecystitis, status post cholecystectomy, percutaneous drain placement, drainage catheter removed, persistent perilymph drainage at drain site EXAM: CT ABDOMEN AND PELVIS WITH CONTRAST TECHNIQUE: Multidetector CT imaging of the abdomen and pelvis was performed using the standard protocol following bolus administration of intravenous contrast. CONTRAST:  18mL OMNIPAQUE IOHEXOL 300 MG/ML SOLN, additional oral enteric contrast COMPARISON:  06/10/2020 FINDINGS: Lower chest: No acute abnormality. Hepatobiliary: No focal liver abnormality is seen. Status post cholecystectomy. No biliary dilatation. Pancreas: Unremarkable. No pancreatic ductal dilatation or surrounding inflammatory changes. Spleen: Normal in size without significant abnormality. Adrenals/Urinary Tract: Adrenal glands are unremarkable. Kidneys are normal, without renal calculi, solid lesion, or hydronephrosis. Bladder is unremarkable. Stomach/Bowel: Stomach is within normal limits. Appendix appears normal. No evidence of bowel wall thickening, distention, or inflammatory changes. Vascular/Lymphatic: Aortic atherosclerosis. No enlarged abdominal or pelvic lymph nodes.  Reproductive: No mass or other significant abnormality. Other: No abdominal wall hernia. No abdominopelvic ascites. Interval enlargement of an intra-abdominal fluid collection in the hepatorenal recess, measuring 6.6 x 4.9 cm, previously 3.2 x 3.0 cm when measured similarly (series 2, image 30). There is additional overlying subcutaneous fat stranding without definite discrete fluid collection (series 2, image 27) as well as a drainage catheter tract (series 2, image 29). Musculoskeletal: No acute or significant osseous findings. Status post bilateral hip total arthroplasty. IMPRESSION: 1. Interval enlargement of an intra-abdominal fluid collection in the hepatorenal recess, measuring 6.6 x 4.9 cm, previously 3.2  x 3.0 cm when measured similarly. There is additional overlying subcutaneous fat stranding without definite discrete fluid collection as well as a drainage catheter tract. Findings are consistent with worsened abscess. 2. Status post cholecystectomy. Aortic Atherosclerosis (ICD10-I70.0). Electronically Signed   By: Eddie Candle M.D.   On: 10/14/2020 15:05    Labs:  CBC: Recent Labs    03/16/20 1232 05/31/20 0000  WBC 16.7* 12.0*  HGB 15.6 14.0  HCT 49.4 44.4  PLT 132* 188    COAGS: Recent Labs    10/17/20 1120  INR 1.3*    BMP: Recent Labs    03/16/20 1232 05/31/20 0000 10/14/20 1422  NA 138 138  --   K 4.4 4.5  --   CL 100 96*  --   CO2 25 28  --   GLUCOSE 142* 125*  --   BUN 21 22  --   CALCIUM 9.8 10.0  --   CREATININE 1.59* 1.52* 1.20*  GFRNONAA 43*  --   --     LIVER FUNCTION TESTS: Recent Labs    03/16/20 1637 05/31/20 0000  BILITOT 1.2 1.0  AST 18 27  ALT 23 28  ALKPHOS 48  --   PROT 6.3* 8.3*  ALBUMIN 3.3*  --     TUMOR MARKERS: No results for input(s): AFPTM, CEA, CA199, CHROMGRNA in the last 8760 hours.  Assessment and Plan:  History of arthritis, cataracts, DM type II, HTN, bilateral lower extremity weakness, macular degeneration of left  eye, OSA.  Patient had cholecystectomy iFebruary 2022 and has since had multiple fluid collections, infections and multiple percutaneous drain placements.  Patient continues to have right flank pain related to these fluid collections.  CT 10/14/2020 shows large subhepatic fluid collection consistent with abscess.   Patient patient states he is NPO per order Vital signs stable Patient is calm, pleasant, alert and oriented. He is in no apparent distress INR today 1.3.  No other labs obtained.  Risks and benefits discussed with the patient including bleeding, infection, damage to adjacent structures, bowel perforation/fistula connection, and sepsis.  All of the patient's questions were answered, patient is agreeable to proceed. Consent signed and in chart.   Thank you for this interesting consult.  I greatly enjoyed meeting Mearl Olver and look forward to participating in their care.  A copy of this report was sent to the requesting provider on this date.  Electronically Signed: Tyson Alias, NP 10/17/2020, 12:05 PM   I spent a total of 30 minutes in face to face in clinical consultation, greater than 50% of which was counseling/coordinating care for percutaneous drain placement for abdominal abscess.

## 2020-10-17 NOTE — Procedures (Signed)
  Procedure: CT guided RUQ abscess drain catheter placement 79f EBL:   minimal Complications:  none immediate  See full dictation in [employer].  Thora Lance MD Main # [phone] Pager  [phone]

## 2020-10-18 ENCOUNTER — Encounter: Payer: Self-pay | Admitting: Sports Medicine

## 2020-10-18 ENCOUNTER — Ambulatory Visit (INDEPENDENT_AMBULATORY_CARE_PROVIDER_SITE_OTHER): Payer: Medicare FFS | Admitting: Sports Medicine

## 2020-10-18 DIAGNOSIS — K810 Acute cholecystitis: Secondary | ICD-10-CM | POA: Diagnosis not present

## 2020-10-18 LAB — WOUND CULTURE
MICRO NUMBER:: 12364085
SPECIMEN QUALITY:: ADEQUATE

## 2020-10-18 LAB — POCT URINALYSIS DIP (CLINITEK)
Bilirubin, UA: NEGATIVE
Blood, UA: NEGATIVE
Glucose, UA: NEGATIVE mg/dL
Ketones, POC UA: NEGATIVE mg/dL
Leukocytes, UA: NEGATIVE
Nitrite, UA: NEGATIVE
POC PROTEIN,UA: NEGATIVE
Spec Grav, UA: 1.02 (ref 1.010–1.025)
Urobilinogen, UA: 0.2 E.U./dL
pH, UA: 7 (ref 5.0–8.0)

## 2020-10-18 NOTE — Progress Notes (Signed)
    Procedures performed today:    None.  Independent interpretation of notes and tests performed by another provider:   None.  Brief History, Exam, Impression, and Recommendations:    Gangrenous cholecystitis Me he returns, he is a pleasant 85 year old male, he has a fairly complicated history of gangrenous cholecystitis postcholecystectomy back in February with a protracted course of fluid collections, infections, multiple percutaneous drain placements but no definitive washout. Cultures in the past have grown out Terrisporobacter glycolycus, formally known as Clostridium glycolycus.  I saw him earlier this week with increasing abdominal pain as well as purulence at the site of the percutaneous drain removal.  We obtained a culture, still waiting on any speciation. We ultimately got a CT scan with IV contrast that showed a fairly large subhepatic abscess, I sent him for a CT-guided drain placement, a large amount of purulent fluid came out immediately and has been continuing to drain. Urine was clear and urinalysis was unremarkable today. He does feel better. He is on metronidazole and Augmentin currently. We are still try to get him in with [facility] surgery, they do understand that this is somewhat outside my specialty.  Decision was made today not to hospitalize.  ___________________________________________ [name]. [name], M.D., ABFM., CAQSM. Primary Care and Sports Medicine [facility]  Adjunct Instructor of Family Medicine  [employer]

## 2020-10-18 NOTE — Patient Instructions (Signed)
Drain Care  1. Site care: clean around site as needed with warm water and gauze or Q-Tip. Do NOT use desiccating products (i.e. things that dry out your skin) like Hydrogen peroxide, anti-microbial soaps, or alcohol 2. Flush each drain 1 x daily with 5 cc saline as instructed using sterile technique a) disconnect the clear tube attached to leg bag b) wipe the opening of the drain (white tube with white hub) with alcohol c) connect syringe and flush INTO the patient d) disconnect the syringe and re-connect the clear tube to the leg bag (do this gently; if you attach too tightly then it will be difficult to disconnect at the time of the next flush)

## 2020-10-18 NOTE — Assessment & Plan Note (Signed)
Me he returns, he is a pleasant 85 year old male, he has a fairly complicated history of gangrenous cholecystitis postcholecystectomy back in February with a protracted course of fluid collections, infections, multiple percutaneous drain placements but no definitive washout. Cultures in the past have grown out Terrisporobacter glycolycus, formally known as Clostridium glycolycus.  I saw him earlier this week with increasing abdominal pain as well as purulence at the site of the percutaneous drain removal.  We obtained a culture, still waiting on any speciation. We ultimately got a CT scan with IV contrast that showed a fairly large subhepatic abscess, I sent him for a CT-guided drain placement, a large amount of purulent fluid came out immediately and has been continuing to drain. Urine was clear and urinalysis was unremarkable today. He does feel better. He is on metronidazole and Augmentin currently. We are still try to get him in with [facility] surgery, they do understand that this is somewhat outside my specialty.

## 2020-10-22 LAB — AEROBIC/ANAEROBIC CULTURE W GRAM STAIN (SURGICAL/DEEP WOUND)

## 2020-10-28 ENCOUNTER — Ambulatory Visit: Payer: Medicare FFS | Admitting: Sports Medicine

## 2020-10-31 ENCOUNTER — Other Ambulatory Visit: Payer: Self-pay | Admitting: Sports Medicine

## 2020-11-08 ENCOUNTER — Other Ambulatory Visit: Payer: Self-pay | Admitting: General Surgery

## 2020-11-08 DIAGNOSIS — Z9049 Acquired absence of other specified parts of digestive tract: Secondary | ICD-10-CM

## 2020-11-13 ENCOUNTER — Other Ambulatory Visit: Payer: Self-pay | Admitting: Sports Medicine

## 2020-11-13 DIAGNOSIS — I10 Essential (primary) hypertension: Secondary | ICD-10-CM

## 2020-11-18 ENCOUNTER — Encounter: Payer: Self-pay | Admitting: Internal Medicine

## 2020-11-18 ENCOUNTER — Other Ambulatory Visit: Payer: Self-pay

## 2020-11-18 ENCOUNTER — Ambulatory Visit (INDEPENDENT_AMBULATORY_CARE_PROVIDER_SITE_OTHER): Payer: Medicare FFS | Admitting: Internal Medicine

## 2020-11-18 VITALS — BP 113/72 | HR 73 | Temp 97.3°F | Resp 16

## 2020-11-18 DIAGNOSIS — T8143XA Infection following a procedure, organ and space surgical site, initial encounter: Secondary | ICD-10-CM

## 2020-11-18 DIAGNOSIS — K651 Peritoneal abscess: Secondary | ICD-10-CM | POA: Insufficient documentation

## 2020-11-18 DIAGNOSIS — K810 Acute cholecystitis: Secondary | ICD-10-CM

## 2020-11-18 MED ORDER — LINEZOLID 600 MG PO TABS: 600.0000 mg | ORAL_TABLET | Freq: Two times a day (BID) | ORAL | 1 refills | Status: DC

## 2020-11-18 NOTE — Progress Notes (Signed)
[place] for Infectious Disease      Reason for Consult: recurrent gall bladder abscess   Referring Physician: Dr. Barry Dienes    Patient ID: Steven Patton, male    DOB: May 04, 1935, 85 y.o.   MRN: [id number]  HPI:   He is here for evaluation of ongoing issues with an abscess following his lap cholecystectomy.   He underwent cholecystectomy at [facility] in February 2022 for gangrenous cholecystitis and after that has had 4 episodes of abscess requiring drain placement.  Each time it appears that the abscess is resolved, drained and it is removed but returns.  Most recently he developed pain and required another drain placed by IR.  No fever, no chills.  Culture from the most recent procedure with Corynebacterium striatum. No rash.    About 10 cc drainage present and not much draining now.    Past Medical History:  Diagnosis Date   Arthritis    Cataract    Diabetes mellitus without complication ([place])    Hypertension    takes Amlodipine,Lisinopril,and Metoprolol daily   Lower extremity weakness    Bilateral   Macular degeneration, left eye    Sleep apnea    2008.Marland KitchenMarland KitchenWEARS CPAP    Prior to Admission medications   Medication Sig Start Date End Date Taking? Authorizing Provider  acetaminophen (TYLENOL) 500 MG tablet Take 1,000 mg by mouth every 6 (six) hours as needed for moderate pain or headache.    [provider]  allopurinol (ZYLOPRIM) 300 MG tablet TAKE 1 TABLET TWICE DAILY 08/22/20   Silverio Decamp, MD  AMBULATORY NON FORMULARY MEDICATION Knee-high, firm compression, custom made, graduated compression stockings. Apply to lower extremities. 10/26/16   Silverio Decamp, MD  AMBULATORY NON FORMULARY MEDICATION Light weight wheel chair. 12/26/19   Silverio Decamp, MD  atorvastatin (LIPITOR) 40 MG tablet TAKE 1 TABLET AT BEDTIME 11/13/20   Silverio Decamp, MD  blood glucose meter kit and supplies KIT Dispense based on patient and insurance  preference. Use up to four times daily as directed. (FOR ICD-9 250.00, 250.01). 12/14/17   Silverio Decamp, MD  carvedilol (COREG) 25 MG tablet TAKE 1 TABLET TWICE DAILY WITH A MEAL 08/31/19   Silverio Decamp, MD  cyclobenzaprine (FLEXERIL) 5 MG tablet One half to one tab PO qHS, then increase gradually to one tab TID. 03/19/20   Silverio Decamp, MD  dexamethasone (DECADRON) 4 MG tablet Take 1 tablet (4 mg total) by mouth 3 (three) times daily. 06/07/20   Silverio Decamp, MD  diphenhydrAMINE (BENADRYL) 25 MG tablet Take 25 mg by mouth at bedtime.    [provider]  gabapentin (NEURONTIN) 600 MG tablet TAKE 1 TABLET THREE TIMES DAILY 01/09/20   Silverio Decamp, MD  meloxicam (MOBIC) 15 MG tablet TAKE 1 TABLET EVERY DAY 10/31/20   Silverio Decamp, MD  metFORMIN (GLUCOPHAGE) 500 MG tablet TAKE 1 TABLET EVERY DAY WITH BREAKFAST 06/20/20   Silverio Decamp, MD  metroNIDAZOLE (FLAGYL) 500 MG tablet Take 1 tablet (500 mg total) by mouth 2 (two) times daily. 10/14/20   Silverio Decamp, MD  Multiple Vitamins-Minerals (ADULT GUMMY PO) Take 3 capsules by mouth daily.    [provider]  nitroGLYCERIN (NITROSTAT) 0.4 MG SL tablet Place 1 tablet (0.4 mg total) under the tongue every 5 (five) minutes as needed for chest pain (or tightness). 05/24/17   Silverio Decamp, MD  spironolactone (ALDACTONE) 25 MG tablet Take 1 tablet (  25 mg total) by mouth daily. 06/20/20   Silverio Decamp, MD  torsemide (DEMADEX) 10 MG tablet Take 1 tablet (10 mg total) by mouth daily. 07/18/19   Silverio Decamp, MD  torsemide (DEMADEX) 20 MG tablet TAKE 1 TABLET EVERY DAY 03/14/20   Silverio Decamp, MD    No Known Allergies  Social History   Tobacco Use   Smoking status: Former    Packs/day: 1.00    Years: 20.00    Pack years: 20.00    Types: Cigarettes    Quit date: 01/04/1993    Years since quitting: 27.8   Smokeless tobacco: Former     Types: Chew    Quit date: 01/04/1993   Tobacco comments:    quit 9yr ago  Vaping Use   Vaping Use: Never used  Substance Use Topics   Alcohol use: No   Drug use: No    Family History  Problem Relation Age of Onset   Cancer Daughter    Alzheimer's disease Mother    Lung cancer Father     Review of Systems  Constitutional: negative for fevers and chills Integument/breast: negative for rash All other systems reviewed and are negative    Constitutional: in no apparent distress There were no vitals filed for this visit. EYES: anicteric ENMT: no thrush Cardiovascular: Cor RRR GI: soft Drains: left gallbladder drain in place, brownish fluid noted.   Labs: Lab Results  Component Value Date   WBC 19.6 (H) 10/17/2020   HGB 12.7 (L) 10/17/2020   HCT 38.9 (L) 10/17/2020   MCV 91.7 10/17/2020   PLT 372 10/17/2020    Lab Results  Component Value Date   CREATININE 1.20 (A) 10/14/2020   BUN 22 05/31/2020   NA 138 05/31/2020   K 4.5 05/31/2020   CL 96 (L) 05/31/2020   CO2 28 05/31/2020    Lab Results  Component Value Date   ALT 28 05/31/2020   AST 27 05/31/2020   ALKPHOS 48 03/16/2020   BILITOT 1.0 05/31/2020   INR 1.3 (H) 10/17/2020     Assessment: he has recurrent abscess development despite adequate drainage over and over.  My suspicion is that there is some foreign nidus such as a stone or something else causing the recurrent infections.  I doubt it is simply the Corynebacterium causing the issue, though it would not be covered by his previous antibiotics well (Augmentin and metronidazole).  Will see what the MRI reveals and in the meantime will treat with linezolid which has good coverage for this organism.     Plan: 1)  linezollid 2) baseline platelets, CMP 3) return in 2 weeks after MRI and after follow up with Dr. BBarry Dienes

## 2020-11-19 LAB — CBC WITH DIFFERENTIAL/PLATELET
Absolute Monocytes: 1030 cells/uL — ABNORMAL HIGH (ref 200–950)
Basophils Absolute: 89 cells/uL (ref 0–200)
Basophils Relative: 0.9 %
Eosinophils Absolute: 327 cells/uL (ref 15–500)
Eosinophils Relative: 3.3 %
HCT: 41.9 % (ref 38.5–50.0)
Hemoglobin: 13.5 g/dL (ref 13.2–17.1)
Lymphs Abs: 2277 cells/uL (ref 850–3900)
MCH: 30.6 pg (ref 27.0–33.0)
MCHC: 32.2 g/dL (ref 32.0–36.0)
MCV: 95 fL (ref 80.0–100.0)
MPV: 9.8 fL (ref 7.5–12.5)
Monocytes Relative: 10.4 %
Neutro Abs: 6178 cells/uL (ref 1500–7800)
Neutrophils Relative %: 62.4 %
Platelets: 143 10*3/uL (ref 140–400)
RBC: 4.41 10*6/uL (ref 4.20–5.80)
RDW: 16.2 % — ABNORMAL HIGH (ref 11.0–15.0)
Total Lymphocyte: 23 %
WBC: 9.9 10*3/uL (ref 3.8–10.8)

## 2020-11-19 LAB — COMPLETE METABOLIC PANEL WITH GFR
AG Ratio: 1.5 (calc) (ref 1.0–2.5)
ALT: 14 U/L (ref 9–46)
AST: 19 U/L (ref 10–35)
Albumin: 4 g/dL (ref 3.6–5.1)
Alkaline phosphatase (APISO): 51 U/L (ref 35–144)
BUN/Creatinine Ratio: 16 (calc) (ref 6–22)
BUN: 23 mg/dL (ref 7–25)
CO2: 24 mmol/L (ref 20–32)
Calcium: 9.8 mg/dL (ref 8.6–10.3)
Chloride: 104 mmol/L (ref 98–110)
Creat: 1.42 mg/dL — ABNORMAL HIGH (ref 0.70–1.22)
Globulin: 2.7 g/dL (calc) (ref 1.9–3.7)
Glucose, Bld: 107 mg/dL — ABNORMAL HIGH (ref 65–99)
Potassium: 4.3 mmol/L (ref 3.5–5.3)
Sodium: 141 mmol/L (ref 135–146)
Total Bilirubin: 0.3 mg/dL (ref 0.2–1.2)
Total Protein: 6.7 g/dL (ref 6.1–8.1)
eGFR: 49 mL/min/{1.73_m2} — ABNORMAL LOW (ref >=60)

## 2020-11-25 ENCOUNTER — Ambulatory Visit (INDEPENDENT_AMBULATORY_CARE_PROVIDER_SITE_OTHER): Payer: Medicare FFS | Admitting: Sports Medicine

## 2020-11-25 DIAGNOSIS — Z0000 Encounter for general adult medical examination without abnormal findings: Secondary | ICD-10-CM

## 2020-11-25 NOTE — Patient Instructions (Addendum)
MEDICARE ANNUAL WELLNESS VISIT Health Maintenance Summary and Written Plan of Care  [name] ,  Thank you for allowing me to perform your Medicare Annual Wellness Visit and for your ongoing commitment to your health.   Health Maintenance & Immunization History Health Maintenance  Topic Date Due   INFLUENZA VACCINE  11/26/2020 (Originally 09/02/2020)   FOOT EXAM  11/26/2020 (Originally 10/24/2019)   HEMOGLOBIN A1C  11/26/2020 (Originally 03/28/2020)   URINE MICROALBUMIN  11/26/2020 (Originally 09/25/2020)   COVID-19 Vaccine (3 - Pfizer risk series) 12/11/2020 (Originally 05/31/2019)   Zoster Vaccines- Shingrix (1 of 2) 02/25/2021 (Originally 03/08/1954)   OPHTHALMOLOGY EXAM  04/17/2021   TETANUS/TDAP  01/03/2022   Pneumonia Vaccine 99+ Years old  Completed   HPV VACCINES  Aged Out   Immunization History  Administered Date(s) Administered   Fluad Quad(high Dose 65+) 10/07/2018, 11/13/2019   Influenza, High Dose Seasonal PF 10/26/2016, 10/01/2017   Influenza, Seasonal, Injecte, Preservative Fre 01/04/2012   Influenza,inj,Quad PF,6+ Mos 11/15/2012, 04/05/2014, 03/04/2015   PFIZER(Purple Top)SARS-COV-2 Vaccination 04/02/2019, 05/03/2019   Pneumococcal Conjugate-13 08/10/2014   Pneumococcal Polysaccharide-23 11/03/2010   Tdap 01/04/2012   Zoster, Live 10/04/2011    These are the patient goals that we discussed:  Goals Addressed               This Visit's Progress     Patient Stated (pt-stated)        11/25/2020 AWV Goal: Exercise for General Health  Patient will verbalize understanding of the benefits of increased physical activity: Exercising regularly is important. It will improve your overall fitness, flexibility, and endurance. Regular exercise also will improve your overall health. It can help you control your weight, reduce stress, and improve your bone density. Over the next year, patient will increase physical activity as tolerated with a goal of at least 150 minutes  of moderate physical activity per week.  You can tell that you are exercising at a moderate intensity if your heart starts beating faster and you start breathing faster but can still hold a conversation. Moderate-intensity exercise ideas include: Walking 1 mile (1.6 km) in about 15 minutes Biking Hiking Golfing Dancing Water aerobics Patient will verbalize understanding of everyday activities that increase physical activity by providing examples like the following: Yard work, such as: Insurance underwriter Gardening Washing windows or floors Patient will be able to explain general safety guidelines for exercising:  Before you start a new exercise program, talk with your health care provider. Do not exercise so much that you hurt yourself, feel dizzy, or get very short of breath. Wear comfortable clothes and wear shoes with good support. Drink plenty of water while you exercise to prevent dehydration or heat stroke. Work out until your breathing and your heartbeat get faster.          This is a list of Health Maintenance Items that are overdue or due now: Influenza vaccine Shingles vaccine Foot exam Hemoglobin A1C Urine ACR  Orders/Referrals Placed Today: No orders of the defined types were placed in this encounter.  (Contact our referral department at [phone] if you have not spoken with someone about your referral appointment within the next 5 days)    Follow-up Plan Follow-up with Monica Becton, MD as planned At your next in-office visit on Wednesday, we can do the foot exam, hemoglobin A1c, urine ACR and the flu vaccine, once you discuss it with Dr. Karie Schwalbe.  Shingles vaccine can be done at your pharmacy. Medicare wellness visit in one year.  Patient will access AVS on my chart.

## 2020-11-25 NOTE — Progress Notes (Signed)
MEDICARE ANNUAL WELLNESS VISIT  11/25/2020  Telephone Visit Disclaimer This Medicare AWV was conducted by telephone due to national recommendations for restrictions regarding the COVID-19 Pandemic (e.g. social distancing).  I verified, using two identifiers, that I am speaking with Steven Patton or their authorized healthcare agent. I discussed the limitations, risks, security, and privacy concerns of performing an evaluation and management service by telephone and the potential availability of an in-person appointment in the future. The patient expressed understanding and agreed to proceed.  Location of Patient: Home Location of Provider (nurse):  Provider home  Subjective:    Steven Patton is a 85 y.o. male patient of Thekkekandam, Steven Her, MD who had a Medicare Annual Wellness Visit today via telephone. Steven Patton is Retired and lives with their daughter. he has 5 children. he reports that he is socially active and does interact with friends/family regularly. he is not physically active and enjoys watching television.  Patient Care Team: Steven Decamp, MD as PCP - General (Family Medicine) Steven Perla, MD as PCP - Cardiology (Cardiology)  Advanced Directives 11/25/2020 10/17/2020 06/06/2019 04/24/2019 12/13/2017 09/03/2017 04/21/2013  Does Patient Have a Medical Advance Directive? Yes Yes No No Yes Yes Patient has advance directive, copy in chart  Type of Advance Directive Living will;Healthcare Power of [place];Living will - - Living will Living will [place];Living will  Does patient want to make changes to medical advance directive? No - Patient declined - No - Patient declined No - Patient declined - No - Patient declined No change requested  Copy of [place] in Chart? No - copy requested No - copy requested - - - - -  Would patient like information on creating a medical advance directive? - - No - Patient  declined No - Patient declined - - -  Pre-existing out of facility DNR order (yellow form or pink MOST form) - - - - - - -    Hospital Utilization Over the Past 12 Months: # of hospitalizations or ER visits: 3 # of surgeries: 1  Review of Systems    Patient reports that his overall health is worse compared to last year.  History obtained from chart review and the patient and his daughter, Steven Patton.   Patient Reported Readings (BP, Pulse, CBG, Weight, etc) none  Pain Assessment Pain : No/denies pain Pain Score: 0-No pain     Current Medications & Allergies (verified) Allergies as of 11/25/2020   No Known Allergies      Medication List        Accurate as of November 25, 2020  1:13 PM. If you have any questions, ask your nurse or doctor.          allopurinol 300 MG tablet Commonly known as: ZYLOPRIM TAKE 1 TABLET TWICE DAILY   AMBULATORY NON FORMULARY MEDICATION Knee-high, firm compression, custom made, graduated compression stockings. Apply to lower extremities.   AMBULATORY NON FORMULARY MEDICATION Light weight wheel chair.   atorvastatin 40 MG tablet Commonly known as: LIPITOR TAKE 1 TABLET AT BEDTIME   blood glucose meter kit and supplies Kit Dispense based on patient and insurance preference. Use up to four times daily as directed. (FOR ICD-9 250.00, 250.01).   diphenhydrAMINE 25 MG tablet Commonly known as: BENADRYL Take 25 mg by mouth at bedtime.   gabapentin 600 MG tablet Commonly known as: NEURONTIN TAKE 1 TABLET THREE TIMES DAILY   linezolid 600 MG tablet Commonly  known as: ZYVOX Take 1 tablet (600 mg total) by mouth 2 (two) times daily.   meloxicam 15 MG tablet Commonly known as: MOBIC TAKE 1 TABLET EVERY DAY   metFORMIN 500 MG tablet Commonly known as: GLUCOPHAGE TAKE 1 TABLET EVERY DAY WITH BREAKFAST   spironolactone 25 MG tablet Commonly known as: ALDACTONE Take 1 tablet (25 mg total) by mouth daily.   torsemide 10  MG tablet Commonly known as: DEMADEX Take 1 tablet (10 mg total) by mouth daily.   torsemide 20 MG tablet Commonly known as: DEMADEX TAKE 1 TABLET EVERY DAY        History (reviewed): Past Medical History:  Diagnosis Date   Arthritis    Cataract    Diabetes mellitus without complication ([place])    Hypertension    takes Amlodipine,Lisinopril,and Metoprolol daily   Lower extremity weakness    Bilateral   Macular degeneration, left eye    Sleep apnea    2008.Marland KitchenMarland KitchenWEARS CPAP   Past Surgical History:  Procedure Laterality Date   BACK SURGERY     CARDIAC CATHETERIZATION  06-10-12   COLONOSCOPY     EYE SURGERY     CATARACT RIGHT EYE   KNEE SURGERY     LUMBAR LAMINECTOMY/DECOMPRESSION MICRODISCECTOMY N/A 11/02/2012   Procedure: LUMBAR LAMINECTOMY/DECOMPRESSION MICRODISCECTOMY 2 LEVEL;  Surgeon: Sinclair Ship, MD;  Location: [place];  Service: Orthopedics;  Laterality: N/A;  Lumbar 4-5,lumbar 5-sacrum 1 decompression   LUMBAR LAMINECTOMY/DECOMPRESSION MICRODISCECTOMY N/A 09/08/2017   Procedure: THORACIC 11-12 DECOMPRESSION;  Surgeon: Phylliss Bob, MD;  Location: [facility];  Service: Orthopedics;  Laterality: N/A;   TONSILLECTOMY     TOTAL HIP ARTHROPLASTY Left 01/06/2013   Procedure: LEFT TOTAL HIP ARTHROPLASTY-Posterior approach;  Surgeon: Alta Corning, MD;  Location: [place];  Service: Orthopedics;  Laterality: Left;   TOTAL HIP ARTHROPLASTY Right 04/21/2013   Procedure: TOTAL HIP ARTHROPLASTY ANTERIOR APPROACH;  Surgeon: Alta Corning, MD;  Location: [place];  Service: Orthopedics;  Laterality: Right;   TOTAL KNEE ARTHROPLASTY Left 09/16/2012   Procedure: TOTAL KNEE ARTHROPLASTY;  Surgeon: Alta Corning, MD;  Location: [facility];  Service: Orthopedics;  Laterality: Left;   Family History  Problem Relation Age of Onset   Cancer Daughter    Alzheimer's disease Mother    Lung cancer Father    Social History   Socioeconomic History   Marital status: Widowed    Spouse name: Not on file    Number of children: 5   Years of education: 10   Highest education level: 10th grade  Occupational History    Comment: Retired  Tobacco Use   Smoking status: Former    Packs/day: 1.00    Years: 20.00    Pack years: 20.00    Types: Cigarettes    Quit date: 01/04/1993    Years since quitting: 27.9   Smokeless tobacco: Former    Types: Chew    Quit date: 01/04/1993   Tobacco comments:    quit 29yr ago  Vaping Use   Vaping Use: Never used  Substance and Sexual Activity   Alcohol use: No   Drug use: No   Sexual activity: Not Currently    Birth control/protection: None  Other Topics Concern   Not on file  Social History Narrative   Lives with his daughter. He has five children. He enjoys watching television.    Social Determinants of Health   Financial Resource Strain: Low Risk    Difficulty of Paying Living Expenses:  Not hard at all  Food Insecurity: No Food Insecurity   Worried About Charity fundraiser in the Last Year: Never true   Ran Out of Food in the Last Year: Never true  Transportation Needs: No Transportation Needs   Lack of Transportation (Medical): No   Lack of Transportation (Non-Medical): No  Physical Activity: Inactive   Days of Exercise per Week: 0 days   Minutes of Exercise per Session: 0 min  Stress: No Stress Concern Present   Feeling of Stress : Not at all  Social Connections: Socially Isolated   Frequency of Communication with Friends and Family: Once a week   Frequency of Social Gatherings with Friends and Family: More than three times a week   Attends Religious Services: Never   Marine scientist or Organizations: No   Attends Archivist Meetings: Never   Marital Status: Widowed    Activities of Daily Living In your present state of health, do you have any difficulty performing the following activities: 11/25/2020  Hearing? N  Vision? Y  Comment he sees a retina specialist and has been diagnosed with macular degeneration. He  has been getting injetions in his right eye.  Difficulty concentrating or making decisions? N  Walking or climbing stairs? Y  Comment He usually uses a walker. He doesn't walk much; only pivots.  Dressing or bathing? Y  Comment His daughter helps with the bathing.  Doing errands, shopping? Y  Comment He doesn't drive and his daughter takes him to the appointments.  Preparing Food and eating ? N  Using the Toilet? N  In the past six months, have you accidently leaked urine? N  Do you have problems with loss of bowel control? N  Managing your Medications? Y  Comment His daughter helps with that.  Managing your Finances? N  Housekeeping or managing your Housekeeping? Y  Comment His daughter helps with that.  Some recent data might be hidden    Patient Education/ Literacy How often do you need to have someone help you when you read instructions, pamphlets, or other written materials from your doctor or pharmacy?: 1 - Never What is the last grade level you completed in school?: 10th grade  Exercise Current Exercise Habits: The patient does not participate in regular exercise at present, Exercise limited by: orthopedic condition(s)  Diet Patient reports consuming 2 meals a day and 3 snack(s) a day Patient reports that his primary diet is: Regular Patient reports that she does have regular access to food.   Depression Screen PHQ 2/9 Scores 11/25/2020 11/18/2020 03/19/2020 12/14/2017 04/26/2017 10/26/2016  PHQ - 2 Score 0 0 0 2 3 0  PHQ- 9 Score - - - 9 6 -     Fall Risk Fall Risk  11/25/2020 11/18/2020 03/19/2020 04/06/2019  Falls in the past year? 0 0 1 1  Number falls in past yr: 0 0 0 0  Injury with Fall? 0 0 0 0  Risk for fall due to : Impaired mobility;Impaired balance/gait Impaired mobility Impaired mobility -  Follow up Falls evaluation completed;Education provided;Falls prevention discussed - - -     Objective:  Rahm Minix seemed alert and oriented and he participated  appropriately during our telephone visit.  Blood Pressure Weight BMI  BP Readings from Last 3 Encounters:  11/18/20 113/72  10/18/20 122/76  10/17/20 128/77   Wt Readings from Last 3 Encounters:  10/18/20 267 lb (121.1 kg)  10/17/20 270 lb (122.5 kg)  10/14/20  271 lb 0.6 oz (122.9 kg)   BMI Readings from Last 1 Encounters:  10/18/20 39.43 kg/m    *Unable to obtain current vital signs, weight, and BMI due to telephone visit type  Hearing/Vision  Awesome did not seem to have difficulty with hearing/understanding during the telephone conversation Reports that he has had a formal eye exam by an eye care professional within the past year Reports that he has not had a formal hearing evaluation within the past year *Unable to fully assess hearing and vision during telephone visit type  Cognitive Function: 6CIT Screen 11/25/2020  What Year? 0 points  What month? 0 points  What time? 0 points  Count back from 20 0 points  Months in reverse 0 points  Repeat phrase 0 points  Total Score 0   (Normal:0-7, Significant for Dysfunction: >8)  Normal Cognitive Function Screening: Yes   Immunization & Health Maintenance Record Immunization History  Administered Date(s) Administered   Fluad Quad(high Dose 65+) 10/07/2018, 11/13/2019   Influenza, High Dose Seasonal PF 10/26/2016, 10/01/2017   Influenza, Seasonal, Injecte, Preservative Fre 01/04/2012   Influenza,inj,Quad PF,6+ Mos 11/15/2012, 04/05/2014, 03/04/2015   PFIZER(Purple Top)SARS-COV-2 Vaccination 04/02/2019, 05/03/2019   Pneumococcal Conjugate-13 08/10/2014   Pneumococcal Polysaccharide-23 11/03/2010   Tdap 01/04/2012   Zoster, Live 10/04/2011    Health Maintenance  Topic Date Due   INFLUENZA VACCINE  11/26/2020 (Originally 09/02/2020)   FOOT EXAM  11/26/2020 (Originally 10/24/2019)   HEMOGLOBIN A1C  11/26/2020 (Originally 03/28/2020)   URINE MICROALBUMIN  11/26/2020 (Originally 09/25/2020)   COVID-19 Vaccine (3 - Pfizer risk  series) 12/11/2020 (Originally 05/31/2019)   Zoster Vaccines- Shingrix (1 of 2) 02/25/2021 (Originally 03/08/1954)   OPHTHALMOLOGY EXAM  04/17/2021   TETANUS/TDAP  01/03/2022   Pneumonia Vaccine 58+ Years old  Completed   HPV VACCINES  Aged Out       Assessment  This is a routine wellness examination for [employer].  Health Maintenance: Due or Overdue There are no preventive care reminders to display for this patient.   Steven Patton does not need a referral for Community Assistance: Care Management:   no Social Work:    no Prescription Assistance:  no Nutrition/Diabetes Education:  no   Plan:  Personalized Goals  Goals Addressed               This Visit's Progress     Patient Stated (pt-stated)        11/25/2020 AWV Goal: Exercise for General Health  Patient will verbalize understanding of the benefits of increased physical activity: Exercising regularly is important. It will improve your overall fitness, flexibility, and endurance. Regular exercise also will improve your overall health. It can help you control your weight, reduce stress, and improve your bone density. Over the next year, patient will increase physical activity as tolerated with a goal of at least 150 minutes of moderate physical activity per week.  You can tell that you are exercising at a moderate intensity if your heart starts beating faster and you start breathing faster but can still hold a conversation. Moderate-intensity exercise ideas include: Walking 1 mile (1.6 km) in about 15 minutes Biking Hiking Golfing Dancing Water aerobics Patient will verbalize understanding of everyday activities that increase physical activity by providing examples like the following: Yard work, such as: Sales promotion account executive Gardening Washing windows or floors Patient will be able to explain general safety guidelines for  exercising:  Before you start a new exercise program, talk with your health care provider. Do not exercise so much that you hurt yourself, feel dizzy, or get very short of breath. Wear comfortable clothes and wear shoes with good support. Drink plenty of water while you exercise to prevent dehydration or heat stroke. Work out until your breathing and your heartbeat get faster.        Personalized Health Maintenance & Screening Recommendations  Influenza vaccine Shingles vaccine Foot exam Hemoglobin A1C Urine ACR  Lung Cancer Screening Recommended: no (Low Dose CT Chest recommended if Age 1-80 years, 30 pack-year currently smoking OR have quit w/in past 15 years) Hepatitis C Screening recommended: no HIV Screening recommended: no  Advanced Directives: Written information was not prepared per patient's request.  Referrals & Orders No orders of the defined types were placed in this encounter.   Follow-up Plan Follow-up with Steven Decamp, MD as planned At your next in-office visit on Wednesday, we can do the foot exam, hemoglobin A1c, urine ACR and the flu vaccine, once you discuss it with Dr. Darene Lamer.  Shingles vaccine can be done at your pharmacy. Medicare wellness visit in one year.  Patient will access AVS on my chart.   I have personally reviewed and noted the following in the patient's chart:   Medical and social history Use of alcohol, tobacco or illicit drugs  Current medications and supplements Functional ability and status Nutritional status Physical activity Advanced directives List of other physicians Hospitalizations, surgeries, and ER visits in previous 12 months Vitals Screenings to include cognitive, depression, and falls Referrals and appointments  In addition, I have reviewed and discussed with Steven Patton certain preventive protocols, quality metrics, and best practice recommendations. A written personalized care plan for preventive services as  well as general preventive health recommendations is available and can be mailed to the patient at his request.      Tinnie Gens, RN  11/25/2020

## 2020-11-26 ENCOUNTER — Inpatient Hospital Stay: Admission: RE | Admit: 2020-11-26 | Payer: Medicare FFS | Source: Ambulatory Visit

## 2020-11-27 ENCOUNTER — Other Ambulatory Visit: Payer: Self-pay

## 2020-11-27 ENCOUNTER — Ambulatory Visit: Payer: Medicare FFS | Admitting: Sports Medicine

## 2020-11-27 DIAGNOSIS — E1169 Type 2 diabetes mellitus with other specified complication: Secondary | ICD-10-CM

## 2020-11-27 DIAGNOSIS — Z23 Encounter for immunization: Secondary | ICD-10-CM

## 2020-11-27 DIAGNOSIS — E669 Obesity, unspecified: Secondary | ICD-10-CM | POA: Diagnosis not present

## 2020-11-27 NOTE — Assessment & Plan Note (Signed)
Hemoglobin A1c has trended in a controlled range recently, 6.2%, today he is complaining of nodules and ulcers on the feet, on exam these look like diabetic foot ulcers, they are over sites of bony prominence, his MTP, he will wear loose fitting diabetic shoes, and they will check the feet daily, none of the ulcers are open and I think they will heal on their own.

## 2020-11-27 NOTE — Progress Notes (Signed)
    Procedures performed today:    None.  Independent interpretation of notes and tests performed by another provider:   None.  Brief History, Exam, Impression, and Recommendations:    Diabetes mellitus type 2 in obese (HCC) Hemoglobin A1c has trended in a controlled range recently, 6.2%, today he is complaining of nodules and ulcers on the feet, on exam these look like diabetic foot ulcers, they are over sites of bony prominence, his MTP, he will wear loose fitting diabetic shoes, and they will check the feet daily, none of the ulcers are open and I think they will heal on their own.    ___________________________________________ [name]. [name], M.D., ABFM., CAQSM. Primary Care and Sports Medicine [facility]  Adjunct Instructor of Family Medicine  [employer]

## 2020-11-30 ENCOUNTER — Other Ambulatory Visit: Payer: Self-pay

## 2020-11-30 ENCOUNTER — Ambulatory Visit
Admission: RE | Admit: 2020-11-30 | Discharge: 2020-11-30 | Disposition: A | Discharge status: Home / Self Care | Payer: Medicare FFS | Source: Ambulatory Visit | Attending: General Surgery | Admitting: General Surgery

## 2020-11-30 DIAGNOSIS — Z9049 Acquired absence of other specified parts of digestive tract: Secondary | ICD-10-CM

## 2020-11-30 MED ORDER — GADOBENATE DIMEGLUMINE 529 MG/ML IV SOLN
20.0000 mL | Freq: Once | INTRAVENOUS | Status: AC | PRN
  Administered 2020-11-30: 20 mL via INTRAVENOUS

## 2020-12-02 ENCOUNTER — Other Ambulatory Visit: Payer: Self-pay | Admitting: General Surgery

## 2020-12-02 DIAGNOSIS — M5416 Radiculopathy, lumbar region: Secondary | ICD-10-CM | POA: Insufficient documentation

## 2020-12-09 ENCOUNTER — Encounter: Payer: Self-pay | Admitting: Internal Medicine

## 2020-12-09 ENCOUNTER — Ambulatory Visit: Payer: Medicare FFS | Admitting: Internal Medicine

## 2020-12-09 ENCOUNTER — Other Ambulatory Visit: Payer: Self-pay

## 2020-12-09 VITALS — BP 119/75 | HR 88 | Temp 98.0°F

## 2020-12-09 DIAGNOSIS — T8143XA Infection following a procedure, organ and space surgical site, initial encounter: Secondary | ICD-10-CM

## 2020-12-09 DIAGNOSIS — Z5181 Encounter for therapeutic drug level monitoring: Secondary | ICD-10-CM | POA: Diagnosis not present

## 2020-12-09 DIAGNOSIS — K810 Acute cholecystitis: Secondary | ICD-10-CM

## 2020-12-09 DIAGNOSIS — N189 Chronic kidney disease, unspecified: Secondary | ICD-10-CM

## 2020-12-10 ENCOUNTER — Encounter: Payer: Self-pay | Admitting: Internal Medicine

## 2020-12-10 DIAGNOSIS — Z5181 Encounter for therapeutic drug level monitoring: Secondary | ICD-10-CM | POA: Insufficient documentation

## 2020-12-10 LAB — CBC WITH DIFFERENTIAL/PLATELET
Absolute Monocytes: 1300 cells/uL — ABNORMAL HIGH (ref 200–950)
Basophils Absolute: 73 cells/uL (ref 0–200)
Basophils Relative: 0.7 %
Eosinophils Absolute: 260 cells/uL (ref 15–500)
Eosinophils Relative: 2.5 %
HCT: 38.4 % — ABNORMAL LOW (ref 38.5–50.0)
Hemoglobin: 12.7 g/dL — ABNORMAL LOW (ref 13.2–17.1)
Lymphs Abs: 2881 cells/uL (ref 850–3900)
MCH: 30.6 pg (ref 27.0–33.0)
MCHC: 33.1 g/dL (ref 32.0–36.0)
MCV: 92.5 fL (ref 80.0–100.0)
MPV: 12.6 fL — ABNORMAL HIGH (ref 7.5–12.5)
Monocytes Relative: 12.5 %
Neutro Abs: 5886 cells/uL (ref 1500–7800)
Neutrophils Relative %: 56.6 %
Platelets: 64 10*3/uL — ABNORMAL LOW (ref 140–400)
RBC: 4.15 10*6/uL — ABNORMAL LOW (ref 4.20–5.80)
RDW: 15.8 % — ABNORMAL HIGH (ref 11.0–15.0)
Total Lymphocyte: 27.7 %
WBC: 10.4 10*3/uL (ref 3.8–10.8)

## 2020-12-10 LAB — COMPLETE METABOLIC PANEL WITH GFR
AG Ratio: 1.6 (calc) (ref 1.0–2.5)
ALT: 32 U/L (ref 9–46)
AST: 25 U/L (ref 10–35)
Albumin: 4.1 g/dL (ref 3.6–5.1)
Alkaline phosphatase (APISO): 56 U/L (ref 35–144)
BUN/Creatinine Ratio: 12 (calc) (ref 6–22)
BUN: 16 mg/dL (ref 7–25)
CO2: 26 mmol/L (ref 20–32)
Calcium: 9.3 mg/dL (ref 8.6–10.3)
Chloride: 104 mmol/L (ref 98–110)
Creat: 1.34 mg/dL — ABNORMAL HIGH (ref 0.70–1.22)
Globulin: 2.5 g/dL (calc) (ref 1.9–3.7)
Glucose, Bld: 129 mg/dL — ABNORMAL HIGH (ref 65–99)
Potassium: 3.8 mmol/L (ref 3.5–5.3)
Sodium: 141 mmol/L (ref 135–146)
Total Bilirubin: 0.4 mg/dL (ref 0.2–1.2)
Total Protein: 6.6 g/dL (ref 6.1–8.1)
eGFR: 53 mL/min/{1.73_m2} — ABNORMAL LOW (ref >=60)

## 2020-12-10 NOTE — Assessment & Plan Note (Signed)
Creat checked and at his baseline level.

## 2020-12-10 NOTE — Assessment & Plan Note (Signed)
MRI confirms ongoing abscess and plan for surgical intervention noted for source control.  At this time, he is doing well and no absolute indication to continue with antibiotic treatment so will hold off pending the surgery.  We can readdress antibiotics after surgery, depending on the findings.

## 2020-12-10 NOTE — Assessment & Plan Note (Signed)
Labs checked at the time of the visit and reviewed.  He has significant thrombocytopenia after being on linezolid so will not have that as an option for treatment.  Will need to recheck this prior to surgery, which will be part of his preop labs.  I anticipate the platelets will rise to normal off of linezolid.

## 2020-12-10 NOTE — Progress Notes (Signed)
   Subjective:    Patient ID: Steven Patton, male    DOB: [birth year], 85 y.o.   MRN: [id number]  HPI Here for follow up for splenic abscess. 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  He has requried repeat placement of drains and had not resolved.  Recent culture growth included Corynebacteria and I started him on linezolid.  He has had an MRI which confirmed ongoing abscess and plan for surgery on 01/02/21 by Dr. Donell Beers.  No fever, no chills, no rash, no diarrhea.    Review of Systems  Constitutional:  Negative for chills, fatigue and fever.  Gastrointestinal:  Negative for diarrhea and nausea.  Skin:  Negative for rash.      Objective:   Physical Exam Eyes:     General: No scleral icterus. Pulmonary:     Effort: Pulmonary effort is normal.  Skin:    Findings: No rash.  Neurological:     Mental Status: He is alert.  Psychiatric:        Mood and Affect: Mood normal.    SH: no tobacco      Assessment & Plan:

## 2020-12-30 NOTE — Progress Notes (Signed)
Surgical Instructions   Your procedure is scheduled on Thursday 01/02/2021.  Report to [facility] Main Entrance "A" at 05:30 A.M., then check in with the Admitting office.  Call [phone] if you have problems or questions between now and the morning of surgery:   Remember: Do not eat after midnight the night before your surgery  You may drink clear liquids until 04:30 the morning of your surgery.   Clear liquids allowed are: Water, Non-Citrus Juices (without pulp), Carbonated Beverages, Clear Tea, Black Coffee Only (NO MILK, CREAM, or POWDERED CREAMER of any kind), and Gatorade   Take these medicines the morning of surgery with A SIP OF WATER:  Allopurinol (Zyloprim) Gabapentin (Neurontin)    If needed you may take these medications the morning of surgery: Nitrogylcerin (Nitrostat)   As of today, STOP taking any Meloxicam (Mobic), Aspirin (unless otherwise instructed by your surgeon) or Aspirin-containing products; NSAIDS - Aleve, Naproxen, Ibuprofen, Motrin, Advil, Goody's, BC's, all herbal medications, fish oil, and all vitamins.  WHAT DO I DO ABOUT MY DIABETES MEDICATION?  Do not take oral diabetes medicines (pills) the morning of surgery - DO NOT take Metformin (Glucophage) the morning of surgery   HOW TO MANAGE YOUR DIABETES BEFORE AND AFTER SURGERY  Why is it important to control my blood sugar before and after surgery? Improving blood sugar levels before and after surgery helps healing and can limit problems. A way of improving blood sugar control is eating a healthy diet by:  Eating less sugar and carbohydrates  Increasing activity/exercise  Talking with your doctor about reaching your blood sugar goals High blood sugars (greater than 180 mg/dL) can raise your risk of infections and slow your recovery, so you will need to focus on controlling your diabetes during the weeks before surgery. Make sure that the doctor who takes care of your diabetes knows about your  planned surgery including the date and location.  How do I manage my blood sugar before surgery? Check your blood sugar at least 4 times a day, starting 2 days before surgery, to make sure that the level is not too high or low.  Check your blood sugar the morning of your surgery when you wake up and every 2 hours until you get to the Short Stay unit.  If your blood sugar is less than 70 mg/dL, you will need to treat for low blood sugar: Do not take insulin. Treat a low blood sugar (less than 70 mg/dL) with  cup of clear juice (cranberry or apple), 4 glucose tablets, OR glucose gel. Recheck blood sugar in 15 minutes after treatment (to make sure it is greater than 70 mg/dL). If your blood sugar is not greater than 70 mg/dL on recheck, call [phone] for further instructions. Report your blood sugar to the short stay nurse when you get to Short Stay.  If you are admitted to the hospital after surgery: Your blood sugar will be checked by the staff and you will probably be given insulin after surgery (instead of oral diabetes medicines) to make sure you have good blood sugar levels. The goal for blood sugar control after surgery is 80-180 mg/dL.   3 days leading up to your surgery or after your pre-procedure COVID test You are not required to quarantine however you are required to wear a well-fitting mask when you are out and around people not in your household.  If your mask becomes wet or soiled, replace with a new one.  Wash your hands often  with soap and water for 20 seconds or clean your hands with an alcohol-based hand sanitizer that contains at least 60% alcohol.  Do not share personal items.  Notify your provider: if you are in close contact with someone who has COVID  or if you develop a fever of 100.4 or greater, sneezing, cough, sore throat, shortness of breath or body aches.          Do not wear jewelry or makeup  Do not wear lotions, powders, perfumes/colognes, or  deodorant.  Do not shave 48 hours prior to surgery.  Men may shave face and neck.  Do not wear nail polish, gel polish, artificial nails, or any other type of covering on natural nails including fingernails and toenails. If patients have artificial nails, gel coating, etc. that need to be removed by a nail salon please have this removed prior to surgery or surgery may need to be canceled/delayed if the surgeon/ anesthesia feels like the patient is unable to be adequately monitored.  Do not bring valuables to the hospital - [facility] is not responsible for any belongings or valuables.  Do NOT Smoke (Tobacco/Vaping) or drink Alcohol 24 hours prior to your procedure  If you use a CPAP at night, please bring your mask for your overnight stay.   Contacts, glasses, hearing aids, dentures or partials may not be worn into surgery, please bring cases for these belongings   For patients admitted to the hospital, discharge time will be determined by your treatment team.   Patients discharged the day of surgery will not be allowed to drive home, and someone needs to stay with them for 24 hours.  NO VISITORS WILL BE ALLOWED IN PRE-OP WHERE PATIENTS ARE PREPPED FOR SURGERY.  ONLY 1 SUPPORT PERSON MAY BE PRESENT IN THE WAITING ROOM WHILE YOU ARE IN SURGERY.  IF YOU ARE TO BE ADMITTED, ONCE YOU ARE IN YOUR ROOM YOU WILL BE ALLOWED TWO (2) VISITORS. 1 (ONE) VISITOR MAY STAY OVERNIGHT BUT MUST ARRIVE TO THE ROOM BY 8pm.  Minor children may have two parents present. Special consideration for safety and communication needs will be reviewed on a case by case basis.  Special instructions:    Oral Hygiene is also important to reduce your risk of infection.  Remember - BRUSH YOUR TEETH THE MORNING OF SURGERY WITH YOUR REGULAR TOOTHPASTE   [place]- Preparing For Surgery  Before surgery, you can play an important role. Because skin is not sterile, your skin needs to be as free of germs as possible. You can  reduce the number of germs on your skin by washing with CHG (chlorahexidine gluconate) Soap before surgery.  CHG is an antiseptic cleaner which kills germs and bonds with the skin to continue killing germs even after washing.     Please do not use if you have an allergy to CHG or antibacterial soaps. If your skin becomes reddened/irritated stop using the CHG.  Do not shave (including legs and underarms) for at least 48 hours prior to first CHG shower. It is OK to shave your face.  Please follow these instructions carefully.     Shower the NIGHT BEFORE SURGERY and the MORNING OF SURGERY with CHG Soap.   If you chose to wash your hair, wash your hair first as usual with your normal shampoo. After you shampoo, rinse your hair and body thoroughly to remove the shampoo.    Then [employer] and genitals (private parts) with your normal soap and  rinse thoroughly to remove soap.  Next use the CHG Soap as you would any other liquid soap. You can apply CHG directly to the skin and wash gently with a clean washcloth.   Apply the CHG Soap to your body ONLY FROM THE NECK DOWN.  Do not use on open wounds or open sores. Avoid contact with your eyes, ears, mouth and genitals (private parts). Wash Face and genitals (private parts)  with your normal soap.   Wash thoroughly, paying special attention to the area where your surgery will be performed.  Thoroughly rinse your body with warm water from the neck down.  DO NOT shower/wash with your normal soap after using and rinsing off the CHG Soap.  Pat yourself dry with a CLEAN TOWEL.  Wear CLEAN PAJAMAS to bed the night before surgery  Place CLEAN SHEETS on your bed the night before your surgery  DO NOT SLEEP WITH PETS.   Day of Surgery:  Take a shower with CHG soap. Wear Clean/Comfortable clothing the morning of surgery Do not apply any deodorants/lotions.   Remember to brush your teeth WITH YOUR REGULAR TOOTHPASTE.   Please read over the fact  sheets that you were given.

## 2020-12-31 ENCOUNTER — Encounter (HOSPITAL_COMMUNITY)
Admission: RE | Admit: 2020-12-31 | Discharge: 2020-12-31 | Disposition: A | Discharge status: Home / Self Care | Payer: Medicare FFS | Source: Ambulatory Visit | Attending: General Surgery | Admitting: General Surgery

## 2020-12-31 ENCOUNTER — Other Ambulatory Visit: Payer: Self-pay | Admitting: General Surgery

## 2020-12-31 ENCOUNTER — Encounter (HOSPITAL_COMMUNITY): Payer: Self-pay

## 2020-12-31 ENCOUNTER — Other Ambulatory Visit: Payer: Self-pay

## 2020-12-31 VITALS — BP 143/76 | HR 82 | Temp 97.7°F | Resp 19 | Ht 69.0 in | Wt 270.0 lb

## 2020-12-31 DIAGNOSIS — Z01812 Encounter for preprocedural laboratory examination: Secondary | ICD-10-CM | POA: Diagnosis present

## 2020-12-31 DIAGNOSIS — Z01818 Encounter for other preprocedural examination: Secondary | ICD-10-CM

## 2020-12-31 DIAGNOSIS — E119 Type 2 diabetes mellitus without complications: Secondary | ICD-10-CM | POA: Insufficient documentation

## 2020-12-31 DIAGNOSIS — E1169 Type 2 diabetes mellitus with other specified complication: Secondary | ICD-10-CM

## 2020-12-31 DIAGNOSIS — Z20822 Contact with and (suspected) exposure to covid-19: Secondary | ICD-10-CM | POA: Diagnosis not present

## 2020-12-31 DIAGNOSIS — D649 Anemia, unspecified: Secondary | ICD-10-CM | POA: Insufficient documentation

## 2020-12-31 DIAGNOSIS — N289 Disorder of kidney and ureter, unspecified: Secondary | ICD-10-CM

## 2020-12-31 HISTORY — DX: Chronic kidney disease, unspecified: N18.9

## 2020-12-31 LAB — BASIC METABOLIC PANEL
Anion gap: 8 (ref 5–15)
BUN: 19 mg/dL (ref 8–23)
CO2: 27 mmol/L (ref 22–32)
Calcium: 9 mg/dL (ref 8.9–10.3)
Chloride: 102 mmol/L (ref 98–111)
Creatinine, Ser: 1.49 mg/dL — ABNORMAL HIGH (ref 0.61–1.24)
GFR, Estimated: 46 mL/min — ABNORMAL LOW (ref >60)
Glucose, Bld: 120 mg/dL — ABNORMAL HIGH (ref 70–99)
Potassium: 4 mmol/L (ref 3.5–5.1)
Sodium: 137 mmol/L (ref 135–145)

## 2020-12-31 LAB — CBC
HCT: 41.2 % (ref 39.0–52.0)
Hemoglobin: 12.5 g/dL — ABNORMAL LOW (ref 13.0–17.0)
MCH: 30.3 pg (ref 26.0–34.0)
MCHC: 30.3 g/dL (ref 30.0–36.0)
MCV: 99.8 fL (ref 80.0–100.0)
Platelets: 132 10*3/uL — ABNORMAL LOW (ref 150–400)
RBC: 4.13 MIL/uL — ABNORMAL LOW (ref 4.22–5.81)
RDW: 18.1 % — ABNORMAL HIGH (ref 11.5–15.5)
WBC: 8.6 10*3/uL (ref 4.0–10.5)
nRBC: 0 % (ref 0.0–0.2)

## 2020-12-31 LAB — GLUCOSE, CAPILLARY: Glucose-Capillary: 139 mg/dL — ABNORMAL HIGH (ref 70–99)

## 2020-12-31 NOTE — H&P (Signed)
PROVIDER: Matthias Hughs, MD  MRN: [id number] DOB: [birth year] DATE OF ENCOUNTER: 12/12/2020 Subjective   Chief Complaint: Abscess   History of Present Illness: Steven Patton is a 85 y.o. male who is seen today for follow up  He had a lap chole at [facility] 03/2020 for gangrenous cholecystitis. Since then he has had multiple abscesses and drains. (Four total) Most recently his drain was pulled mid august 2022 after it was scant per day, cavity was gone. Two weeks after that he started having right flank pain again and pus started draining from his old drain site around 2 weeks ago. Repeat CT showed a 6 cm abscess and a new perc drain was placed. The purulent material was sent for cx and showed corynebacterium. Sensitivities were not performed. This drain, as all of them have, went down to around 5 ml/day. There is some dark "sandy" type material in the drain. He has had a HIDA ruling out a bile leak. Not unexpectedly, the patient became very frustrated and sought another opinion.   Interval history: The patient has had an MRI and saw ID. He was placed on linezolid. MRI didn't definitely show stones, but showed some material that might represent stones.     Review of Systems: A complete review of systems was obtained from the patient. I have reviewed this information and discussed as appropriate with the patient. See HPI as well for other ROS.  Review of Systems  All other systems reviewed and are negative.   Medical History: Past Medical History:  Diagnosis Date   Chronic kidney disease   Diabetes mellitus without complication (CMS-HCC)   Sleep apnea   Patient Active Problem List  Diagnosis   Intraabdominal fluid collection   Intra-abdominal abscess (CMS-HCC)   Past Surgical History:  Procedure Laterality Date   gallbladder removed   hip replacement surgery   knee replacement surgery    No Known Allergies  Current Outpatient Medications on File Prior to Visit   Medication Sig Dispense Refill   acetaminophen (TYLENOL) 500 MG tablet Take by mouth   allopurinoL (ZYLOPRIM) 300 MG tablet   atorvastatin (LIPITOR) 40 MG tablet   gabapentin (NEURONTIN) 600 MG tablet Take 1 tablet by mouth 3 (three) times daily   meloxicam (MOBIC) 15 MG tablet Take 1 tablet by mouth once daily   metFORMIN (GLUCOPHAGE) 500 MG tablet   spironolactone (ALDACTONE) 25 MG tablet   TORsemide (DEMADEX) 20 MG tablet   No current facility-administered medications on file prior to visit.   History reviewed. No pertinent family history.   Social History   Tobacco Use  Smoking Status Never  Smokeless Tobacco Never    Social History   Socioeconomic History   Marital status: Married  Tobacco Use   Smoking status: Never   Smokeless tobacco: Never  Vaping Use   Vaping Use: Never used  Substance and Sexual Activity   Alcohol use: Never   Drug use: Never   Objective:   There were no vitals filed for this visit.  There is no height or weight on file to calculate BMI.  Head: Normocephalic and atraumatic.  Eyes: Conjunctivae are normal. Pupils are equal, round, and reactive to light. No scleral icterus.  Neck: Normal range of motion. Neck supple. No tracheal deviation present. No thyromegaly present.  Resp: No respiratory distress, normal effort. Abd: Abdomen is soft, non distended and non tender. No masses are palpable. There is no rebound and no guarding. Drain to gravity bag has some dark  sandy material.  Neurological: Alert and oriented to person, place, and time. Coordination normal.  Skin: Skin is warm and dry. No rash noted. No diaphoretic. No erythema. No pallor.  Psychiatric: Normal mood and affect. Normal behavior. Judgment and thought content normal.   Labs, Imaging and Diagnostic Testing:  Mr abdomen 11/30/20 IMPRESSION: 1. Persistent subdiaphragmatic complex fluid collection which has slight mass effect on the dome of the liver. This is likely  a persistent abscess. 2. The subhepatic abscess appears adequately drained. No residual fluid/abscess. 3. 3 mm nonobstructing common bile duct stone  Assessment and Plan:  Diagnoses and all orders for this visit:  Intra-abdominal abscess (CMS-HCC) Assessment & Plan: This is most likely due to retained stones as a nidus for infection.  Discussed trip to the operating room to try to clean out this area.  I discussed the inherent difficulty with this as he likely has significant adhesions present. I would try a laparoscopic approach to get the infrahepatic collection freed up. However, this could be a bloody endeavor and may require open approach. I reviewed that this could be quite painful and could require prolonged hospital stay. I also discussed that this might not fix the problem or he could develop bile leak and abscess from this surgery. However, he is getting sick with these recurrent abscesses which is also a danger to his life in addition to be extremely painful and inconvenient.  Will plan surgery. I would like his drain to stay in until surgery in order to have a good guide to the site of the abscess.   Other orders - sodium chloride 0.9% flush injection; 10 mLs by Intracatheter route as directed for [facility] for up to 60 days  No follow-ups on file.  Matthias Hughs, MD

## 2020-12-31 NOTE — Progress Notes (Signed)
PCP - Dr. Darlin Priestly Cardiologist - denies  PPM/ICD - n/a  Chest x-ray - 05/31/20 EKG - 03/18/20 Stress Test - 10+ years ago ECHO - 03/22/17 Cardiac Cath - 2014  Sleep Study - OSA+ CPAP - uses nightly  Fasting Blood Sugar - 120-140 Checks Blood Sugar 1-2 times a week CBG at PAT 139 Will collect A1C  Blood Thinner Instructions: n/a Aspirin Instructions: n/a  ERAS Protcol -Clear liquids until 0430 DOS PRE-SURGERY Ensure or G2- none ordered.  COVID TEST- 12/31/20; done in PAT   Anesthesia review: Yes, per order  Patient denies shortness of breath, fever, cough and chest pain at PAT appointment   All instructions explained to the patient, with a verbal understanding of the material. Patient agrees to go over the instructions while at home for a better understanding. Patient also instructed to self quarantine after being tested for COVID-19. The opportunity to ask questions was provided.

## 2021-01-01 LAB — HEMOGLOBIN A1C
Hgb A1c MFr Bld: 5.7 % — ABNORMAL HIGH (ref 4.8–5.6)
Mean Plasma Glucose: 117 mg/dL

## 2021-01-01 LAB — SARS CORONAVIRUS 2 (TAT 6-24 HRS): SARS Coronavirus 2: NEGATIVE

## 2021-01-01 NOTE — Anesthesia Preprocedure Evaluation (Addendum)
Anesthesia Evaluation  Patient identified by MRN, date of birth, ID band Patient awake    Reviewed: Allergy & Precautions, NPO status , Patient's Chart, lab work & pertinent test results  Airway Mallampati: II       Dental   Pulmonary sleep apnea , former smoker,    breath sounds clear to auscultation       Cardiovascular hypertension,  Rhythm:Regular Rate:Normal     Neuro/Psych  Neuromuscular disease    GI/Hepatic Neg liver ROS,   Endo/Other  diabetes  Renal/GU Renal disease     Musculoskeletal  (+) Arthritis ,   Abdominal   Peds  Hematology   Anesthesia Other Findings   Reproductive/Obstetrics                           Anesthesia Physical Anesthesia Plan  ASA: 3  Anesthesia Plan: General   Post-op Pain Management:    Induction: Intravenous  PONV Risk Score and Plan: 2 and Ondansetron, Dexamethasone and Midazolam  Airway Management Planned: LMA  Additional Equipment:   Intra-op Plan:   Post-operative Plan: Possible Post-op intubation/ventilation  Informed Consent: I have reviewed the patients History and Physical, chart, labs and discussed the procedure including the risks, benefits and alternatives for the proposed anesthesia with the patient or authorized representative who has indicated his/her understanding and acceptance.       Plan Discussed with: CRNA  Anesthesia Plan Comments: (PAT note by Antionette Poles, PA-C: Patient admitted at [facility] February 2022 for acute cholecystitis and underwent laparoscopic cholecystectomy without complication.  He has had recurrent abscess and required repeat placement of drains without resolution.  He was seen by infectious disease 12/09/2020.  It was noted that recent culture growth included chronic bacteria and he was started on linezolid.  He also had an MRI which confirmed ongoing abscess and surgical intervention was planned.  History  of cardiology evaluation May 2014 for report of intermittent chest pain.  Cardiac cath 06/10/2012 showed normal coronaries, EF 60%, and no further cardiac work-up was recommended.  Patient is on torsemide for history of HFpEF.  This is managed by his PCP.  Preop labs reviewed, creatinine elevated at 1.49 c/w history of renal sufficiency, platelets mildly low at 132k, however this is improved from previous labs 12/09/2020 showing platelets 64k.  Mild anemia with hemoglobin 12.5.  Labs otherwise unremarkable.  DM2 well-controlled with A1c 5.7.  EKG 03/16/20: Sinus rhythm with 1st degree A-V block with Premature supraventricular complexes. Rate 65. Left axis deviation. Low voltage QRS. Cannot rule out Anterior infarct , age undetermined  TTE 03/22/2017: - Left ventricle: The cavity size was normal. There was mild  concentric hypertrophy. Systolic function was normal. The  estimated ejection fraction was in the range of 60% to 65%. Wall  motion was normal; there were no regional wall motion  abnormalities. Features are consistent with a pseudonormal left  ventricular filling pattern, with concomitant abnormal relaxation  and increased filling pressure (grade 2 diastolic dysfunction).  - Mitral valve: Calcified annulus.  - Left atrium: The atrium was severely dilated.  - Right atrium: The atrium was mildly dilated.  )      Anesthesia Quick Evaluation

## 2021-01-01 NOTE — Progress Notes (Signed)
Anesthesia Chart Review:  Patient admitted at [facility] February 2022 for acute cholecystitis and underwent laparoscopic cholecystectomy without complication.  He has had recurrent abscess and required repeat placement of drains without resolution.  He was seen by infectious disease 12/09/2020.  It was noted that recent culture growth included chronic bacteria and he was started on linezolid.  He also had an MRI which confirmed ongoing abscess and surgical intervention was planned.  History of cardiology evaluation May 2014 for report of intermittent chest pain.  Cardiac cath 06/10/2012 showed normal coronaries, EF 60%, and no further cardiac work-up was recommended.  Patient is on torsemide for history of HFpEF.  This is managed by his PCP.  Preop labs reviewed, creatinine elevated at 1.49 c/w history of renal sufficiency, platelets mildly low at 132k, however this is improved from previous labs 12/09/2020 showing platelets 64k.  Mild anemia with hemoglobin 12.5.  Labs otherwise unremarkable.  DM2 well-controlled with A1c 5.7.  EKG 03/16/20: Sinus rhythm with 1st degree A-V block with Premature supraventricular complexes. Rate 65. Left axis deviation. Low voltage QRS. Cannot rule out Anterior infarct , age undetermined  TTE 03/22/2017: - Left ventricle: The cavity size was normal. There was mild    concentric hypertrophy. Systolic function was normal. The    estimated ejection fraction was in the range of 60% to 65%. Wall    motion was normal; there were no regional wall motion    abnormalities. Features are consistent with a pseudonormal left    ventricular filling pattern, with concomitant abnormal relaxation    and increased filling pressure (grade 2 diastolic dysfunction).  - Mitral valve: Calcified annulus.  - Left atrium: The atrium was severely dilated.  - Right atrium: The atrium was mildly dilated.      Zannie Cove [facility] Short Stay Center/Anesthesiology Phone [phone] 01/01/2021 9:37 AM

## 2021-01-02 ENCOUNTER — Observation Stay (HOSPITAL_COMMUNITY)
Admission: RE | Admit: 2021-01-02 | Discharge: 2021-01-02 | Disposition: A | Discharge status: Home / Self Care | Payer: Medicare FFS | Attending: General Surgery | Admitting: General Surgery

## 2021-01-02 ENCOUNTER — Encounter (HOSPITAL_COMMUNITY): Payer: Self-pay | Admitting: General Surgery

## 2021-01-02 ENCOUNTER — Ambulatory Visit (HOSPITAL_COMMUNITY): Payer: Medicare FFS | Admitting: Certified Registered Nurse Anesthetist

## 2021-01-02 ENCOUNTER — Other Ambulatory Visit: Payer: Self-pay

## 2021-01-02 ENCOUNTER — Ambulatory Visit (HOSPITAL_COMMUNITY): Payer: Medicare FFS | Admitting: Physician Assistant

## 2021-01-02 ENCOUNTER — Encounter (HOSPITAL_COMMUNITY)
Admission: RE | Disposition: A | Discharge status: Home / Self Care | Payer: Self-pay | Source: Home / Self Care | Attending: General Surgery

## 2021-01-02 DIAGNOSIS — K651 Peritoneal abscess: Secondary | ICD-10-CM | POA: Diagnosis present

## 2021-01-02 DIAGNOSIS — K6811 Postprocedural retroperitoneal abscess: Principal | ICD-10-CM | POA: Insufficient documentation

## 2021-01-02 DIAGNOSIS — N189 Chronic kidney disease, unspecified: Secondary | ICD-10-CM | POA: Insufficient documentation

## 2021-01-02 DIAGNOSIS — E1122 Type 2 diabetes mellitus with diabetic chronic kidney disease: Secondary | ICD-10-CM | POA: Diagnosis not present

## 2021-01-02 DIAGNOSIS — Z96649 Presence of unspecified artificial hip joint: Secondary | ICD-10-CM | POA: Insufficient documentation

## 2021-01-02 DIAGNOSIS — Z96659 Presence of unspecified artificial knee joint: Secondary | ICD-10-CM | POA: Diagnosis not present

## 2021-01-02 DIAGNOSIS — Z79899 Other long term (current) drug therapy: Secondary | ICD-10-CM | POA: Diagnosis not present

## 2021-01-02 DIAGNOSIS — Z7984 Long term (current) use of oral hypoglycemic drugs: Secondary | ICD-10-CM | POA: Insufficient documentation

## 2021-01-02 DIAGNOSIS — Z9049 Acquired absence of other specified parts of digestive tract: Secondary | ICD-10-CM

## 2021-01-02 DIAGNOSIS — T8143XA Infection following a procedure, organ and space surgical site, initial encounter: Secondary | ICD-10-CM | POA: Diagnosis present

## 2021-01-02 DIAGNOSIS — L02211 Cutaneous abscess of abdominal wall: Secondary | ICD-10-CM | POA: Diagnosis present

## 2021-01-02 HISTORY — PX: LAPAROSCOPY: SHX197

## 2021-01-02 LAB — GLUCOSE, CAPILLARY
Glucose-Capillary: 108 mg/dL — ABNORMAL HIGH (ref 70–99)
Glucose-Capillary: 98 mg/dL (ref 70–99)

## 2021-01-02 SURGERY — LAPAROSCOPY, DIAGNOSTIC
Anesthesia: General

## 2021-01-02 MED ORDER — ROCURONIUM BROMIDE 10 MG/ML (PF) SYRINGE
PREFILLED_SYRINGE | INTRAVENOUS | Status: AC
  Filled 2021-01-02: qty 10

## 2021-01-02 MED ORDER — PROPOFOL 10 MG/ML IV BOLUS
INTRAVENOUS | Status: AC
  Filled 2021-01-02: qty 20

## 2021-01-02 MED ORDER — DEXAMETHASONE SODIUM PHOSPHATE 10 MG/ML IJ SOLN
INTRAMUSCULAR | Status: DC | PRN
  Administered 2021-01-02: 5 mg via INTRAVENOUS

## 2021-01-02 MED ORDER — FENTANYL CITRATE (PF) 100 MCG/2ML IJ SOLN: 25.0000 ug | INTRAMUSCULAR | Status: DC | PRN

## 2021-01-02 MED ORDER — ONDANSETRON HCL 4 MG/2ML IJ SOLN
INTRAMUSCULAR | Status: AC
  Filled 2021-01-02: qty 2

## 2021-01-02 MED ORDER — ACETAMINOPHEN 500 MG PO TABS
1000.0000 mg | ORAL_TABLET | ORAL | Status: AC
  Administered 2021-01-02: 1000 mg via ORAL
  Filled 2021-01-02: qty 2

## 2021-01-02 MED ORDER — SUCCINYLCHOLINE CHLORIDE 200 MG/10ML IV SOSY
PREFILLED_SYRINGE | INTRAVENOUS | Status: DC | PRN
  Administered 2021-01-02: 120 mg via INTRAVENOUS

## 2021-01-02 MED ORDER — LIDOCAINE HCL 1 % IJ SOLN
INTRAMUSCULAR | Status: DC | PRN
  Administered 2021-01-02: 17 mL via SUBCUTANEOUS

## 2021-01-02 MED ORDER — BUPIVACAINE-EPINEPHRINE (PF) 0.25% -1:200000 IJ SOLN
INTRAMUSCULAR | Status: AC
  Filled 2021-01-02: qty 30

## 2021-01-02 MED ORDER — LACTATED RINGERS IV SOLN: INTRAVENOUS | Status: DC | PRN

## 2021-01-02 MED ORDER — CHLORHEXIDINE GLUCONATE CLOTH 2 % EX PADS: 6.0000 | MEDICATED_PAD | Freq: Once | CUTANEOUS | Status: DC

## 2021-01-02 MED ORDER — SUCCINYLCHOLINE CHLORIDE 200 MG/10ML IV SOSY
PREFILLED_SYRINGE | INTRAVENOUS | Status: AC
  Filled 2021-01-02: qty 10

## 2021-01-02 MED ORDER — CHLORHEXIDINE GLUCONATE 0.12 % MT SOLN
15.0000 mL | Freq: Once | OROMUCOSAL | Status: AC
  Administered 2021-01-02: 15 mL via OROMUCOSAL
  Filled 2021-01-02: qty 15

## 2021-01-02 MED ORDER — OXYCODONE HCL 5 MG PO TABS: 2.5000 mg | ORAL_TABLET | ORAL | 0 refills | Status: DC | PRN

## 2021-01-02 MED ORDER — LACTATED RINGERS IV SOLN: INTRAVENOUS | Status: DC

## 2021-01-02 MED ORDER — SODIUM CHLORIDE (PF) 0.9 % IJ SOLN
INTRAMUSCULAR | Status: AC
  Filled 2021-01-02: qty 10

## 2021-01-02 MED ORDER — SUGAMMADEX SODIUM 500 MG/5ML IV SOLN
INTRAVENOUS | Status: AC
  Filled 2021-01-02: qty 5

## 2021-01-02 MED ORDER — CEFAZOLIN SODIUM 1 G IJ SOLR
INTRAMUSCULAR | Status: AC
  Filled 2021-01-02: qty 10

## 2021-01-02 MED ORDER — FENTANYL CITRATE (PF) 250 MCG/5ML IJ SOLN
INTRAMUSCULAR | Status: AC
  Filled 2021-01-02: qty 5

## 2021-01-02 MED ORDER — DEXAMETHASONE SODIUM PHOSPHATE 10 MG/ML IJ SOLN
INTRAMUSCULAR | Status: AC
  Filled 2021-01-02: qty 1

## 2021-01-02 MED ORDER — LIDOCAINE 2% (20 MG/ML) 5 ML SYRINGE
INTRAMUSCULAR | Status: AC
  Filled 2021-01-02: qty 5

## 2021-01-02 MED ORDER — PROPOFOL 10 MG/ML IV BOLUS
INTRAVENOUS | Status: DC | PRN
  Administered 2021-01-02: 150 mg via INTRAVENOUS

## 2021-01-02 MED ORDER — SODIUM CHLORIDE 0.9 % IR SOLN
Status: DC | PRN
  Administered 2021-01-02: 2000 mL

## 2021-01-02 MED ORDER — FENTANYL CITRATE (PF) 250 MCG/5ML IJ SOLN
INTRAMUSCULAR | Status: DC | PRN
  Administered 2021-01-02: 100 ug via INTRAVENOUS
  Administered 2021-01-02: 50 ug via INTRAVENOUS

## 2021-01-02 MED ORDER — PHENYLEPHRINE 40 MCG/ML (10ML) SYRINGE FOR IV PUSH (FOR BLOOD PRESSURE SUPPORT)
PREFILLED_SYRINGE | INTRAVENOUS | Status: DC | PRN
  Administered 2021-01-02 (×3): 80 ug via INTRAVENOUS

## 2021-01-02 MED ORDER — CEFAZOLIN SODIUM-DEXTROSE 2-4 GM/100ML-% IV SOLN
2.0000 g | INTRAVENOUS | Status: AC
  Administered 2021-01-02: 2 g via INTRAVENOUS
  Filled 2021-01-02: qty 100

## 2021-01-02 MED ORDER — LIDOCAINE HCL (PF) 1 % IJ SOLN
INTRAMUSCULAR | Status: AC
  Filled 2021-01-02: qty 30

## 2021-01-02 MED ORDER — LIDOCAINE 2% (20 MG/ML) 5 ML SYRINGE
INTRAMUSCULAR | Status: DC | PRN
  Administered 2021-01-02: 100 mg via INTRAVENOUS

## 2021-01-02 MED ORDER — ONDANSETRON HCL 4 MG/2ML IJ SOLN
INTRAMUSCULAR | Status: DC | PRN
  Administered 2021-01-02: 4 mg via INTRAVENOUS

## 2021-01-02 MED ORDER — CEFAZOLIN SODIUM-DEXTROSE 1-4 GM/50ML-% IV SOLN
INTRAVENOUS | Status: DC | PRN
  Administered 2021-01-02: 1 g via INTRAVENOUS

## 2021-01-02 MED ORDER — SUGAMMADEX SODIUM 500 MG/5ML IV SOLN
INTRAVENOUS | Status: DC | PRN
  Administered 2021-01-02: 300 mg via INTRAVENOUS

## 2021-01-02 MED ORDER — PHENYLEPHRINE HCL-NACL 20-0.9 MG/250ML-% IV SOLN
INTRAVENOUS | Status: DC | PRN
  Administered 2021-01-02: 30 ug/min via INTRAVENOUS

## 2021-01-02 MED ORDER — ORAL CARE MOUTH RINSE: 15.0000 mL | Freq: Once | OROMUCOSAL | Status: AC

## 2021-01-02 MED ORDER — ROCURONIUM BROMIDE 10 MG/ML (PF) SYRINGE
PREFILLED_SYRINGE | INTRAVENOUS | Status: DC | PRN
  Administered 2021-01-02: 70 mg via INTRAVENOUS

## 2021-01-02 SURGICAL SUPPLY — 44 items
BAG COUNTER SPONGE SURGICOUNT (BAG) ×2 IMPLANT
BLADE CLIPPER SURG (BLADE) ×2 IMPLANT
CANISTER SUCT 3000ML PPV (MISCELLANEOUS) ×2 IMPLANT
CHLORAPREP W/TINT 26 (MISCELLANEOUS) ×2 IMPLANT
COVER SURGICAL LIGHT HANDLE (MISCELLANEOUS) ×2 IMPLANT
DECANTER SPIKE VIAL GLASS SM (MISCELLANEOUS) ×4 IMPLANT
DERMABOND ADVANCED (GAUZE/BANDAGES/DRESSINGS) ×1
DERMABOND ADVANCED .7 DNX12 (GAUZE/BANDAGES/DRESSINGS) ×1 IMPLANT
DRAIN CHANNEL 19F RND (DRAIN) ×2 IMPLANT
DRAPE LAPAROSCOPIC ABDOMINAL (DRAPES) ×2 IMPLANT
DRAPE WARM FLUID 44X44 (DRAPES) ×2 IMPLANT
ELECT REM PT RETURN 9FT ADLT (ELECTROSURGICAL) ×2
ELECTRODE REM PT RTRN 9FT ADLT (ELECTROSURGICAL) ×1 IMPLANT
EVACUATOR SILICONE 100CC (DRAIN) ×2 IMPLANT
GAUZE 4X4 16PLY ~~LOC~~+RFID DBL (SPONGE) ×2 IMPLANT
GLOVE SURG ENC MOIS LTX SZ6 (GLOVE) ×2 IMPLANT
GLOVE SURG UNDER LTX SZ6.5 (GLOVE) ×2 IMPLANT
GOWN STRL REUS W/ TWL LRG LVL3 (GOWN DISPOSABLE) ×2 IMPLANT
GOWN STRL REUS W/TWL 2XL LVL3 (GOWN DISPOSABLE) ×2 IMPLANT
GOWN STRL REUS W/TWL LRG LVL3 (GOWN DISPOSABLE) ×2
HEMOSTAT HEMOBLAST BELLOWS (HEMOSTASIS) ×2 IMPLANT
KIT BASIN OR (CUSTOM PROCEDURE TRAY) ×2 IMPLANT
KIT TURNOVER KIT B (KITS) ×2 IMPLANT
L-HOOK LAP DISP 36CM (ELECTROSURGICAL) ×2
LHOOK LAP DISP 36CM (ELECTROSURGICAL) ×1 IMPLANT
NS IRRIG 1000ML POUR BTL (IV SOLUTION) ×2 IMPLANT
PAD ARMBOARD 7.5X6 YLW CONV (MISCELLANEOUS) ×4 IMPLANT
PENCIL BUTTON HOLSTER BLD 10FT (ELECTRODE) ×2 IMPLANT
SCISSORS LAP 5X35 DISP (ENDOMECHANICALS) ×2 IMPLANT
SET IRRIG TUBING LAPAROSCOPIC (IRRIGATION / IRRIGATOR) ×2 IMPLANT
SET TUBE SMOKE EVAC HIGH FLOW (TUBING) ×2 IMPLANT
SLEEVE ENDOPATH XCEL 5M (ENDOMECHANICALS) ×6 IMPLANT
SOL ANTI FOG 6CC (MISCELLANEOUS) ×1 IMPLANT
SOLUTION ANTI FOG 6CC (MISCELLANEOUS) ×1
SPONGE T-LAP 18X18 ~~LOC~~+RFID (SPONGE) ×2 IMPLANT
SUT ETHILON 2 0 FS 18 (SUTURE) ×2 IMPLANT
SUT MNCRL AB 4-0 PS2 18 (SUTURE) ×4 IMPLANT
TOWEL GREEN STERILE (TOWEL DISPOSABLE) ×2 IMPLANT
TOWEL GREEN STERILE FF (TOWEL DISPOSABLE) ×2 IMPLANT
TRAY LAPAROSCOPIC MC (CUSTOM PROCEDURE TRAY) ×2 IMPLANT
TROCAR XCEL BLUNT TIP 100MML (ENDOMECHANICALS) ×2 IMPLANT
TROCAR XCEL NON-BLD 11X100MML (ENDOMECHANICALS) ×2 IMPLANT
TROCAR XCEL NON-BLD 5MMX100MML (ENDOMECHANICALS) ×2 IMPLANT
WARMER LAPAROSCOPE (MISCELLANEOUS) ×2 IMPLANT

## 2021-01-02 NOTE — Interval H&P Note (Signed)
History and Physical Interval Note:  01/02/2021 7:29 AM  Steven Patton  has presented today for surgery, with the diagnosis of RECURRENT ABSCESS AFTER LAP CHOLE.  The various methods of treatment have been discussed with the patient and family. After consideration of risks, benefits and other options for treatment, the patient has consented to  Procedure(s): LAPAROSCOPIC WASHOUT OF ABDOMINAL ABSCESS AND REMOVAL OF RETAINED GALLSTONES (N/A) as a surgical intervention.  The patient's history has been reviewed, patient examined, no change in status, stable for surgery.  I have reviewed the patient's chart and labs.  Questions were answered to the patient's satisfaction.     [name]

## 2021-01-02 NOTE — Op Note (Signed)
PRE-OPERATIVE DIAGNOSIS: Recurrent RUQ abscesses after laparoscopic cholecystectomy  INDICATIONS: Patient underwent laparoscopic cholecystectomy at [facility] in February 2022.  He had gangrenous cholecystitis and operative notes indicate that it was a difficult procedure.  He had a drain left at surgery.  He did not have any evidence of bile leak and the drain was pulled.  Since that time, he has had 4 abscesses requiring percutaneous drains.  The last time the drain was pulled, within 2 weeks pus was draining from the drain site and CT showed a 6 cm abscess.  Repeat perc drain was placed.  His surgeon recommended reoperation for probable retained gallstones, but he wanted another opinion.  His primary care doctor referred him to see me.  Based on the recurrence of the abscesses and infectious disease consult, I agreed that he most likely had retained gallstones.  They were not immediately visible on MRI, but there was some potential debris present.  After extensive discussion with the patient and his family regarding risks of the procedure including having to have an open operation, he wanted to proceed.  POST-OPERATIVE DIAGNOSIS:  Same  PROCEDURE:  Procedure(s): Laparoscopic lysis of adhesions and retrieval of retained gallstones  SURGEON:  Surgeon(s): [name], MD  ASSISTANT: Sophronia Simas, MD  ANESTHESIA:   local and general  DRAINS: (19 Fr ) Blake drain(s) in the RUQ    LOCAL MEDICATIONS USED:  BUPIVICAINE  and LIDOCAINE   SPECIMEN:  No Specimen  DISPOSITION OF SPECIMEN:  N/A  COUNTS:  YES  DICTATION: .Dragon Dictation  PLAN OF CARE: Discharge to home after PACU  PATIENT DISPOSITION:  PACU - hemodynamically stable.  FINDINGS:  retained gallstones at site of most recent drain. See photo below  EBL: 25 mL  PROCEDURE:  Patient was identified in the holding area and taken to the operating room where he was placed supine on the operating room table.  General endotracheal  anesthesia was induced.  Sequential compression devices were placed on the legs prior to induction.  A Foley catheter was placed as well as active warming.  The abdomen was prepped and draped in sterile fashion.  The far right lateral drain was prepped into the field.  A timeout was performed according to the surgical safety checklist.  When all was correct, we continued.  The previous supraumbilical incision from his laparoscopic cholecystectomy was selected for a [place] port.  Local anesthetic was infiltrated and a vertical incision was made with #11 blade.  The subcutaneous tissues were spread with a Kelly clamp.  The fascia was grasped with 2 Kocher clamps and the fascial incision was made with a #11 blade.  A 0 Vicryl pursestring suture was placed around the fascial incision.  The tails of the suture were used to hold the Cedar trocar in place.  Pneumoperitoneum was achieved to a pressure of 15 mmHg.  A 5 mm port was placed in the epigastric location.  2 5 mm ports were placed in the right upper quadrant and an additional 5 mm port was placed just right of the upper midline.  There were significant adhesions of the liver to the diaphragm anteriorly.  These were taken down sharply with scissors.  The inferior aspect of the liver was then identified at the gallbladder fossa.  This was taken down with a combination of cautery and scissors, taking care to avoid the visceral structures.  The right colon and hepatic flexure were reflected downward.  The posterior aspect of the liver was opened up off  the retroperitoneum.  When we got to the far lateral location, the percutaneous drain was visible.  There were several visible gallstones at this location.  The epigastric port was upsized to an 11 mm port.  The stone grasper was used to remove the visible gallstones.  The percutaneous drain was removed.  Several additional gallstones were visible.  The large suction tip was used to wash and suction out this  area to retrieve any smaller gallstones that might be present.  Once the area appeared completely free of debris, there was additional irrigation with the smaller clean suction tip.  Hemoblast was placed in this location for potential oozing from the liver.  There was not significant blood loss.    A 19 [place] Blake drain was placed in the right upper quadrant under the liver at the location of the previous abscess pulled out through the lateral right upper quadrant port site.  This was secured with a 2-0 nylon.  The remainder of the ports were removed while suctioning out the pneumoperitoneum.  The pursestring was tied down at the supraumbilical port.  There was a small amount of residual fascial defect and this was closed with additional 0 Vicryl simple suture.  This corrected the defect.  The skin of all the remaining ports was closed with 4-0 Monocryl in subcuticular fashion.  The wounds were cleaned, dried, and dressed with Dermabond.  Needle, sponge, and instrument counts were correct.  The Foley catheter was removed.  The patient was allowed to emerge from anesthesia and was taken to the PACU in stable condition.

## 2021-01-02 NOTE — Discharge Instructions (Addendum)
[facility] Office Phone Number [phone]   POST OP INSTRUCTIONS  Always review your discharge instruction sheet given to you by the facility where your surgery was performed.  IF YOU HAVE DISABILITY OR FAMILY LEAVE FORMS, YOU MUST BRING THEM TO THE OFFICE FOR PROCESSING.  DO NOT GIVE THEM TO YOUR DOCTOR.  A prescription for pain medication may be given to you upon discharge.  Take your pain medication as prescribed, if needed.  If narcotic pain medicine is not needed, then you may take acetaminophen (Tylenol) or ibuprofen (Advil) as needed. Take your usually prescribed medications unless otherwise directed If you need a refill on your pain medication, please contact your pharmacy.  They will contact our office to request authorization.  Prescriptions will not be filled after 5pm or on week-ends. You should eat very light the first 24 hours after surgery, such as soup, crackers, pudding, etc.  Resume your normal diet the day after surgery It is common to experience some constipation if taking pain medication after surgery.  Increasing fluid intake and taking a stool softener will usually help or prevent this problem from occurring.  A mild laxative (Milk of Magnesia or Miralax) should be taken according to package directions if there are no bowel movements after 48 hours. You may shower in 48 hours.  The surgical glue will flake off in 2-3 weeks.   ACTIVITIES:  No strenuous activity or heavy lifting for 2 weeks.   You may drive when you no longer are taking prescription pain medication, you can comfortably wear a seatbelt, and you can safely maneuver your car and apply brakes. RETURN TO WORK:  __________n/a_______________ You should see your doctor in the office for a follow-up appointment approximately three-four weeks after your surgery.    WHEN TO CALL YOUR DOCTOR: Fever over 101.0 Nausea and/or vomiting. Extreme swelling or bruising. Continued bleeding from  incision. Increased pain, redness, or drainage from the incision.  The clinic staff is available to answer your questions during regular business hours.  Please don't hesitate to call and ask to speak to one of the nurses for clinical concerns.  If you have a medical emergency, go to the nearest emergency room or call 911.  A surgeon from [facility] is always on call at the hospital.  For further questions, please visit [email or web address]   

## 2021-01-02 NOTE — Anesthesia Postprocedure Evaluation (Signed)
Anesthesia Post Note  Patient: Steven Patton  Procedure(s) Performed: LAPAROSCOPIC WASHOUT OF ABDOMINAL ABSCESS AND REMOVAL OF RETAINED GALLSTONES     Patient location during evaluation: PACU Anesthesia Type: General Level of consciousness: awake Pain management: pain level controlled Vital Signs Assessment: post-procedure vital signs reviewed and stable Respiratory status: spontaneous breathing Postop Assessment: no apparent nausea or vomiting Anesthetic complications: no   No notable events documented.  Last Vitals:  Vitals:   01/02/21 1010 01/02/21 1025  BP: (!) 98/53 (!) 105/52  Pulse: (!) 56 (!) 59  Resp: 18 (!) 23  Temp:  (!) 36.2 C  SpO2: 97% 99%    Last Pain:  Vitals:   01/02/21 1025  TempSrc:   PainSc: 0-No pain                 Emmagene Ortner

## 2021-01-02 NOTE — Anesthesia Procedure Notes (Signed)
Procedure Name: Intubation Date/Time: 01/02/2021 7:47 AM Performed by: Nils Pyle, CRNA Pre-anesthesia Checklist: Patient identified, Emergency Drugs available, Suction available and Patient being monitored Patient Re-evaluated:Patient Re-evaluated prior to induction Oxygen Delivery Method: Circle System Utilized Preoxygenation: Pre-oxygenation with 100% oxygen Induction Type: IV induction and Rapid sequence Laryngoscope Size: Miller and 2 Grade View: Grade I Tube type: Oral Tube size: 7.5 mm Number of attempts: 1 Airway Equipment and Method: Stylet and Oral airway Placement Confirmation: ETT inserted through vocal cords under direct vision, positive ETCO2 and breath sounds checked- equal and bilateral Secured at: 23 cm Tube secured with: Tape Dental Injury: Teeth and Oropharynx as per pre-operative assessment

## 2021-01-02 NOTE — Transfer of Care (Signed)
Immediate Anesthesia Transfer of Care Note  Patient: Steven Patton  Procedure(s) Performed: LAPAROSCOPIC WASHOUT OF ABDOMINAL ABSCESS AND REMOVAL OF RETAINED GALLSTONES  Patient Location: PACU  Anesthesia Type:General  Level of Consciousness: awake, alert  and oriented  Airway & Oxygen Therapy: Patient Spontanous Breathing  Post-op Assessment: Report given to RN, Post -op Vital signs reviewed and stable and Patient moving all extremities X 4  Post vital signs: Reviewed and stable  Last Vitals:  Vitals Value Taken Time  BP    Temp 36.5 C 01/02/21 0925  Pulse 80 01/02/21 0925  Resp    SpO2      Last Pain:  Vitals:   01/02/21 0613  TempSrc:   PainSc: 0-No pain         Complications: No notable events documented.

## 2021-01-02 NOTE — Progress Notes (Signed)
1140 Dr Donell Beers at bedside to eval drainage at [facility] drain site reports patient can continue with process to go home requested dressing be changed prior to discharge.

## 2021-01-03 ENCOUNTER — Telehealth: Payer: Self-pay

## 2021-01-03 ENCOUNTER — Encounter (HOSPITAL_COMMUNITY): Payer: Self-pay | Admitting: General Surgery

## 2021-01-03 NOTE — Telephone Encounter (Signed)
Transition Care Management Unsuccessful Follow-up Telephone Call  Date of discharge and from where:  01/02/2021 from [facility]  Attempts:  1st Attempt  Reason for unsuccessful TCM follow-up call:  Unable to leave message

## 2021-01-06 NOTE — Telephone Encounter (Signed)
Transition Care Management Follow-up Telephone Call Date of discharge and from where: 01/02/2021 from [facility] How have you been since you were released from the hospital? Spoke to pt dtr Steven Patton) and she stated that pt is doing well.  Any questions or concerns? No  Items Reviewed: Did the pt receive and understand the discharge instructions provided? Yes  Medications obtained and verified? Yes  Other? No  Any new allergies since your discharge? No  Dietary orders reviewed? No Do you have support at home? Yes   Functional Questionnaire: (I = Independent and D = Dependent) ADLs: I Bathing/Dressing- I Meal Prep- I Eating- I Maintaining continence- I Transferring/Ambulation- non ambulatory - uses WC.  Managing Meds- I   Follow up appointments reviewed:  PCP Hospital f/u appt confirmed? Yes  Scheduled to see Dr. Kreg Shropshire on 02/29/2020 @ 2:45pm. Specialist Hospital f/u appt confirmed? Yes  Scheduled to see Dr. Donell Beers on 02/08/2020 @ 09:30am. Are transportation arrangements needed? No  If their condition worsens, is the pt aware to call PCP or go to the Emergency Dept.? Yes Was the patient provided with contact information for the PCP's office or ED? Yes Was to pt encouraged to call back with questions or concerns? Yes

## 2021-01-13 NOTE — Discharge Summary (Signed)
Physician Discharge Summary  Patient ID: Steven Patton MRN: [id number] DOB/AGE: 06/27/1935 85 y.o.  Admit date: 01/02/2021 Discharge date: 01/13/2021  Admission Diagnoses: Retained gallstones with recurrent intraabdominal abscesses  Discharge Diagnoses:  Principal Problem:   Postoperative intra-abdominal abscess Active Problems:   History of laparoscopic cholecystectomy   Intra-abdominal abscess post-procedure   Discharged Condition: stable  Hospital Course: Pt was taken to the OR for diagnostic laparoscopy and removal of presumed retained gallstones 01/02/21.  The patient had lap chole 03/2020 at [facility] and had multiple recurrent RUQ abscesses.  Given the quick recurrence, it was presumed that he had retained stones.  MR was suspicious for this, but wasn't definitively diagnostic.  Fortunately on dx laparoscopy, he was found to have multiple gallstones at the site of the perc drain still left in place.  This was cleaned out and irrigated.  The prior drain was removed.  A surgical drain was left.  He was doing well in the PACU with minimal pain.  He was discharged in stable condition from the recovery room.   Consults: None  Significant Diagnostic Studies: n/a  Treatments: surgery: see above  Discharge Exam: Blood pressure (!) 105/52, pulse (!) 59, temperature (!) 97.1 F (36.2 C), resp. rate (!) 23, SpO2 99 %. General appearance: alert, cooperative, and no distress Resp: breathing comfortably GI: soft, approp tender at incisions.  Drain serosang Extremities: extremities normal, atraumatic, no cyanosis or edema  Disposition: Discharge disposition: 01-Home or Self Care       Discharge Instructions     Call MD for:  difficulty breathing, headache or visual disturbances   Complete by: As directed    Call MD for:  hives   Complete by: As directed    Call MD for:  persistant nausea and vomiting   Complete by: As directed    Call MD for:  redness, tenderness, or signs of  infection (pain, swelling, redness, odor or green/yellow discharge around incision site)   Complete by: As directed    Call MD for:  severe uncontrolled pain   Complete by: As directed    Call MD for:  temperature >100.4   Complete by: As directed    Diet - low sodium heart healthy   Complete by: As directed    Increase activity slowly   Complete by: As directed       Allergies as of 01/02/2021   No Known Allergies      Medication List     TAKE these medications    allopurinol 300 MG tablet Commonly known as: ZYLOPRIM TAKE 1 TABLET TWICE DAILY   AMBULATORY NON FORMULARY MEDICATION Knee-high, firm compression, custom made, graduated compression stockings. Apply to lower extremities.   AMBULATORY NON FORMULARY MEDICATION Light weight wheel chair.   atorvastatin 40 MG tablet Commonly known as: LIPITOR TAKE 1 TABLET AT BEDTIME   blood glucose meter kit and supplies Kit Dispense based on patient and insurance preference. Use up to four times daily as directed. (FOR ICD-9 250.00, 250.01).   diphenhydrAMINE 25 MG tablet Commonly known as: BENADRYL Take 25 mg by mouth at bedtime.   gabapentin 600 MG tablet Commonly known as: NEURONTIN TAKE 1 TABLET THREE TIMES DAILY What changed: when to take this   linezolid 600 MG tablet Commonly known as: ZYVOX Take 1 tablet (600 mg total) by mouth 2 (two) times daily.   meloxicam 15 MG tablet Commonly known as: MOBIC TAKE 1 TABLET EVERY DAY   metFORMIN 500 MG tablet Commonly known as:  GLUCOPHAGE TAKE 1 TABLET EVERY DAY WITH BREAKFAST   nitroGLYCERIN 0.4 MG SL tablet Commonly known as: NITROSTAT Place 0.4 mg under the tongue every 5 (five) minutes as needed for chest pain.   oxyCODONE 5 MG immediate release tablet Commonly known as: Oxy IR/ROXICODONE Take 0.5-1 tablets (2.5-5 mg total) by mouth every 4 (four) hours as needed for severe pain.   spironolactone 25 MG tablet Commonly known as: ALDACTONE Take 1 tablet (25  mg total) by mouth daily.   torsemide 10 MG tablet Commonly known as: DEMADEX Take 1 tablet (10 mg total) by mouth daily.   torsemide 20 MG tablet Commonly known as: [place] 1 TABLET EVERY DAY        Follow-up Information     Stark Klein, MD Follow up in 2 week(s).   Specialty: General Surgery Contact information: [address] [place] [address] [phone]                 Signed: Stark Klein 01/13/2021, 11:11 PM

## 2021-02-28 ENCOUNTER — Ambulatory Visit: Payer: Medicare FFS | Admitting: Sports Medicine

## 2021-02-28 ENCOUNTER — Other Ambulatory Visit: Payer: Self-pay

## 2021-02-28 ENCOUNTER — Encounter: Payer: Self-pay | Admitting: Sports Medicine

## 2021-02-28 VITALS — BP 134/70 | HR 71

## 2021-02-28 DIAGNOSIS — E1169 Type 2 diabetes mellitus with other specified complication: Secondary | ICD-10-CM | POA: Diagnosis not present

## 2021-02-28 DIAGNOSIS — E669 Obesity, unspecified: Secondary | ICD-10-CM | POA: Diagnosis not present

## 2021-02-28 LAB — POCT GLYCOSYLATED HEMOGLOBIN (HGB A1C): HbA1c, POC (controlled diabetic range): 5.2 % (ref 0.0–7.0)

## 2021-02-28 NOTE — Progress Notes (Signed)
° ° °  Procedures performed today:    None.  Independent interpretation of notes and tests performed by another provider:   None.  Brief History, Exam, Impression, and Recommendations:    Diabetes mellitus type 2 in obese (HCC) A1c down to 5.2%. He did have his abdominal exploration laparoscopically with washout, multiple retained gallstones were noted, clinically he has improved dramatically, so as he regains weight we will need to continue to keep an eye on his A1c, return in 3-6 months for A1c recheck.    ___________________________________________ Gwen Her. Dianah Field, M.D., ABFM., CAQSM. Primary Care and [place] Instructor of [place] [employer] [facility] of Medicine

## 2021-02-28 NOTE — Assessment & Plan Note (Signed)
A1c down to 5.2%. He did have his abdominal exploration laparoscopically with washout, multiple retained gallstones were noted, clinically he has improved dramatically, so as he regains weight we will need to continue to keep an eye on his A1c, return in 3-6 months for A1c recheck.

## 2021-03-25 ENCOUNTER — Other Ambulatory Visit: Payer: Self-pay | Admitting: Sports Medicine

## 2021-03-25 DIAGNOSIS — M1A0720 Idiopathic chronic gout, left ankle and foot, without tophus (tophi): Secondary | ICD-10-CM

## 2021-03-25 DIAGNOSIS — R601 Generalized edema: Secondary | ICD-10-CM

## 2021-03-25 DIAGNOSIS — R600 Localized edema: Secondary | ICD-10-CM

## 2021-03-25 DIAGNOSIS — M109 Gout, unspecified: Secondary | ICD-10-CM

## 2021-05-01 ENCOUNTER — Other Ambulatory Visit: Payer: Self-pay | Admitting: Orthopedic Surgery

## 2021-05-01 DIAGNOSIS — R2230 Localized swelling, mass and lump, unspecified upper limb: Secondary | ICD-10-CM

## 2021-05-17 ENCOUNTER — Other Ambulatory Visit: Payer: Medicare FFS

## 2021-05-17 ENCOUNTER — Ambulatory Visit
Admission: RE | Admit: 2021-05-17 | Discharge: 2021-05-17 | Disposition: A | Discharge status: Home / Self Care | Payer: Medicare FFS | Source: Ambulatory Visit | Attending: Orthopedic Surgery | Admitting: Orthopedic Surgery

## 2021-05-17 DIAGNOSIS — R2230 Localized swelling, mass and lump, unspecified upper limb: Secondary | ICD-10-CM

## 2021-05-20 ENCOUNTER — Other Ambulatory Visit (HOSPITAL_COMMUNITY): Payer: Self-pay | Admitting: Orthopedic Surgery

## 2021-05-20 DIAGNOSIS — R2230 Localized swelling, mass and lump, unspecified upper limb: Secondary | ICD-10-CM

## 2021-05-30 ENCOUNTER — Ambulatory Visit (INDEPENDENT_AMBULATORY_CARE_PROVIDER_SITE_OTHER): Payer: Medicare FFS | Admitting: Sports Medicine

## 2021-05-30 VITALS — BP 108/72 | HR 86 | Ht 69.0 in | Wt 270.0 lb

## 2021-05-30 DIAGNOSIS — E669 Obesity, unspecified: Secondary | ICD-10-CM

## 2021-05-30 DIAGNOSIS — G959 Disease of spinal cord, unspecified: Secondary | ICD-10-CM | POA: Diagnosis not present

## 2021-05-30 DIAGNOSIS — R601 Generalized edema: Secondary | ICD-10-CM | POA: Diagnosis not present

## 2021-05-30 DIAGNOSIS — R600 Localized edema: Secondary | ICD-10-CM

## 2021-05-30 DIAGNOSIS — M5412 Radiculopathy, cervical region: Secondary | ICD-10-CM

## 2021-05-30 DIAGNOSIS — E1169 Type 2 diabetes mellitus with other specified complication: Secondary | ICD-10-CM | POA: Diagnosis not present

## 2021-05-30 LAB — POCT GLYCOSYLATED HEMOGLOBIN (HGB A1C): Hemoglobin A1C: 5.4 % (ref 4.0–5.6)

## 2021-05-30 MED ORDER — TORSEMIDE 10 MG PO TABS: 10.0000 mg | ORAL_TABLET | Freq: Every day | ORAL | 3 refills | Status: DC

## 2021-05-30 NOTE — Progress Notes (Signed)
? ? ?  Procedures performed today:   ? ?None. ? ?Independent interpretation of notes and tests performed by another provider:  ? ?MRI from 2021 personally viewed, he does have an excellent decompression from C4-C7, he still has a few areas of myelomalacia at approximately the C6 level. ? ?Brief History, Exam, Impression, and Recommendations:   ? ?Diabetes mellitus type 2 in obese [facility]) ?Hemoglobin A1c is 5.4%, well controlled, continue metformin, we will need to get a urine microalbumin but he will have to come back next week for that. ? ?Cervical myelopathy with cervical radiculopathy ?Steven Patton did have cervical myelopathy/spinal cord compression post multilevel decompression, his most recent MRI from 2021 did show persistent T2 signal/myelomalacia in the spinal cord, he does have weakness to extension of his fourth and fifth fingers bilaterally, extensor apparatus feels intact on exam, it sounds like Dr. Luiz Blare has referred him to Dr. Janee Morn with hand surgery for further evaluation, I think he should keep this appointment but his symptoms are likely radicular, on further questioning he tells me he is functional and he would not want additional intervention into his cervical spine so we will simply watch this for now. ? ? ? ?___________________________________________ ?Steven Patton. [name], M.D., ABFM., CAQSM. ?Primary Care and Sports Medicine ?[facility] ? ?Adjunct Instructor of Family Medicine  ?[employer] ?

## 2021-05-30 NOTE — Assessment & Plan Note (Signed)
Hemoglobin A1c is 5.4%, well controlled, continue metformin, we will need to get a urine microalbumin but he will have to come back next week for that. ?

## 2021-05-30 NOTE — Assessment & Plan Note (Addendum)
Mickey did have cervical myelopathy/spinal cord compression post multilevel decompression, his most recent MRI from 2021 did show persistent T2 signal/myelomalacia in the spinal cord, he does have weakness to extension of his fourth and fifth fingers bilaterally, extensor apparatus feels intact on exam, it sounds like Dr. Berenice Primas has referred him to Dr. Grandville Silos with hand surgery for further evaluation, I think he should keep this appointment but his symptoms are likely radicular, on further questioning he tells me he is functional and he would not want additional intervention into his cervical spine so we will simply watch this for now. ?

## 2021-06-05 LAB — MICROALBUMIN / CREATININE URINE RATIO
Creatinine, Urine: 40 mg/dL (ref 20–320)
Microalb Creat Ratio: 18 mcg/mg creat (ref <30)
Microalb, Ur: 0.7 mg/dL

## 2021-06-19 ENCOUNTER — Ambulatory Visit (HOSPITAL_COMMUNITY): Admission: RE | Admit: 2021-06-19 | Payer: Medicare PPO | Source: Ambulatory Visit

## 2021-06-19 ENCOUNTER — Encounter (HOSPITAL_COMMUNITY): Payer: Self-pay

## 2021-06-19 ENCOUNTER — Ambulatory Visit (HOSPITAL_COMMUNITY): Payer: Medicare PPO

## 2021-08-28 ENCOUNTER — Other Ambulatory Visit: Payer: Self-pay | Admitting: Sports Medicine

## 2021-08-28 DIAGNOSIS — E1165 Type 2 diabetes mellitus with hyperglycemia: Secondary | ICD-10-CM

## 2021-08-28 DIAGNOSIS — E876 Hypokalemia: Secondary | ICD-10-CM

## 2021-08-28 DIAGNOSIS — E1169 Type 2 diabetes mellitus with other specified complication: Secondary | ICD-10-CM

## 2021-09-10 ENCOUNTER — Encounter (INDEPENDENT_AMBULATORY_CARE_PROVIDER_SITE_OTHER): Payer: Self-pay

## 2021-11-10 ENCOUNTER — Other Ambulatory Visit: Payer: Self-pay | Admitting: Sports Medicine

## 2021-12-02 ENCOUNTER — Ambulatory Visit (INDEPENDENT_AMBULATORY_CARE_PROVIDER_SITE_OTHER): Payer: Medicare FFS | Admitting: Sports Medicine

## 2021-12-02 ENCOUNTER — Encounter: Payer: Self-pay | Admitting: Sports Medicine

## 2021-12-02 VITALS — BP 125/77 | HR 76 | Wt 270.0 lb

## 2021-12-02 DIAGNOSIS — E1169 Type 2 diabetes mellitus with other specified complication: Secondary | ICD-10-CM | POA: Diagnosis not present

## 2021-12-02 DIAGNOSIS — Z23 Encounter for immunization: Secondary | ICD-10-CM | POA: Diagnosis not present

## 2021-12-02 DIAGNOSIS — E669 Obesity, unspecified: Secondary | ICD-10-CM

## 2021-12-02 LAB — POCT GLYCOSYLATED HEMOGLOBIN (HGB A1C): Hemoglobin A1C: 6 % — AB (ref 4.0–5.6)

## 2021-12-02 NOTE — Progress Notes (Signed)
    Procedures performed today:    None.  Independent interpretation of notes and tests performed by another provider:   None.  Brief History, Exam, Impression, and Recommendations:    Diabetes mellitus type 2 in obese ([place]) Well-controlled, up-to-date on screenings, return in 6 months. Flu shot today.  I spent 30 minutes of total time managing this patient today, this includes chart review, face to face, and non-face to face time.  ____________________________________________ Gwen Her. Dianah Field, M.D., ABFM., CAQSM., AME. Primary Care and Sports Medicine [facility]  Adjunct Professor of [place] of [facility] [place] of Medicine  Risk manager

## 2021-12-02 NOTE — Assessment & Plan Note (Signed)
Well-controlled, up-to-date on screenings, return in 6 months. Flu shot today.

## 2021-12-15 ENCOUNTER — Telehealth: Payer: Self-pay | Admitting: General Practice

## 2021-12-15 NOTE — Telephone Encounter (Signed)
Patient was scheduled for a telephone medicare wellness visit for 2pm on 12/15/21. Attempted to call patient x 3 on both numbers and left multiple voicemail's. Unable to get in touch with patient. Patient marked as no-show.

## 2021-12-18 ENCOUNTER — Telehealth: Payer: Self-pay | Admitting: Sports Medicine

## 2021-12-18 NOTE — Telephone Encounter (Signed)
Paperwork received dropped off from patient to have completed by Dr. Rhae Hammock will place in mailbox.

## 2021-12-22 ENCOUNTER — Other Ambulatory Visit: Payer: Self-pay | Admitting: Sports Medicine

## 2021-12-22 DIAGNOSIS — I10 Essential (primary) hypertension: Secondary | ICD-10-CM

## 2021-12-22 DIAGNOSIS — M1A0720 Idiopathic chronic gout, left ankle and foot, without tophus (tophi): Secondary | ICD-10-CM

## 2021-12-22 DIAGNOSIS — M109 Gout, unspecified: Secondary | ICD-10-CM

## 2021-12-22 NOTE — Telephone Encounter (Signed)
Done and sent to get scanned and then faxed off

## 2022-02-03 ENCOUNTER — Encounter: Payer: Self-pay | Admitting: Sports Medicine

## 2022-02-03 ENCOUNTER — Encounter (INDEPENDENT_AMBULATORY_CARE_PROVIDER_SITE_OTHER): Payer: Medicare FFS | Admitting: Sports Medicine

## 2022-02-03 ENCOUNTER — Other Ambulatory Visit: Payer: Self-pay

## 2022-02-03 DIAGNOSIS — U071 COVID-19: Secondary | ICD-10-CM

## 2022-02-03 MED ORDER — NIRMATRELVIR/RITONAVIR (PAXLOVID)TABLET: ORAL_TABLET | ORAL | 0 refills | Status: DC

## 2022-02-03 NOTE — Telephone Encounter (Signed)
I spent 5 total minutes of online digital evaluation and management services in this patient-initiated request for online care. 

## 2022-02-03 NOTE — Assessment & Plan Note (Signed)
History of COVID back in 2022, tested positive again yesterday, moderate symptoms, adding Paxlovid.

## 2022-04-05 ENCOUNTER — Other Ambulatory Visit: Payer: Self-pay | Admitting: Sports Medicine

## 2022-04-05 DIAGNOSIS — R600 Localized edema: Secondary | ICD-10-CM

## 2022-04-05 DIAGNOSIS — R601 Generalized edema: Secondary | ICD-10-CM

## 2022-05-12 ENCOUNTER — Other Ambulatory Visit: Payer: Self-pay | Admitting: Sports Medicine

## 2022-06-02 ENCOUNTER — Ambulatory Visit: Payer: Medicare PPO | Admitting: Sports Medicine

## 2022-06-09 ENCOUNTER — Encounter: Payer: Self-pay | Admitting: Sports Medicine

## 2022-06-09 ENCOUNTER — Other Ambulatory Visit: Payer: Self-pay

## 2022-06-09 ENCOUNTER — Telehealth: Payer: Self-pay | Admitting: Sports Medicine

## 2022-06-09 DIAGNOSIS — R601 Generalized edema: Secondary | ICD-10-CM

## 2022-06-09 DIAGNOSIS — I10 Essential (primary) hypertension: Secondary | ICD-10-CM

## 2022-06-09 DIAGNOSIS — R600 Localized edema: Secondary | ICD-10-CM

## 2022-06-09 NOTE — Telephone Encounter (Signed)
Called patient to schedule Medicare Annual Wellness Visit (AWV). Left message for patient to call back and schedule Medicare Annual Wellness Visit (AWV).  Last date of AWV: 2022  Please schedule an appointment at any time with [facility].  If any questions, please contact me at [phone].  Thank you ,  Morgan Jessup Patient Access Advocate II Direct Dial: [phone]   

## 2022-06-11 ENCOUNTER — Encounter: Payer: Self-pay | Admitting: Sports Medicine

## 2022-06-11 DIAGNOSIS — I10 Essential (primary) hypertension: Secondary | ICD-10-CM

## 2022-06-11 DIAGNOSIS — R600 Localized edema: Secondary | ICD-10-CM

## 2022-06-11 DIAGNOSIS — M1A0720 Idiopathic chronic gout, left ankle and foot, without tophus (tophi): Secondary | ICD-10-CM

## 2022-06-11 DIAGNOSIS — M109 Gout, unspecified: Secondary | ICD-10-CM

## 2022-06-11 DIAGNOSIS — R601 Generalized edema: Secondary | ICD-10-CM

## 2022-06-11 MED ORDER — GABAPENTIN 600 MG PO TABS: 600.0000 mg | ORAL_TABLET | Freq: Three times a day (TID) | ORAL | 3 refills | Status: DC

## 2022-06-11 MED ORDER — TORSEMIDE 10 MG PO TABS: 10.0000 mg | ORAL_TABLET | Freq: Every day | ORAL | 3 refills | Status: DC

## 2022-06-11 MED ORDER — ATORVASTATIN CALCIUM 40 MG PO TABS: 40.0000 mg | ORAL_TABLET | Freq: Every day | ORAL | 2 refills | Status: DC

## 2022-06-30 MED ORDER — ALLOPURINOL 300 MG PO TABS: 300.0000 mg | ORAL_TABLET | Freq: Two times a day (BID) | ORAL | 3 refills | Status: DC

## 2022-06-30 NOTE — Addendum Note (Signed)
Addended by: Monica Becton on: 06/30/2022 01:06 PM   Modules accepted: Orders

## 2022-07-15 ENCOUNTER — Encounter: Payer: Self-pay | Admitting: Sports Medicine

## 2022-07-15 NOTE — Telephone Encounter (Signed)
Patient scheduled.

## 2022-07-17 ENCOUNTER — Ambulatory Visit (INDEPENDENT_AMBULATORY_CARE_PROVIDER_SITE_OTHER): Payer: Medicare FFS | Admitting: Sports Medicine

## 2022-07-17 ENCOUNTER — Encounter: Payer: Self-pay | Admitting: Sports Medicine

## 2022-07-17 VITALS — BP 123/88 | HR 100

## 2022-07-17 DIAGNOSIS — G4733 Obstructive sleep apnea (adult) (pediatric): Secondary | ICD-10-CM

## 2022-07-17 DIAGNOSIS — L570 Actinic keratosis: Secondary | ICD-10-CM | POA: Diagnosis not present

## 2022-07-17 DIAGNOSIS — L731 Pseudofolliculitis barbae: Secondary | ICD-10-CM

## 2022-07-17 MED ORDER — DOXYCYCLINE HYCLATE 100 MG PO TABS
100.0000 mg | ORAL_TABLET | Freq: Two times a day (BID) | ORAL | 0 refills | Status: AC
Start: 2022-07-17 — End: 2022-07-24

## 2022-07-17 MED ORDER — TRETINOIN 0.05 % EX GEL: CUTANEOUS | 11 refills | Status: DC

## 2022-07-17 MED ORDER — AMBULATORY NON FORMULARY MEDICATION: 0 refills | Status: AC

## 2022-07-17 NOTE — Progress Notes (Signed)
    Procedures performed today:    Procedure:  Cryodestruction of left-sided AK over the mastoid process Consent obtained and verified. Time-out conducted. Noted no overlying erythema, induration, or other signs of local infection. Completed without difficulty using Cryo-Gun. Advised to call if fevers/chills, erythema, induration, drainage, or persistent bleeding.  Independent interpretation of notes and tests performed by another provider:   None.  Brief History, Exam, Impression, and Recommendations:    Sleep apnea Historically well-controlled obstructive sleep apnea, he does well with CPAP on AutoPap, facemask with humidifier. He does need a new CPAP machine, we will send off a prescription to [facility].  Actinic keratosis Noted AK behind left ear, cryotherapy as above.  Pseudofolliculitis barbae Folliculitis like lesion present for several months left-sided cheek under the beard, adding doxycycline and topical retinoids.    ____________________________________________ [name]. [name], M.D., ABFM., CAQSM., AME. Primary Care and Sports Medicine [facility]  Adjunct Professor of Family Medicine  [place] [employer]  Restaurant manager, fast food

## 2022-07-17 NOTE — Assessment & Plan Note (Signed)
Folliculitis like lesion present for several months left-sided cheek under the beard, adding doxycycline and topical retinoids.

## 2022-07-17 NOTE — Assessment & Plan Note (Signed)
Noted AK behind left ear, cryotherapy as above.

## 2022-07-17 NOTE — Assessment & Plan Note (Signed)
Historically well-controlled obstructive sleep apnea, he does well with CPAP on AutoPap, facemask with humidifier. He does need a new CPAP machine, we will send off a prescription to [facility].

## 2022-08-14 ENCOUNTER — Other Ambulatory Visit: Payer: Self-pay | Admitting: Sports Medicine

## 2022-08-14 ENCOUNTER — Ambulatory Visit (INDEPENDENT_AMBULATORY_CARE_PROVIDER_SITE_OTHER): Payer: Medicare FFS | Admitting: Sports Medicine

## 2022-08-14 DIAGNOSIS — L731 Pseudofolliculitis barbae: Secondary | ICD-10-CM | POA: Diagnosis not present

## 2022-08-14 DIAGNOSIS — L570 Actinic keratosis: Secondary | ICD-10-CM | POA: Diagnosis not present

## 2022-08-14 MED ORDER — TRETINOIN 0.05 % EX GEL: CUTANEOUS | 11 refills | Status: DC

## 2022-08-14 NOTE — Progress Notes (Signed)
    Procedures performed today:    Procedure:  Cryodestruction of AK behind left ear Consent obtained and verified. Time-out conducted. Noted no overlying erythema, induration, or other signs of local infection. Completed without difficulty using Cryo-Gun. Advised to call if fevers/chills, erythema, induration, drainage, or persistent bleeding.  Independent interpretation of notes and tests performed by another provider:   None.  Brief History, Exam, Impression, and Recommendations:    Actinic keratosis AK behind left ear, repeat cryotherapy today, return as needed.  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 We did prescribe doxycycline the last visit. Patient declined trimming his beard.    ____________________________________________ [name]. [name], M.D., ABFM., CAQSM., AME. Primary Care and Sports Medicine [facility]  Adjunct Professor of Family Medicine  [employer] [facility] [employer]  Restaurant manager, fast food

## 2022-08-14 NOTE — Assessment & Plan Note (Signed)
Never ended up getting the tretinoin, sending again. We did prescribe doxycycline the last visit. Patient declined trimming his beard.

## 2022-08-14 NOTE — Assessment & Plan Note (Signed)
AK behind left ear, repeat cryotherapy today, return as needed.

## 2022-08-31 ENCOUNTER — Other Ambulatory Visit: Payer: Self-pay | Admitting: Sports Medicine

## 2022-08-31 DIAGNOSIS — E1165 Type 2 diabetes mellitus with hyperglycemia: Secondary | ICD-10-CM

## 2022-08-31 DIAGNOSIS — E876 Hypokalemia: Secondary | ICD-10-CM

## 2022-08-31 DIAGNOSIS — E1169 Type 2 diabetes mellitus with other specified complication: Secondary | ICD-10-CM

## 2022-11-13 ENCOUNTER — Other Ambulatory Visit: Payer: Self-pay | Admitting: Sports Medicine

## 2023-01-16 ENCOUNTER — Other Ambulatory Visit: Payer: Self-pay | Admitting: Sports Medicine

## 2023-01-16 DIAGNOSIS — I10 Essential (primary) hypertension: Secondary | ICD-10-CM

## 2023-01-25 ENCOUNTER — Other Ambulatory Visit: Payer: Self-pay | Admitting: Sports Medicine

## 2023-01-25 DIAGNOSIS — E876 Hypokalemia: Secondary | ICD-10-CM

## 2023-01-25 DIAGNOSIS — E669 Obesity, unspecified: Secondary | ICD-10-CM

## 2023-01-25 DIAGNOSIS — E1165 Type 2 diabetes mellitus with hyperglycemia: Secondary | ICD-10-CM

## 2023-03-16 ENCOUNTER — Encounter (INDEPENDENT_AMBULATORY_CARE_PROVIDER_SITE_OTHER): Payer: Self-pay | Admitting: Sports Medicine

## 2023-03-16 DIAGNOSIS — L731 Pseudofolliculitis barbae: Secondary | ICD-10-CM

## 2023-03-16 MED ORDER — TRETINOIN 0.05 % EX GEL: CUTANEOUS | 11 refills | Status: AC

## 2023-03-16 NOTE — Telephone Encounter (Signed)

## 2023-04-01 ENCOUNTER — Other Ambulatory Visit: Payer: Self-pay | Admitting: Sports Medicine

## 2023-04-01 DIAGNOSIS — R601 Generalized edema: Secondary | ICD-10-CM

## 2023-04-01 DIAGNOSIS — R600 Localized edema: Secondary | ICD-10-CM

## 2023-04-09 ENCOUNTER — Telehealth: Payer: Self-pay | Admitting: Sports Medicine

## 2023-04-09 NOTE — Telephone Encounter (Signed)
 Steven Patton, patient's daughter called. Patient's oxygen level this morning was 84, around 12:00 it dropped to 80 and at about 1:15 it was 85. I spoke to [place] and she advised that he go to the ER immediately. This message was given to his daughter.

## 2023-04-12 ENCOUNTER — Ambulatory Visit: Payer: Self-pay | Admitting: Sports Medicine

## 2023-04-12 NOTE — Telephone Encounter (Signed)
 Chief Complaint: hypoxia Symptoms: hypoxia, SOB on exertion (with transfers), excessive sleepiness Frequency: symptoms for 1 month, ED visit 3/7 Pertinent Negatives: Patient denies fever, CP, flu-like symptoms, lightheadedness Disposition: [x] ED /[] Urgent Care (no appt availability in office) / [] Appointment(In office/virtual)/ []  [place] [facility]/ [] Home Care/ [x] Refused Recommended Disposition /[] [place] [facility]/ []  Follow-up with PCP Additional Notes: Pt's daughter [name] calls on behalf of pt. Pt has been SOB on exertion (with transfers - pt does not ambulate) for 1 month. Daughter purchased a pulse ox. Pt went to the ED Friday 3/7 because pt's O2 was 80-85. Pt was placed on nasal cannula in the ED and his sat increased to 93. Pt was diagnosed with a possible pneumonia and discharged with azithromycin, cefdinir, and lasix which he has been taking. Pt was not provided O2 at discharge. ED provider asked daughter to have pt follow up with his PCP for oxygen. Pt has a hospital follow-up scheduled for 3/13 but daughter states his O2 at home on room air is 83 at rest. When he is moving during transfers to his wheelchair, his sat is 88. Daughter reports pt has been excessively sleepy for a week and fell asleep while eating dinner last night. RN advised daughter pt needs to go back to the ED. Daughter stated that she does not think that would be helpful seeing as he was not discharged with O2 on Friday 3/7 when they were in the ED. Daughter asked RN if Dr. Karie Schwalbe could see the patient today or provide oxygen.  RN called the CAL. CAL advised RN they would speak to Dr. Karie Schwalbe. Dr. Karie Schwalbe urging pt to go to the ED and states in the meantime he will prescribe oxygen. RN returned to the call with the daughter and the call failed. RN made many attempts to call [name] back and left a voicemail and call back number urging him to go to the ED.   Copied from CRM [id number]. Topic: Clinical - Red Word Triage >> Apr 12, 2023  1:28 PM Nila Nephew wrote: Red Word that prompted transfer to Nurse Triage: Patient O2 saturation is 83 at resting, 88 at highest today. Patient was seen in the ER and ER advised to follow up with PCP but O2 saturation remains low. Reason for Disposition  Oxygen level (e.g., pulse oximetry) 90 percent or lower  Answer Assessment - Initial Assessment Questions 1. RESPIRATORY STATUS: "Describe your breathing?" (e.g., wheezing, shortness of breath, unable to speak, severe coughing)      "I can tell after he got up and got dressed in his wheelchair that his breathing was a little more labored", "I don't notice it as much when he is in his recliner", "he is sleeping all the time for the last week, he can't stay awake" 2. ONSET: "When did this breathing problem begin?"      Intermittent SOB started a month ago, daughter attributed SOB to pt's weight. 3. PATTERN "Does the difficult breathing come and go, or has it been constant since it started?"      Comes and goes, worse on exertion 4. SEVERITY: "How bad is your breathing?" (e.g., mild, moderate, severe)    - MILD: No SOB at rest, mild SOB with walking, speaks normally in sentences, can lie down, no retractions, pulse < 100.    - MODERATE: SOB at rest, SOB with minimal exertion and prefers to sit, cannot lie down flat, speaks in phrases, mild retractions, audible wheezing, pulse 100-120.    -  SEVERE: Very SOB at rest, speaks in single words, struggling to breathe, sitting hunched forward, retractions, pulse > 120      No SOB at rest 5. RECURRENT SYMPTOM: "Have you had difficulty breathing before?" If Yes, ask: "When was the last time?" and "What happened that time?"      In the last month 6. CARDIAC HISTORY: "Do you have any history of heart disease?" (e.g., heart attack, angina, bypass surgery, angioplasty)      HTN, DM T 2 7. LUNG HISTORY: "Do you have any history of lung disease?"  (e.g., pulmonary embolus, asthma, emphysema)     Sleep  apnea 8. CAUSE: "What do you think is causing the breathing problem?"      Not sure  9. OTHER SYMPTOMS: "Do you have any other symptoms? (e.g., dizziness, runny nose, cough, chest pain, fever)     Taking azithromycin, cefdinir, lasix from the ED provider. "Sounds like he has a frog in his throat in the morning", no cough, no CP. No fever. Excessive sleepiness. No lightheadedness. 10. O2 SATURATION MONITOR:  "Do you use an oxygen saturation monitor (pulse oximeter) at home?" If Yes, ask: "What is your reading (oxygen level) today?" "What is your usual oxygen saturation reading?" (e.g., 95%)       83 at rest, 88 with movement  Protocols used: Breathing Difficulty-A-AH

## 2023-04-15 ENCOUNTER — Encounter: Payer: Self-pay | Admitting: Sports Medicine

## 2023-04-15 ENCOUNTER — Ambulatory Visit (INDEPENDENT_AMBULATORY_CARE_PROVIDER_SITE_OTHER): Admitting: Sports Medicine

## 2023-04-15 ENCOUNTER — Ambulatory Visit

## 2023-04-15 VITALS — BP 140/57 | HR 88 | Temp 98.2°F | Resp 20 | Ht 69.0 in | Wt 285.0 lb

## 2023-04-15 DIAGNOSIS — J189 Pneumonia, unspecified organism: Secondary | ICD-10-CM

## 2023-04-15 MED ORDER — CEFTRIAXONE SODIUM 1 G IJ SOLR
1.0000 g | Freq: Once | INTRAMUSCULAR | Status: AC
Start: 2023-04-15 — End: 2023-04-15
  Administered 2023-04-15: 1 g via INTRAMUSCULAR

## 2023-04-15 MED ORDER — PREDNISONE 50 MG PO TABS: ORAL_TABLET | ORAL | 0 refills | Status: DC

## 2023-04-15 NOTE — Patient Instructions (Signed)
 We will get you oxygen at home, 2 L nasal cannula, increase flow rate to keep oxygen saturation level above 92%.

## 2023-04-15 NOTE — Addendum Note (Signed)
 Addended by: Samule Dry on: 04/15/2023 04:01 PM   Modules accepted: Orders

## 2023-04-15 NOTE — Assessment & Plan Note (Addendum)
 Pleasant 88 year old male, multiple medical problems, recent visit in the emergency department, hypoxic down to 81 at rest on room air. Ultimately CT scan she did show bibasilar pneumonia, treated with azithromycin, third-generation cephalosporin. Was discharged without oxygen, desaturated again at home on room air to 81% at rest. We did send him back to the ED, he did not ago, he was able to borrow an oxygen concentrator from a neighbor and he is satting approximately 90 to 91% here at rest on 2 L. His lungs are somewhat coarse. He is however speaking full sentence, no accessory muscle use, no nasal flaring. Vitals are stable. We will continue the course of treatment, he will need oxygen, I will try to set him up with a tank at 2 L and a concentrator for home use. We will do a shot of Rocephin, he is finished with his antibiotics from the hospital, 5 days of prednisone, I would like updated chest x-ray to follow-up very close, approximately a week.  [facility] oxygen necessity form filled out 04/21/2023 and placed in my box.   For insurance purposes he will benefit from oxygen therapy, he is saturating 90% on 2 L nasal cannula at rest, room air was down to 81%.

## 2023-04-15 NOTE — Progress Notes (Addendum)
    Procedures performed today:    None.  Independent interpretation of notes and tests performed by another provider:   None.  Brief History, Exam, Impression, and Recommendations:    Bilateral pneumonia Pleasant 88 year old male, multiple medical problems, recent visit in the emergency department, hypoxic down to 81 at rest on room air. Ultimately CT scan she did show bibasilar pneumonia, treated with azithromycin, third-generation cephalosporin. Was discharged without oxygen, desaturated again at home on room air to 81% at rest. We did send him back to the ED, he did not ago, he was able to borrow an oxygen concentrator from a neighbor and he is satting approximately 90 to 91% here at rest on 2 L. His lungs are somewhat coarse. He is however speaking full sentence, no accessory muscle use, no nasal flaring. Vitals are stable. We will continue the course of treatment, he will need oxygen, I will try to set him up with a tank at 2 L and a concentrator for home use. We will do a shot of Rocephin, he is finished with his antibiotics from the hospital, 5 days of prednisone, I would like updated chest x-ray to follow-up very close, approximately a week.  [facility] oxygen necessity form filled out 04/21/2023 and placed in my box.   For insurance purposes he will benefit from oxygen therapy, he is saturating 90% on 2 L nasal cannula at rest, room air was down to 81%.    ____________________________________________ [name]. [name], M.D., ABFM., CAQSM., AME. Primary Care and Sports Medicine [facility]  Adjunct Professor of Family Medicine  [place] [employer] [facility] [employer]  Restaurant manager, fast food

## 2023-04-21 ENCOUNTER — Other Ambulatory Visit: Payer: Self-pay | Admitting: Sports Medicine

## 2023-04-21 DIAGNOSIS — M109 Gout, unspecified: Secondary | ICD-10-CM

## 2023-04-21 DIAGNOSIS — M1A0720 Idiopathic chronic gout, left ankle and foot, without tophus (tophi): Secondary | ICD-10-CM

## 2023-04-22 ENCOUNTER — Ambulatory Visit: Admitting: Sports Medicine

## 2023-04-22 ENCOUNTER — Encounter: Payer: Self-pay | Admitting: Sports Medicine

## 2023-04-22 VITALS — BP 125/75 | HR 97 | Temp 98.6°F | Ht 69.0 in | Wt 289.0 lb

## 2023-04-22 DIAGNOSIS — J189 Pneumonia, unspecified organism: Secondary | ICD-10-CM | POA: Diagnosis not present

## 2023-04-22 NOTE — Assessment & Plan Note (Addendum)
 Steven Patton returns, he is a pleasant 88 year old male, he did have an emergency department visit, hypoxia down to 81, diagnosed bilateral pneumonia. He was discharged ultimately on azithromycin and a third-generation cephalosporin. Ultimately he was given some oxygen, he does have the bottle at home, and borrowed an oxygen concentrator from a neighbor. Currently satting approximately 96% on 3 L here, we turned him down to 2 L and saturation remained good at about 93 to 95%. They did not get a portable oxygen concentrator, only a home concentrator and portable tanks so we will send another referral to [facility] for a portable oxygen concentrator.

## 2023-04-22 NOTE — Progress Notes (Addendum)
    Procedures performed today:    None.  Independent interpretation of notes and tests performed by another provider:   None.  Brief History, Exam, Impression, and Recommendations:    Bilateral pneumonia Steven Patton returns, he is a pleasant 88 year old male, he did have an emergency department visit, hypoxia down to 81, diagnosed bilateral pneumonia. He was discharged ultimately on azithromycin and a third-generation cephalosporin. Ultimately he was given some oxygen, he does have the bottle at home, and borrowed an oxygen concentrator from a neighbor. Currently satting approximately 96% on 3 L here, we turned him down to 2 L and saturation remained good at about 93 to 95%. They did not get a portable oxygen concentrator, only a home concentrator and portable tanks so we will send another referral to [facility] for a portable oxygen concentrator.    ____________________________________________ Steven Patton. Steven Patton, M.D., ABFM., CAQSM., AME. Primary Care and Sports Medicine [place] [facility]  Adjunct Professor of Family Medicine  [place] [employer] [facility] [employer] [employer]  Restaurant manager, fast food

## 2023-04-29 ENCOUNTER — Encounter: Payer: Self-pay | Admitting: Sports Medicine

## 2023-04-29 NOTE — Telephone Encounter (Signed)
 I think at this point just call "adult and pediatric specialists" to see what the hold-up is.

## 2023-05-20 ENCOUNTER — Ambulatory Visit (INDEPENDENT_AMBULATORY_CARE_PROVIDER_SITE_OTHER): Admitting: Sports Medicine

## 2023-05-20 ENCOUNTER — Encounter: Payer: Self-pay | Admitting: Sports Medicine

## 2023-05-20 VITALS — BP 148/83 | HR 86 | Resp 20 | Ht 69.0 in | Wt 287.0 lb

## 2023-05-20 DIAGNOSIS — E66813 Obesity, class 3: Secondary | ICD-10-CM | POA: Diagnosis not present

## 2023-05-20 DIAGNOSIS — J189 Pneumonia, unspecified organism: Secondary | ICD-10-CM

## 2023-05-20 MED ORDER — PHENTERMINE HCL 37.5 MG PO TABS: ORAL_TABLET | ORAL | 0 refills | Status: DC

## 2023-05-20 NOTE — Assessment & Plan Note (Addendum)
 Steven Patton has become quite sedentary, he has obesity, he did well with phentermine in the past. We will give him and his daughter a 3 stage regimen for fitness in the elderly. I am happy to start some phentermine, mostly to energize him. I cannot emphasize how important physical activity is to make he, as currently he just sits in the recliner all day. The goal of his return in 3 months will be for him to walk-in.

## 2023-05-20 NOTE — Assessment & Plan Note (Addendum)
 This is a very pleasant 88 year old male, he did have an emergency department visit due to hypoxia down to 81%, diagnosed with bilateral pneumonia, discharged on azithromycin and a third-generation cephalosporin, there was some difficulty initially getting oxygen, he did well using a neighbors concentrator as well as a bottle, typically satting at 91 to 93% on 2 L. He was able to get his own concentrator, set at 2 L she saturates at about 91%. We will continue this for about 3 months before a trial off of oxygen. His lungs do need time to recover from the bilateral pneumonia.

## 2023-05-20 NOTE — Progress Notes (Addendum)
    Procedures performed today:    None.  Independent interpretation of notes and tests performed by another provider:   None.  Brief History, Exam, Impression, and Recommendations:    Bilateral pneumonia This is a very pleasant 88 year old male, he did have an emergency department visit due to hypoxia down to 81%, diagnosed with bilateral pneumonia, discharged on azithromycin and a third-generation cephalosporin, there was some difficulty initially getting oxygen, he did well using a neighbors concentrator as well as a bottle, typically satting at 91 to 93% on 2 L. He was able to get his own concentrator, set at 2 L she saturates at about 91%. We will continue this for about 3 months before a trial off of oxygen. His lungs do need time to recover from the bilateral pneumonia.  Obesity Steven Patton has become quite sedentary, he has obesity, he did well with phentermine in the past. We will give him and his daughter a 3 stage regimen for fitness in the elderly. I am happy to start some phentermine, mostly to energize him. I cannot emphasize how important physical activity is to make he, as currently he just sits in the recliner all day. The goal of his return in 3 months will be for him to walk-in.    ____________________________________________ Joselyn Nicely. Sandy Crumb, M.D., ABFM., CAQSM., AME. Primary Care and Sports Medicine [place] [facility]  Adjunct Professor of [facility] Medicine  [employer]  Restaurant manager, fast food

## 2023-06-14 ENCOUNTER — Other Ambulatory Visit: Payer: Self-pay | Admitting: Sports Medicine

## 2023-06-14 DIAGNOSIS — R600 Localized edema: Secondary | ICD-10-CM

## 2023-06-14 DIAGNOSIS — R601 Generalized edema: Secondary | ICD-10-CM

## 2023-07-03 ENCOUNTER — Other Ambulatory Visit: Payer: Self-pay | Admitting: Sports Medicine

## 2023-07-03 DIAGNOSIS — M1A0720 Idiopathic chronic gout, left ankle and foot, without tophus (tophi): Secondary | ICD-10-CM

## 2023-07-03 DIAGNOSIS — M109 Gout, unspecified: Secondary | ICD-10-CM

## 2023-08-19 ENCOUNTER — Encounter: Payer: Self-pay | Admitting: Sports Medicine

## 2023-08-19 ENCOUNTER — Ambulatory Visit (INDEPENDENT_AMBULATORY_CARE_PROVIDER_SITE_OTHER): Admitting: Sports Medicine

## 2023-08-19 VITALS — BP 135/90 | HR 104 | Resp 20 | Ht 69.0 in | Wt 255.0 lb

## 2023-08-19 DIAGNOSIS — J189 Pneumonia, unspecified organism: Secondary | ICD-10-CM

## 2023-08-19 DIAGNOSIS — J449 Chronic obstructive pulmonary disease, unspecified: Secondary | ICD-10-CM | POA: Diagnosis not present

## 2023-08-19 DIAGNOSIS — E66813 Obesity, class 3: Secondary | ICD-10-CM

## 2023-08-19 DIAGNOSIS — M170 Bilateral primary osteoarthritis of knee: Secondary | ICD-10-CM | POA: Diagnosis not present

## 2023-08-19 DIAGNOSIS — Z23 Encounter for immunization: Secondary | ICD-10-CM | POA: Diagnosis not present

## 2023-08-19 DIAGNOSIS — M75102 Unspecified rotator cuff tear or rupture of left shoulder, not specified as traumatic: Secondary | ICD-10-CM | POA: Insufficient documentation

## 2023-08-19 DIAGNOSIS — M75122 Complete rotator cuff tear or rupture of left shoulder, not specified as traumatic: Secondary | ICD-10-CM

## 2023-08-19 MED ORDER — TRELEGY ELLIPTA 200-62.5-25 MCG/ACT IN AEPB: 1.0000 | INHALATION_SPRAY | Freq: Every day | RESPIRATORY_TRACT | 3 refills | Status: DC

## 2023-08-19 MED ORDER — PHENTERMINE HCL 37.5 MG PO TABS: 37.5000 mg | ORAL_TABLET | Freq: Every day | ORAL | 0 refills | Status: AC

## 2023-08-19 NOTE — Assessment & Plan Note (Signed)
 Pain with severe weakness to abduction left shoulder, suspect cuff tearing, adding home health PT.

## 2023-08-19 NOTE — Assessment & Plan Note (Signed)
 History of bilateral pneumonia. He has been on an oxygen concentrator, typically set at 2 L he will saturate 91 to 93%. We discussed a trial off of oxygen but they tell me when he is off of it he drops into the 80s at home. At this point I think we are likely dealing with some pulmonary fibrosis. We will add Trelegy, he will continue oxygen for now.

## 2023-08-19 NOTE — Assessment & Plan Note (Signed)
 30 pound weight loss but still not moving much around the house, he will continue his home pedal bike, he will also do some home health physical therapy.

## 2023-08-19 NOTE — Progress Notes (Signed)
    Procedures performed today:    None.  Independent interpretation of notes and tests performed by another provider:   None.  Brief History, Exam, Impression, and Recommendations:    Obesity Steven Patton is sedentary, he has obesity, he has been on phentermine  now for about 3 months and has lost over 30 pounds, we will refill and do another 3 months. He is quite deconditioned, I am going to send a physical therapist to his house to help him strengthen his legs and start to move again.  Knee osteoarthritis 30 pound weight loss but still not moving much around the house, he will continue his home pedal bike, he will also do some home health physical therapy.   Bilateral pneumonia History of bilateral pneumonia. He has been on an oxygen concentrator, typically set at 2 L he will saturate 91 to 93%. We discussed a trial off of oxygen but they tell me when he is off of it he drops into the 80s at home. At this point I think we are likely dealing with some pulmonary fibrosis. We will add Trelegy, he will continue oxygen for now.  Rotator cuff tear, left Pain with severe weakness to abduction left shoulder, suspect cuff tearing, adding home health PT.    ____________________________________________ [name], M.D., ABFM., CAQSM., AME. Primary Care and Sports Medicine [place] [facility]  Adjunct Professor of [facility] Medicine  [employer]  Restaurant manager, fast food

## 2023-08-19 NOTE — Assessment & Plan Note (Signed)
 Steven Patton is sedentary, he has obesity, he has been on phentermine  now for about 3 months and has lost over 30 pounds, we will refill and do another 3 months. He is quite deconditioned, I am going to send a physical therapist to his house to help him strengthen his legs and start to move again.

## 2023-08-24 ENCOUNTER — Encounter: Payer: Self-pay | Admitting: Sports Medicine

## 2023-08-24 DIAGNOSIS — J441 Chronic obstructive pulmonary disease with (acute) exacerbation: Secondary | ICD-10-CM

## 2023-08-24 MED ORDER — BREZTRI AEROSPHERE 160-9-4.8 MCG/ACT IN AERO: 2.0000 | INHALATION_SPRAY | Freq: Two times a day (BID) | RESPIRATORY_TRACT | 11 refills | Status: AC

## 2023-08-30 ENCOUNTER — Other Ambulatory Visit: Payer: Self-pay | Admitting: Sports Medicine

## 2023-09-01 ENCOUNTER — Other Ambulatory Visit: Payer: Self-pay | Admitting: Sports Medicine

## 2023-09-29 ENCOUNTER — Ambulatory Visit: Payer: Self-pay

## 2023-09-29 NOTE — Telephone Encounter (Signed)
 FYI Only or Action Required?: FYI only for provider.  Patient was last seen in primary care on 08/19/2023 by Curtis Debby PARAS, MD.  Called Nurse Triage reporting Arm Pain.  Symptoms began several months ago.  Interventions attempted: Rest, hydration, or home remedies and Ice/heat application.  Symptoms are: gradually worsening.  Triage Disposition: See PCP When Office is Open (Within 3 Days)  Patient/caregiver understands and will follow disposition?: Yes  Copied from CRM #[id number]. Topic: Clinical - Red Word Triage >> Sep 29, 2023  2:33 PM Graeme ORN wrote: Red Word that prompted transfer to Nurse Triage: Pain in arms preventing movement - hard to lift up - hard to get out of bed Reason for Disposition  [1] MODERATE pain (e.g., interferes with normal activities) AND [2] present > 3 days  Answer Assessment - Initial Assessment Questions 1. ONSET: When did the pain start?     Started 2 months ago, worsening over the past 2 weeks  2. LOCATION: Where is the pain located?     Upper arms, from  shoulder to  elbows. Left worse then the right  3. PAIN: How bad is the pain? (Scale 0-10; or none, mild, moderate, severe)     Moderate pain, when trying to move. No pain when sitting still  4. WORK OR EXERCISE: Has there been any recent work or exercise that involved this part of the body?     Was doing PT earlier this month, but finished 2 weeks ago  5. CAUSE: What do you think is causing the arm pain?     Unsure of cause, but had a fall 2 months ago.  diagnosed with possible torn rotator cuff and started on physicaal therapy. He has been sore the whole time but thought it to be due to therapy. Therapy has now been done for 2 weeks and per daughter, pain and weakness is persisting. 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 OTHER SYMPTOMS: Do you have any other symptoms? (e.g., neck pain, swelling, rash, fever, numbness,  weakness)     No swelling  Protocols used: Arm Pain-A-AH

## 2023-09-29 NOTE — Telephone Encounter (Signed)
 Patient scheduled 08/2/225 with Dr. alvan

## 2023-09-30 ENCOUNTER — Encounter: Payer: Self-pay | Admitting: Family Medicine

## 2023-09-30 ENCOUNTER — Ambulatory Visit (INDEPENDENT_AMBULATORY_CARE_PROVIDER_SITE_OTHER): Admitting: Family Medicine

## 2023-09-30 ENCOUNTER — Ambulatory Visit (INDEPENDENT_AMBULATORY_CARE_PROVIDER_SITE_OTHER)

## 2023-09-30 VITALS — BP 147/87 | HR 103

## 2023-09-30 DIAGNOSIS — M25511 Pain in right shoulder: Secondary | ICD-10-CM | POA: Diagnosis not present

## 2023-09-30 DIAGNOSIS — E669 Obesity, unspecified: Secondary | ICD-10-CM

## 2023-09-30 DIAGNOSIS — M25512 Pain in left shoulder: Secondary | ICD-10-CM

## 2023-09-30 DIAGNOSIS — R29898 Other symptoms and signs involving the musculoskeletal system: Secondary | ICD-10-CM

## 2023-09-30 DIAGNOSIS — M25612 Stiffness of left shoulder, not elsewhere classified: Secondary | ICD-10-CM

## 2023-09-30 DIAGNOSIS — M25611 Stiffness of right shoulder, not elsewhere classified: Secondary | ICD-10-CM

## 2023-09-30 DIAGNOSIS — E1169 Type 2 diabetes mellitus with other specified complication: Secondary | ICD-10-CM | POA: Diagnosis not present

## 2023-09-30 DIAGNOSIS — I10 Essential (primary) hypertension: Secondary | ICD-10-CM | POA: Diagnosis not present

## 2023-09-30 DIAGNOSIS — Z7984 Long term (current) use of oral hypoglycemic drugs: Secondary | ICD-10-CM

## 2023-09-30 DIAGNOSIS — M5412 Radiculopathy, cervical region: Secondary | ICD-10-CM

## 2023-09-30 NOTE — Progress Notes (Signed)
 Acute Office Visit  Subjective:     Patient ID: Steven Patton, male    DOB: 1935-05-18, 88 y.o.   MRN: [id number]  Chief Complaint  Patient presents with   Arm Pain    HPI Patient is in today for   Discussed the use of AI scribe software for clinical note transcription with the patient, who gave verbal consent to proceed.  History of Present Illness Steven Patton is an 88 year old male with chronic lymphocytic leukemia who presents with bilateral shoulder pain and weakness.  Bilateral shoulder pain and weakness - Bilateral shoulder pain and weakness present for several months - Left shoulder symptomatic for a couple of months; right shoulder pain began approximately two weeks ago - Pain described as muscular soreness, worsened by pulling or raising arms - Pain exacerbated by movement, alleviated by rest - Difficulty with activities of daily living, including reaching above head and turning off a lamp - No recent physical injuries to shoulders - He reported no prior history of shoulder issues but he actually had seen his PCP in July about 6 weeks ago and they felt like he probably had a left rotator cuff tear at the time and had recommended home PT.  Fatigue and generalized weakness - Significant fatigue and weakness impacting ability to transfer and perform daily activities  Neurological symptoms - History of spinal surgery with vertebral fusion - Bending head to the left causes tingling from neck to shoulder, not extending past the shoulder - No prior similar symptoms of numbness or tingling  Chronic lymphocytic leukemia - Diagnosed approximately six to seven years ago - Slow-growing disease, has not required treatment - No fevers or unusual nocturnal symptoms  Physical therapy - No participation in physical therapy for the past two weeks - Left shoulder pain was present prior to starting physical therapy  He has a history of anterior cervical discectomy and fusion.   He is also had multiple back surgeries hip arthroplasty and knee arthroplasty.  ROS      Objective:    BP (!) 147/87   Pulse (!) 103   SpO2 100% Comment: 1L   Physical Exam Vitals reviewed.  Constitutional:      Appearance: Normal appearance.  HENT:     Head: Normocephalic.  Pulmonary:     Effort: Pulmonary effort is normal.  Musculoskeletal:     Comments: He is only able to extend both shoulders to about 45 degrees with extension.  When he lifts his shoulders forward he is able to get the right one to about 90 degrees but the left is still around 45 degrees he is unable to touch his low back with either hand.  He is able to crossover and touch each shoulder and slightly lift his elbows.  Neck with pain and numbness if he tilts to the left.  But otherwise symmetric range of motion.  Neurological:     Mental Status: He is alert and oriented to person, place, and time.  Psychiatric:        Mood and Affect: Mood normal.        Behavior: Behavior normal.     Physical Exam MUSCULOSKELETAL: Right shoulder range of motion 90 degrees, left shoulder 45 degrees.    No results found for any visits on 09/30/23.      Assessment & Plan:   Problem List Items Addressed This Visit       Cardiovascular and Mediastinum   Hypertension   Relevant Orders  CBC with Differential   HgB A1c   CK (Creatine Kinase)   TSH   Sedimentation rate   C-reactive protein   DG Shoulder Left   DG Shoulder Right   Protein electrophoresis, serum     Endocrine   Type 2 diabetes mellitus with obesity (HCC) - Primary   Relevant Orders   CBC with Differential   HgB A1c   CK (Creatine Kinase)   TSH   Sedimentation rate   C-reactive protein   DG Shoulder Left   DG Shoulder Right   Protein electrophoresis, serum   Other Visit Diagnoses       Acute pain of both shoulders       Relevant Orders   CBC with Differential   HgB A1c   CK (Creatine Kinase)   TSH   Sedimentation rate    C-reactive protein   DG Shoulder Left   DG Shoulder Right   Protein electrophoresis, serum     Weakness of both shoulders       Relevant Orders   CBC with Differential   HgB A1c   CK (Creatine Kinase)   TSH   Sedimentation rate   C-reactive protein   DG Shoulder Left   DG Shoulder Right   Protein electrophoresis, serum     Left cervical radiculopathy       Relevant Orders   CBC with Differential   HgB A1c   CK (Creatine Kinase)   TSH   Sedimentation rate   C-reactive protein   DG Shoulder Left   DG Shoulder Right   Protein electrophoresis, serum       Assessment and Plan Assessment & Plan Bilateral shoulder pain and weakness Chronic bilateral shoulder pain and weakness, more pronounced on the left. Differential includes PMR, arthritis, muscle breakdown, and CLL progression, thyroid dysfunction. PMR suspected due to age and symptoms, with potential rapid improvement on prednisone  if confirmed. - Order CBC to evaluate blood cell lines and check for CLL progression. - Order inflammatory markers to assess for polymyalgia rheumatica.  Will treat with steroids if needed. - Order thyroid function tests to rule out thyroid-related weakness. - Order CK to check for muscle breakdown. - Order bilateral shoulder x-rays to assess for structural issues or arthritis. - Initiate prednisone  if inflammatory markers are elevated.  Chronic lymphocytic leukemia, not actively treated CLL diagnosed 6-7 years ago, previously slow-growing and not treated. Current symptoms suggest possible progression. - Monitor CLL status through CBC and assess for changes in blood cell lines.    No orders of the defined types were placed in this encounter.   Return in about 2 weeks (around 10/14/2023) for BP check with nurse.  Dorothyann Byars, MD

## 2023-10-01 ENCOUNTER — Ambulatory Visit: Payer: Self-pay | Admitting: Family Medicine

## 2023-10-01 DIAGNOSIS — M25511 Pain in right shoulder: Secondary | ICD-10-CM

## 2023-10-01 DIAGNOSIS — R779 Abnormality of plasma protein, unspecified: Secondary | ICD-10-CM

## 2023-10-01 DIAGNOSIS — R29898 Other symptoms and signs involving the musculoskeletal system: Secondary | ICD-10-CM

## 2023-10-01 MED ORDER — PREDNISONE 20 MG PO TABS: 40.0000 mg | ORAL_TABLET | Freq: Every day | ORAL | 0 refills | Status: DC

## 2023-10-01 NOTE — Progress Notes (Signed)
 Hi Steven Patton, your hemoglobin looks good.  Your A1c is 6.0 still in the prediabetes range so make sure that you are not getting into too many sweets or carbohydrate foods.  Muscle enzyme level looks okay send no sign of excess breakdown of muscle.  Thyroid level looks perfect.  Inflammatory markers look normal.  Awaiting the protein evaluation.  Has not come back yet on that part.  I think next steps would be to get you in with an orthopedist.  It does not look like you have the polymyalgia condition that I was evaluating for since those labs came back okay.  But I think seeing an orthopedist would be helpful especially since this is starting to really limit your ability to transfer and be more mobile.  I am going to send a round of prednisone  over for a few days through the weekend just to see if you feel like that is helpful.  That us  know if you have a preference for provider or location in regards to the orthopedist.  There is a group in [place] if you would like to stay more local.

## 2023-10-05 ENCOUNTER — Other Ambulatory Visit: Payer: Self-pay | Admitting: Family Medicine

## 2023-10-05 ENCOUNTER — Encounter: Payer: Self-pay | Admitting: Sports Medicine

## 2023-10-05 DIAGNOSIS — R29898 Other symptoms and signs involving the musculoskeletal system: Secondary | ICD-10-CM

## 2023-10-05 DIAGNOSIS — M25511 Pain in right shoulder: Secondary | ICD-10-CM

## 2023-10-05 LAB — TSH: TSH: 2.55 u[IU]/mL (ref 0.450–4.500)

## 2023-10-05 LAB — CBC WITH DIFFERENTIAL/PLATELET
Basophils Absolute: 0.1 x10E3/uL (ref 0.0–0.2)
Basos: 1 %
EOS (ABSOLUTE): 0.2 x10E3/uL (ref 0.0–0.4)
Eos: 2 %
Hematocrit: 52.4 % — ABNORMAL HIGH (ref 37.5–51.0)
Hemoglobin: 16.7 g/dL (ref 13.0–17.7)
Immature Grans (Abs): 0 x10E3/uL (ref 0.0–0.1)
Immature Granulocytes: 0 %
Lymphocytes Absolute: 3 x10E3/uL (ref 0.7–3.1)
Lymphs: 25 %
MCH: 32.1 pg (ref 26.6–33.0)
MCHC: 31.9 g/dL (ref 31.5–35.7)
MCV: 101 fL — ABNORMAL HIGH (ref 79–97)
Monocytes Absolute: 1.1 x10E3/uL — ABNORMAL HIGH (ref 0.1–0.9)
Monocytes: 9 %
Neutrophils Absolute: 7.3 x10E3/uL — ABNORMAL HIGH (ref 1.4–7.0)
Neutrophils: 63 %
Platelets: 152 x10E3/uL (ref 150–450)
RBC: 5.2 x10E6/uL (ref 4.14–5.80)
RDW: 14 % (ref 11.6–15.4)
WBC: 11.6 x10E3/uL — ABNORMAL HIGH (ref 3.4–10.8)

## 2023-10-05 LAB — PROTEIN ELECTROPHORESIS, SERUM
A/G Ratio: 1.3 (ref 0.7–1.7)
Albumin ELP: 3.8 g/dL (ref 2.9–4.4)
Alpha 1: 0.3 g/dL (ref 0.0–0.4)
Alpha 2: 1 g/dL (ref 0.4–1.0)
Beta: 1 g/dL (ref 0.7–1.3)
Gamma Globulin: 0.7 g/dL (ref 0.4–1.8)
Globulin, Total: 3 g/dL (ref 2.2–3.9)
M-Spike, %: 0.2 g/dL — ABNORMAL HIGH
Total Protein: 6.8 g/dL (ref 6.0–8.5)

## 2023-10-05 LAB — C-REACTIVE PROTEIN: CRP: 6 mg/L (ref 0–10)

## 2023-10-05 LAB — SEDIMENTATION RATE: Sed Rate: 26 mm/h (ref 0–30)

## 2023-10-05 LAB — HEMOGLOBIN A1C
Est. average glucose Bld gHb Est-mCnc: 126 mg/dL
Hgb A1c MFr Bld: 6 % — ABNORMAL HIGH (ref 4.8–5.6)

## 2023-10-05 LAB — CK: Total CK: 54 U/L (ref 30–208)

## 2023-10-05 NOTE — Progress Notes (Signed)
Order placed for ortho 

## 2023-10-06 NOTE — Progress Notes (Signed)
 Protien level was slightly elevated so need to draw additional labs.  Can he come by next week for labs?

## 2023-10-12 NOTE — Progress Notes (Signed)
 Hi Steven Patton, we finally got the reads back on the x-rays.  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  You have some narrowing or thinning of the [facility] joint particularly in the left shoulder which can suggest a chronic longstanding tear of the rotator cuff.  No sign of acute fractures.  Next up would be to get you in with the orthopedist or another sports medicine doc.  Do have a preference for provider or location?  So if you do not mind letting us  know where you go for your eye exam so that we can get the most up-to-date report on file it looks like the last one we have was from 2022.

## 2023-10-14 ENCOUNTER — Ambulatory Visit (INDEPENDENT_AMBULATORY_CARE_PROVIDER_SITE_OTHER)

## 2023-10-14 VITALS — BP 122/80 | HR 105

## 2023-10-14 DIAGNOSIS — I10 Essential (primary) hypertension: Secondary | ICD-10-CM

## 2023-10-14 NOTE — Patient Instructions (Signed)
 Pt is to return to clinic in 3 months for a fup with pcp

## 2023-10-14 NOTE — Progress Notes (Signed)
 Pt presents in office for blood pressure check last visit pt had  an elevated b/p reading of 147/87, he was placed on spironolactone   for his blood pressure a the time of his visit ,pt returns to clinic to check on his pressures  and get labs pt first b/p  was 127/89 after sitting for a few minutes  his second b/p was 122/80.

## 2023-10-15 ENCOUNTER — Ambulatory Visit: Payer: Self-pay | Admitting: Family Medicine

## 2023-10-15 LAB — BMP8+EGFR
BUN/Creatinine Ratio: 11 (ref 10–24)
BUN: 18 mg/dL (ref 8–27)
CO2: 24 mmol/L (ref 20–29)
Calcium: 9.7 mg/dL (ref 8.6–10.2)
Chloride: 98 mmol/L (ref 96–106)
Creatinine, Ser: 1.63 mg/dL — ABNORMAL HIGH (ref 0.76–1.27)
Glucose: 116 mg/dL — ABNORMAL HIGH (ref 70–99)
Potassium: 4.4 mmol/L (ref 3.5–5.2)
Sodium: 139 mmol/L (ref 134–144)
eGFR: 41 mL/min/1.73 — ABNORMAL LOW (ref >59)

## 2023-10-15 NOTE — Progress Notes (Signed)
Electrolytes look good.

## 2023-10-19 LAB — PE AND FLC, SERUM
A/G Ratio: 1.4 (ref 0.7–1.7)
Albumin ELP: 3.7 g/dL (ref 2.9–4.4)
Alpha 1: 0.3 g/dL (ref 0.0–0.4)
Alpha 2: 0.9 g/dL (ref 0.4–1.0)
Beta: 1 g/dL (ref 0.7–1.3)
Gamma Globulin: 0.5 g/dL (ref 0.4–1.8)
Globulin, Total: 2.6 g/dL (ref 2.2–3.9)
Ig Kappa Free Light Chain: 16.9 mg/L (ref 3.3–19.4)
Ig Lambda Free Light Chain: 14.2 mg/L (ref 5.7–26.3)
KAPPA/LAMBDA RATIO: 1.19 (ref 0.26–1.65)
M-Spike, %: 0.2 g/dL — ABNORMAL HIGH
Total Protein: 6.3 g/dL (ref 6.0–8.5)

## 2023-10-19 LAB — IMMUNOFIXATION, SERUM
IgA/Immunoglobulin A, Serum: 136 mg/dL (ref 61–437)
IgG (Immunoglobin G), Serum: 527 mg/dL — ABNORMAL LOW (ref 603–1613)
IgM (Immunoglobulin M), Srm: 66 mg/dL (ref 15–143)

## 2023-10-20 NOTE — Progress Notes (Signed)
 Hi Steven Patton, On the new blood test they did see a antibody protein called an IgM.  Suspect this is probably related to a possible condition that we called monoclonal gammopathy of undetermined significance.  It is usually something that is just monitored over time.  But I would like to get a blood specialist involved just to make sure since this is out of my normal treatment realm.  I just want to make sure that we are not doing the right thing.  If you are okay with that please let me know.  We do have a hematologist available at our [facility] location if that is convenient for you.  I think you might live a little closer to [place] so I can always schedule you with [facility] as well.

## 2023-10-28 NOTE — Addendum Note (Signed)
 Addended by: Jocelyn Nold P on: 10/28/2023 05:36 PM   Modules accepted: Orders

## 2023-10-29 NOTE — Addendum Note (Signed)
 Addended by: Matylda Fehring D on: 10/29/2023 02:17 PM   Modules accepted: Orders

## 2023-10-29 NOTE — Telephone Encounter (Signed)
 Orders Placed This Encounter  Procedures   PE and FLC, Serum   Immunofixation (IFE), Serum   AMB referral to orthopedics    Referral Priority:   Routine    Referral Type:   Consultation    Referred to Provider:   Beuford Anes, MD    Number of Visits Requested:   1   Ambulatory referral to Hematology / Oncology    Referral Priority:   Routine    Referral Type:   Consultation    Referral Reason:   Specialty Services Required    Requested Specialty:   Oncology    Number of Visits Requested:   1

## 2023-11-11 ENCOUNTER — Other Ambulatory Visit: Payer: Self-pay | Admitting: *Deleted

## 2023-11-11 DIAGNOSIS — I10 Essential (primary) hypertension: Secondary | ICD-10-CM

## 2023-11-11 MED ORDER — ATORVASTATIN CALCIUM 40 MG PO TABS: 40.0000 mg | ORAL_TABLET | Freq: Every day | ORAL | 3 refills | Status: AC

## 2023-11-19 ENCOUNTER — Encounter: Payer: Self-pay | Admitting: Hematology & Oncology

## 2023-11-19 ENCOUNTER — Inpatient Hospital Stay: Admitting: Hematology & Oncology

## 2023-11-19 ENCOUNTER — Inpatient Hospital Stay: Attending: Hematology & Oncology

## 2023-11-19 ENCOUNTER — Ambulatory Visit: Admitting: Sports Medicine

## 2023-11-19 VITALS — BP 130/71 | HR 114 | Temp 98.4°F | Resp 24

## 2023-11-19 DIAGNOSIS — D72820 Lymphocytosis (symptomatic): Secondary | ICD-10-CM

## 2023-11-19 DIAGNOSIS — D472 Monoclonal gammopathy: Secondary | ICD-10-CM | POA: Diagnosis present

## 2023-11-19 LAB — CBC WITH DIFFERENTIAL (CANCER CENTER ONLY)
Abs Immature Granulocytes: 0.09 K/uL — ABNORMAL HIGH (ref 0.00–0.07)
Basophils Absolute: 0.1 K/uL (ref 0.0–0.1)
Basophils Relative: 1 %
Eosinophils Absolute: 0.1 K/uL (ref 0.0–0.5)
Eosinophils Relative: 1 %
HCT: 48.9 % (ref 39.0–52.0)
Hemoglobin: 16.1 g/dL (ref 13.0–17.0)
Immature Granulocytes: 1 %
Lymphocytes Relative: 20 %
Lymphs Abs: 2.2 K/uL (ref 0.7–4.0)
MCH: 32.5 pg (ref 26.0–34.0)
MCHC: 32.9 g/dL (ref 30.0–36.0)
MCV: 98.8 fL (ref 80.0–100.0)
Monocytes Absolute: 0.9 K/uL (ref 0.1–1.0)
Monocytes Relative: 8 %
Neutro Abs: 7.9 K/uL — ABNORMAL HIGH (ref 1.7–7.7)
Neutrophils Relative %: 69 %
Platelet Count: 122 K/uL — ABNORMAL LOW (ref 150–400)
RBC: 4.95 MIL/uL (ref 4.22–5.81)
RDW: 15.1 % (ref 11.5–15.5)
WBC Count: 11.2 K/uL — ABNORMAL HIGH (ref 4.0–10.5)
nRBC: 0 % (ref 0.0–0.2)

## 2023-11-19 LAB — CMP (CANCER CENTER ONLY)
ALT: 19 U/L (ref 0–44)
AST: 31 U/L (ref 15–41)
Albumin: 4.3 g/dL (ref 3.5–5.0)
Alkaline Phosphatase: 54 U/L (ref 38–126)
Anion gap: 12 (ref 5–15)
BUN: 23 mg/dL (ref 8–23)
CO2: 26 mmol/L (ref 22–32)
Calcium: 9.8 mg/dL (ref 8.9–10.3)
Chloride: 100 mmol/L (ref 98–111)
Creatinine: 1.52 mg/dL — ABNORMAL HIGH (ref 0.61–1.24)
GFR, Estimated: 44 mL/min — ABNORMAL LOW (ref >60)
Glucose, Bld: 183 mg/dL — ABNORMAL HIGH (ref 70–99)
Potassium: 5.4 mmol/L — ABNORMAL HIGH (ref 3.5–5.1)
Sodium: 138 mmol/L (ref 135–145)
Total Bilirubin: 0.5 mg/dL (ref 0.0–1.2)
Total Protein: 6.8 g/dL (ref 6.5–8.1)

## 2023-11-19 LAB — LACTATE DEHYDROGENASE: LDH: 332 U/L — ABNORMAL HIGH (ref 98–192)

## 2023-11-19 LAB — SAVE SMEAR(SSMR), FOR PROVIDER SLIDE REVIEW

## 2023-11-19 NOTE — Progress Notes (Signed)
 Hematology and Oncology Follow Up Visit  Steven Patton [id number] 03/22/1935 88 y.o. 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 Renz is comes back for a long awaited visit.  The last time he was seen here was back 4 years ago.  He saw Dr. Zhao.  At that time, it is felt that he had a precursor to CLL.  Since then, he has not been followed up.  He has had a lot of other issues.  Most of the issues are arthritic issues.  He has had a lot of problems with pain in his joints.  He saw his family doctor and she ordered an SPEP.  This was done on 10/14/2023.  This showed an M spike of 0.2 g/dL.  I saw that everything else looked okay.  He was then referred back to us  to see what might be going on.  He is having problems with his right leg.  He says his leg feels very heavy.  He has had back surgery.  Again, he is a lot of arthritic issues.  He cannot walk.  He comes in a Art gallery manager.  Thankfully, his daughter comes in with him.  He did serve in the [employer].  He served back between the [place] War in the [place] War.  He has had multiple surgeries.  I am sure he has rotator cuff issues.  I think we will going to have to do a bone marrow biopsy on him to have a definitive diagnosis.  I am also going to get a 24-hour urine on him.  Again he is on hydralazine .  This would certainly explain the monoclonal gammopathy.  He used to smoke.  He stopped over 30 years ago.  At the present time, I will say that his performance status is probably ECOG 2-3.  Medications:  Current Outpatient Medications:    allopurinol  (ZYLOPRIM ) 300 MG tablet, TAKE 1 TABLET TWICE DAILY, Disp: 180 tablet, Rfl: 3   AMBULATORY NON FORMULARY MEDICATION, Knee-high, firm compression, custom made, graduated compression stockings. Apply to lower extremities., Disp: 1 each, Rfl: prn   AMBULATORY NON  FORMULARY MEDICATION, Light weight wheel chair., Disp: 1 each, Rfl: 0   AMBULATORY NON FORMULARY MEDICATION, Continuous positive airway pressure (CPAP) machine set on AutoPAP (4-20 cmH2O), with humidifier and all supplemental supplies as needed., Disp: 1 each, Rfl: 0   atorvastatin  (LIPITOR) 40 MG tablet, Take 1 tablet (40 mg total) by mouth at bedtime., Disp: 90 tablet, Rfl: 3   blood glucose meter kit and supplies KIT, Dispense based on patient and insurance preference. Use up to four times daily as directed. (FOR ICD-9 250.00, 250.01)., Disp: 1 each, Rfl: 0   budesonide-glycopyrrolate -formoterol (BREZTRI  AEROSPHERE) 160-9-4.8 MCG/ACT AERO inhaler, Inhale 2 puffs into the lungs 2 (two) times daily., Disp: 1 g, Rfl: 11   gabapentin  (NEURONTIN ) 600 MG tablet, TAKE 1 TABLET THREE TIMES DAILY, Disp: 270 tablet, Rfl: 3   metFORMIN  (GLUCOPHAGE ) 500 MG tablet, TAKE 1 TABLET EVERY DAY WITH BREAKFAST, Disp: 90 tablet, Rfl: 3   nitroGLYCERIN  (NITROSTAT ) 0.4 MG SL tablet, Place 0.4 mg under the tongue every 5 (five) minutes as needed for chest pain., Disp: , Rfl:    phentermine  (ADIPEX-P ) 37.5 MG tablet, Take 1 tablet (37.5 mg total) by mouth daily before breakfast., Disp: 90 tablet, Rfl: 0   spironolactone  (ALDACTONE ) 25 MG tablet, TAKE 1 TABLET EVERY DAY,  Disp: 90 tablet, Rfl: 3   torsemide  (DEMADEX ) 10 MG tablet, TAKE 1 TABLET EVERY DAY, Disp: 90 tablet, Rfl: 3   tretinoin  (ALTRALIN) 0.05 % gel, Use 1 application and massage into the skin of the beard, but not into the beard hair nightly, Disp: 45 g, Rfl: 11  Allergies: No Known Allergies  Past Medical History, Surgical history, Social history, and Family History were reviewed and updated.  Review of Systems: Review of Systems  Constitutional:  Positive for fatigue.  HENT:   Positive for sore throat.   Eyes: Negative.   Respiratory:  Positive for shortness of breath.   Cardiovascular:  Positive for leg swelling.  Gastrointestinal:  Positive for  nausea.  Endocrine: Negative.   Musculoskeletal:  Positive for arthralgias, back pain, flank pain, gait problem, myalgias, neck pain and neck stiffness.  Skin: Negative.   Neurological:  Positive for gait problem and numbness.  Hematological: Negative.   Psychiatric/Behavioral: Negative.      Physical Exam:  oral temperature is 98.4 F (36.9 C). His blood pressure is 130/71 and his pulse is 114 (abnormal). His respiration is 24 (abnormal) and oxygen saturation is 94%.   Wt Readings from Last 3 Encounters:  08/19/23 255 lb (115.7 kg)  05/20/23 287 lb (130.2 kg)  04/22/23 289 lb (131.1 kg)    Physical Exam Vitals reviewed.  HENT:     Head: Normocephalic and atraumatic.  Eyes:     Pupils: Pupils are equal, round, and reactive to light.  Cardiovascular:     Rate and Rhythm: Normal rate and regular rhythm.     Heart sounds: Normal heart sounds.  Pulmonary:     Effort: Pulmonary effort is normal.     Breath sounds: Normal breath sounds.  Abdominal:     General: Bowel sounds are normal.     Palpations: Abdomen is soft.  Musculoskeletal:        General: No tenderness or deformity. Normal range of motion.     Cervical back: Normal range of motion.  Lymphadenopathy:     Cervical: No cervical adenopathy.  Skin:    General: Skin is warm and dry.     Findings: No erythema or rash.  Neurological:     Mental Status: He is alert and oriented to person, place, and time.  Psychiatric:        Behavior: Behavior normal.        Thought Content: Thought content normal.        Judgment: Judgment normal.      Lab Results  Component Value Date   WBC 11.2 (H) 11/19/2023   HGB 16.1 11/19/2023   HCT 48.9 11/19/2023   MCV 98.8 11/19/2023   PLT 122 (L) 11/19/2023     Chemistry      Component Value Date/Time   NA 138 11/19/2023 1338   NA 139 10/14/2023 1122   K 5.4 (H) 11/19/2023 1338   CL 100 11/19/2023 1338   CO2 26 11/19/2023 1338   BUN 23 11/19/2023 1338   BUN 18 10/14/2023  1122   CREATININE 1.52 (H) 11/19/2023 1338   CREATININE 1.34 (H) 12/09/2020 1359      Component Value Date/Time   CALCIUM  9.8 11/19/2023 1338   ALKPHOS 54 11/19/2023 1338   AST 31 11/19/2023 1338   ALT 19 11/19/2023 1338   BILITOT 0.5 11/19/2023 1338      Impression and Plan: Mr. Weisinger is a very nice 88 year old white male.  Again, he has a minimal MGUS.  I do believe this could be reactive.  However, I think we are going to be forced to do a bone marrow biopsy on him.  I am also going to a 24-hour urine on him.  Again I have a hard time believing that he is going to have anything that is construed as a bone marrow malignancy.  I see nothing that looks like myeloma.  I see nothing that looks like lymphoma.  He probably will also need to have a MRI of the lumbar spine.  I suspect that he probably has bad degenerative changes.  He may have spinal stenosis that could be causing the problems with the right leg.  I know he does see Orthopedic Surgery.  We will try to get things set up for him in the next several weeks.  We will plan to get him back to see us  probably around Thanksgiving.   Maude JONELLE Crease, MD 10/17/20253:21 PM

## 2023-11-20 LAB — IGG, IGA, IGM
IgA: 138 mg/dL (ref 61–437)
IgG (Immunoglobin G), Serum: 511 mg/dL — ABNORMAL LOW (ref 603–1613)
IgM (Immunoglobulin M), Srm: 68 mg/dL (ref 15–143)

## 2023-11-22 LAB — KAPPA/LAMBDA LIGHT CHAINS
Kappa free light chain: 17.8 mg/L (ref 3.3–19.4)
Kappa, lambda light chain ratio: 1.16 (ref 0.26–1.65)
Lambda free light chains: 15.3 mg/L (ref 5.7–26.3)

## 2023-11-24 LAB — PROTEIN ELECTROPHORESIS, SERUM, WITH REFLEX
A/G Ratio: 1.6 (ref 0.7–1.7)
Albumin ELP: 3.8 g/dL (ref 2.9–4.4)
Alpha-1-Globulin: 0.3 g/dL (ref 0.0–0.4)
Alpha-2-Globulin: 0.8 g/dL (ref 0.4–1.0)
Beta Globulin: 0.9 g/dL (ref 0.7–1.3)
Gamma Globulin: 0.4 g/dL (ref 0.4–1.8)
Globulin, Total: 2.4 g/dL (ref 2.2–3.9)
M-Spike, %: 0.1 g/dL — ABNORMAL HIGH
SPEP Interpretation: 0
Total Protein ELP: 6.2 g/dL (ref 6.0–8.5)

## 2023-11-24 LAB — IMMUNOFIXATION REFLEX, SERUM
IgA: 137 mg/dL (ref 61–437)
IgG (Immunoglobin G), Serum: 487 mg/dL — ABNORMAL LOW (ref 603–1613)
IgM (Immunoglobulin M), Srm: 66 mg/dL (ref 15–143)

## 2023-11-25 ENCOUNTER — Ambulatory Visit: Payer: Self-pay | Admitting: Hematology & Oncology

## 2023-12-06 ENCOUNTER — Ambulatory Visit (HOSPITAL_COMMUNITY)
Admission: RE | Admit: 2023-12-06 | Discharge: 2023-12-06 | Disposition: A | Discharge status: Home / Self Care | Source: Ambulatory Visit | Attending: Hematology & Oncology | Admitting: Hematology & Oncology

## 2023-12-06 DIAGNOSIS — M47816 Spondylosis without myelopathy or radiculopathy, lumbar region: Secondary | ICD-10-CM | POA: Diagnosis not present

## 2023-12-06 DIAGNOSIS — G9589 Other specified diseases of spinal cord: Secondary | ICD-10-CM

## 2023-12-06 DIAGNOSIS — D72820 Lymphocytosis (symptomatic): Secondary | ICD-10-CM | POA: Insufficient documentation

## 2023-12-06 DIAGNOSIS — M48061 Spinal stenosis, lumbar region without neurogenic claudication: Secondary | ICD-10-CM | POA: Diagnosis not present

## 2023-12-06 DIAGNOSIS — M5126 Other intervertebral disc displacement, lumbar region: Secondary | ICD-10-CM

## 2023-12-06 MED ORDER — GADOBUTROL 1 MMOL/ML IV SOLN
10.0000 mL | Freq: Once | INTRAVENOUS | Status: AC | PRN
  Administered 2023-12-06: 10 mL via INTRAVENOUS

## 2023-12-08 NOTE — Telephone Encounter (Signed)
 Advised via MyChart.

## 2023-12-08 NOTE — Telephone Encounter (Signed)
-----   Message from Maude JONELLE Crease sent at 12/08/2023 10:13 AM EST ----- Please call and let him know that the MRI shows he has severe spinal stenosis.  He really needs to be seen by spine surgeon.  I do not know if he has 1.  Thanks.  Jeralyn ----- Message ----- From: Rebecka, Rad Results In Sent: 12/08/2023   8:32 AM EST To: Maude JONELLE Crease, MD

## 2023-12-15 ENCOUNTER — Other Ambulatory Visit: Payer: Self-pay | Admitting: Orthopedic Surgery

## 2023-12-15 DIAGNOSIS — M5416 Radiculopathy, lumbar region: Secondary | ICD-10-CM

## 2023-12-17 ENCOUNTER — Other Ambulatory Visit: Payer: Self-pay

## 2023-12-17 DIAGNOSIS — Z01812 Encounter for preprocedural laboratory examination: Secondary | ICD-10-CM

## 2023-12-18 NOTE — H&P (Signed)
 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  He presents today for image guided bone marrow biopsy for further evaluation.   Patient is Full Code  Past Medical History:  Diagnosis Date   Arthritis    Cataract    Chronic kidney disease    Diabetes mellitus without complication (HCC)    Hypertension    takes Amlodipine ,Lisinopril ,and Metoprolol  daily   Lower extremity weakness    Bilateral   Macular degeneration, left eye    Sleep apnea    2008.SABRASABRAWEARS CPAP    Past Surgical History:  Procedure Laterality Date   ANTERIOR CERVICAL DECOMP/DISCECTOMY FUSION     BACK SURGERY     CARDIAC CATHETERIZATION  06/10/2012   COLONOSCOPY     EYE SURGERY     CATARACT RIGHT EYE   KNEE SURGERY     LAPAROSCOPY N/A 01/02/2021   Procedure: LAPAROSCOPIC WASHOUT OF ABDOMINAL ABSCESS AND REMOVAL OF RETAINED GALLSTONES;  Surgeon: Aron Shoulders, MD;  Location: [facility] OR;  Service: General;  Laterality: N/A;   LUMBAR LAMINECTOMY/DECOMPRESSION MICRODISCECTOMY N/A 11/02/2012   Procedure: LUMBAR LAMINECTOMY/DECOMPRESSION MICRODISCECTOMY 2 LEVEL;  Surgeon: Oneil Rodgers Priestly, MD;  Location: [facility] OR;  Service: Orthopedics;  Laterality: N/A;  Lumbar 4-5,lumbar 5-sacrum 1 decompression   LUMBAR LAMINECTOMY/DECOMPRESSION MICRODISCECTOMY N/A 09/08/2017   Procedure: THORACIC 11-12 DECOMPRESSION;  Surgeon: Priestly Oneil, MD;  Location: [facility] OR;  Service: Orthopedics;  Laterality: N/A;   TONSILLECTOMY     TOTAL HIP  ARTHROPLASTY Left 01/06/2013   Procedure: LEFT TOTAL HIP ARTHROPLASTY-Posterior approach;  Surgeon: Norleen LITTIE Gavel, MD;  Location: [facility] OR;  Service: Orthopedics;  Laterality: Left;   TOTAL HIP ARTHROPLASTY Right 04/21/2013   Procedure: TOTAL HIP ARTHROPLASTY ANTERIOR APPROACH;  Surgeon: Norleen LITTIE Gavel, MD;  Location: [facility] OR;  Service: Orthopedics;  Laterality: Right;   TOTAL KNEE ARTHROPLASTY Left 09/16/2012   Procedure: TOTAL KNEE ARTHROPLASTY;  Surgeon: Norleen LITTIE Gavel, MD;  Location: [facility] OR;  Service: Orthopedics;  Laterality: Left;    Allergies: Patient has no known allergies.  Medications: Prior to Admission medications   Medication Sig Start Date End Date Taking? Authorizing Provider  allopurinol  (ZYLOPRIM ) 300 MG tablet TAKE 1 TABLET TWICE DAILY 07/05/23   Curtis Debby PARAS, MD  AMBULATORY NON FORMULARY MEDICATION Knee-high, firm compression, custom made, graduated compression stockings. Apply to lower extremities. 10/26/16   Curtis Debby PARAS, MD  AMBULATORY NON FORMULARY MEDICATION Light weight wheel chair. 12/26/19   Curtis Debby PARAS, MD  AMBULATORY NON FORMULARY MEDICATION Continuous positive airway pressure (CPAP) machine set on AutoPAP (4-20 cmH2O), with humidifier and all supplemental supplies as needed. 07/17/22   Curtis Debby PARAS, MD  atorvastatin  (LIPITOR) 40 MG tablet Take 1 tablet (40 mg total) by mouth at bedtime. 11/11/23   Alvan Dorothyann BIRCH, MD  blood glucose meter kit and supplies KIT Dispense based on patient and insurance preference. Use up to four times daily as directed. (FOR ICD-9 250.00, 250.01). 12/14/17   Curtis Debby PARAS, MD  budesonide-glycopyrrolate -formoterol (BREZTRI  AEROSPHERE) 160-9-4.8 MCG/ACT AERO inhaler Inhale 2 puffs into the lungs 2 (two) times daily. 08/24/23   Curtis Debby PARAS, MD  gabapentin  (NEURONTIN ) 600 MG tablet TAKE 1 TABLET THREE TIMES DAILY 08/30/23   Curtis Debby PARAS, MD  metFORMIN  (GLUCOPHAGE ) 500 MG tablet  TAKE 1 TABLET EVERY DAY WITH BREAKFAST 01/25/23   Curtis Debby PARAS, MD  nitroGLYCERIN  (NITROSTAT ) 0.4 MG SL tablet Place 0.4 mg under the tongue every 5 (five) minutes as needed for chest pain.    [provider]  phentermine  (ADIPEX-P ) 37.5 MG tablet Take 1 tablet (37.5 mg total) by mouth daily before breakfast. 08/19/23   Curtis Debby PARAS, MD  spironolactone  (ALDACTONE ) 25 MG tablet TAKE 1 TABLET EVERY DAY 01/25/23   Curtis Debby PARAS, MD  torsemide  (DEMADEX ) 10 MG tablet TAKE 1 TABLET EVERY DAY 06/14/23   Curtis Debby PARAS, MD  tretinoin  (ALTRALIN) 0.05 % gel Use 1 application and massage into the skin of the beard, but not into the beard hair nightly 03/16/23   Curtis Debby PARAS, MD     Family History  Problem Relation Age of Onset   Cancer Daughter    Alzheimer's disease Mother    Lung cancer Father     Social History   Socioeconomic History   Marital status: Widowed    Spouse name: Not on file   Number of children: 5   Years of education: 10   Highest education level: 10th grade  Occupational History    Comment: Retired  Tobacco Use   Smoking status: Former    Current packs/day: 0.00    Average packs/day: 1 pack/day for 20.0 years (20.0 ttl pk-yrs)    Types: Cigarettes    Start date: 01/04/1973    Quit date: 01/04/1993    Years since quitting: 30.9   Smokeless tobacco: Former    Types: Chew    Quit date: 01/04/1993   Tobacco comments:    quit 85yrs ago  Vaping Use   Vaping status: Never Used  Substance and Sexual Activity   Alcohol use: No   Drug use: No   Sexual activity: Not Currently    Birth control/protection: None  Other Topics Concern   Not on file  Social History Narrative   Lives with his daughter. He has five children. He enjoys watching television.    Social Drivers of Corporate Investment Banker Strain: Low Risk  (11/25/2020)   Overall Financial Resource Strain (CARDIA)    Difficulty of Paying Living Expenses: Not  hard at all  Food Insecurity: No Food Insecurity (11/25/2020)   Hunger Vital Sign    Worried About Running Out of Food in the Last Year: Never true    Ran Out of Food in the Last Year: Never true  Transportation Needs: No Transportation Needs (11/25/2020)   PRAPARE - Administrator, Civil Service (Medical): No    Lack of Transportation (Non-Medical): No  Physical Activity: Inactive (11/25/2020)   Exercise Vital Sign    Days of Exercise per Week: 0 days    Minutes of Exercise per Session: 0 min  Stress: No Stress Concern Present (11/25/2020)   [employer] of Occupational Health - Occupational Stress Questionnaire    Feeling of Stress : Not at all  Social Connections: Socially Isolated (11/25/2020)   Social Connection and Isolation Panel    Frequency of Communication with Friends and Family: Once a week    Frequency of Social Gatherings with Friends and Family: More than three times a week    Attends Religious Services: Never    Database Administrator or Organizations: No  Attends Banker Meetings: Never    Marital Status: Widowed       Review of Systems denies fever,HA,CP,abd pain,N/V or bleeding; he does have some dyspnea, occ cough, LE weakness  Vital Signs: Vitals:   12/20/23 0720  BP: 114/84  Pulse: 92  Resp: 16  Temp: 97.7 F (36.5 C)      Advance Care Plan: no documents on file  Physical Exam: awake/alert; chest- sl dim BS bases; heart- RRR; abd-soft,+BS,NT; tr-1+ pretibial edema bilat  Imaging: MR Lumbar Spine W Wo Contrast Result Date: 12/08/2023 CLINICAL DATA:  Lumbar radiculopathy, right leg weakness and numbness EXAM: MRI LUMBAR SPINE WITHOUT AND WITH CONTRAST TECHNIQUE: Multiplanar and multiecho pulse sequences of the lumbar spine were obtained without and with intravenous contrast. CONTRAST:  10mL GADAVIST  GADOBUTROL  1 MMOL/ML IV SOLN COMPARISON:  None Available. FINDINGS: Bone marrow: No significant abnormality Conus and  cauda equina: There is severe myelomalacia at the T11-T12 level Paraspinal tissues: No significant abnormality L1-L2: There is moderate degenerative disc disease with a disc bulge. There is mild facet arthropathy with facet enlargement and mild spinal stenosis L2-L3: Mild degenerative disc disease with preservation of the disc height. Mild facet arthropathy. No significant spinal stenosis or foraminal stenosis L3-L4: Mild degenerative disc disease. There is severe facet arthropathy with facet enlargement and severe spinal stenosis. No significant foraminal stenosis L4-L5: Previous posterior decompression. There is right paracentral herniation of the disc with facet arthropathy. There is severe stenosis of the right lateral recess and severe right neural foraminal stenosis. There is moderate stenosis of the left neural foramen L5-S1: Previous posterior decompression. There is ankylosis of the vertebral bodies. No spinal stenosis. There is moderate bilateral neural foraminal stenosis. IMPRESSION: 1. Previous surgery at the thoracolumbar junction with severe myelomalacia of the cord at the T11-T12 level, unchanged 2. Severe spinal stenosis at L3-4 due to severe facet arthropathy, worse than on the prior study 3. Previous posterior decompression at L4-5. There is a right paracentral disc herniation with facet arthropathy with severe stenosis of the right lateral recess and right neural foramen. This is worse than on the prior study. There is a moderate left neural foraminal stenosis. Electronically Signed   By: Nancyann Burns M.D.   On: 12/08/2023 08:30    Labs:  CBC: Recent Labs    09/30/23 1201 11/19/23 1338  WBC 11.6* 11.2*  HGB 16.7 16.1  HCT 52.4* 48.9  PLT 152 122*    COAGS: No results for input(s): INR, APTT in the last 8760 hours.  BMP: Recent Labs    10/14/23 1122 11/19/23 1338  NA 139 138  K 4.4 5.4*  CL 98 100  CO2 24 26  GLUCOSE 116* 183*  BUN 18 23  CALCIUM  9.7 9.8   CREATININE 1.63* 1.52*  GFRNONAA  --  44*    LIVER FUNCTION TESTS: Recent Labs    09/30/23 1201 10/14/23 1046 11/19/23 1338  BILITOT  --   --  0.5  AST  --   --  31  ALT  --   --  19  ALKPHOS  --   --  54  PROT 6.8 6.3 6.8  ALBUMIN   --   --  4.3    TUMOR MARKERS: No results for input(s): AFPTM, CEA, CA199, CHROMGRNA in the last 8760 hours.  Assessment and Plan: 88 y.o. 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  He  presents today for image guided bone marrow biopsy for further evaluation.Risks and benefits of procedure was discussed with the patient/son including, but not limited to bleeding, infection, damage to adjacent structures or low yield requiring additional tests.  All of the questions were answered and there is agreement to proceed.  Consent signed and in chart.  Known to IR team from subhepatic abscess drainage in 2022  Thank you for allowing our service to participate in Steven Patton 's care.  Electronically Signed: D. Franky Rakers, PA-C   12/18/2023, 1:55 PM      I spent a total of  20 minutes   in face to face in clinical consultation, greater than 50% of which was counseling/coordinating care for image guided bone marrow biopsy

## 2023-12-20 ENCOUNTER — Ambulatory Visit (HOSPITAL_COMMUNITY)
Admission: RE | Admit: 2023-12-20 | Discharge: 2023-12-20 | Disposition: A | Source: Ambulatory Visit | Attending: Hematology & Oncology

## 2023-12-20 ENCOUNTER — Ambulatory Visit (HOSPITAL_COMMUNITY)
Admission: RE | Admit: 2023-12-20 | Discharge: 2023-12-20 | Disposition: A | Discharge status: Home / Self Care | Source: Ambulatory Visit | Attending: Hematology & Oncology | Admitting: Hematology & Oncology

## 2023-12-20 ENCOUNTER — Encounter (HOSPITAL_COMMUNITY): Payer: Self-pay

## 2023-12-20 DIAGNOSIS — Z01812 Encounter for preprocedural laboratory examination: Secondary | ICD-10-CM

## 2023-12-20 DIAGNOSIS — Z1379 Encounter for other screening for genetic and chromosomal anomalies: Secondary | ICD-10-CM | POA: Insufficient documentation

## 2023-12-20 DIAGNOSIS — E119 Type 2 diabetes mellitus without complications: Secondary | ICD-10-CM | POA: Diagnosis not present

## 2023-12-20 DIAGNOSIS — M4807 Spinal stenosis, lumbosacral region: Secondary | ICD-10-CM | POA: Insufficient documentation

## 2023-12-20 DIAGNOSIS — N189 Chronic kidney disease, unspecified: Secondary | ICD-10-CM | POA: Diagnosis not present

## 2023-12-20 DIAGNOSIS — G4730 Sleep apnea, unspecified: Secondary | ICD-10-CM | POA: Insufficient documentation

## 2023-12-20 DIAGNOSIS — Z87891 Personal history of nicotine dependence: Secondary | ICD-10-CM | POA: Insufficient documentation

## 2023-12-20 DIAGNOSIS — I130 Hypertensive heart and chronic kidney disease with heart failure and stage 1 through stage 4 chronic kidney disease, or unspecified chronic kidney disease: Secondary | ICD-10-CM | POA: Insufficient documentation

## 2023-12-20 DIAGNOSIS — D72820 Lymphocytosis (symptomatic): Secondary | ICD-10-CM | POA: Insufficient documentation

## 2023-12-20 HISTORY — DX: Dyspnea, unspecified: R06.00

## 2023-12-20 LAB — GLUCOSE, CAPILLARY: Glucose-Capillary: 125 mg/dL — ABNORMAL HIGH (ref 70–99)

## 2023-12-20 LAB — CBC WITH DIFFERENTIAL/PLATELET
Abs Immature Granulocytes: 0.07 K/uL (ref 0.00–0.07)
Basophils Absolute: 0 K/uL (ref 0.0–0.1)
Basophils Relative: 0 %
Eosinophils Absolute: 0.2 K/uL (ref 0.0–0.5)
Eosinophils Relative: 2 %
HCT: 45.8 % (ref 39.0–52.0)
Hemoglobin: 14.7 g/dL (ref 13.0–17.0)
Immature Granulocytes: 1 %
Lymphocytes Relative: 27 %
Lymphs Abs: 3 K/uL (ref 0.7–4.0)
MCH: 32.7 pg (ref 26.0–34.0)
MCHC: 32.1 g/dL (ref 30.0–36.0)
MCV: 101.8 fL — ABNORMAL HIGH (ref 80.0–100.0)
Monocytes Absolute: 0.9 K/uL (ref 0.1–1.0)
Monocytes Relative: 8 %
Neutro Abs: 6.9 K/uL (ref 1.7–7.7)
Neutrophils Relative %: 62 %
Platelets: 90 K/uL — ABNORMAL LOW (ref 150–400)
RBC: 4.5 MIL/uL (ref 4.22–5.81)
RDW: 15.9 % — ABNORMAL HIGH (ref 11.5–15.5)
Smear Review: NORMAL
WBC: 11.1 K/uL — ABNORMAL HIGH (ref 4.0–10.5)
nRBC: 0 % (ref 0.0–0.2)

## 2023-12-20 LAB — PROTIME-INR
INR: 0.9 (ref 0.8–1.2)
Prothrombin Time: 12.7 s (ref 11.4–15.2)

## 2023-12-20 MED ORDER — MIDAZOLAM HCL 2 MG/2ML IJ SOLN
INTRAMUSCULAR | Status: AC
  Filled 2023-12-20: qty 2

## 2023-12-20 MED ORDER — FENTANYL CITRATE (PF) 100 MCG/2ML IJ SOLN
INTRAMUSCULAR | Status: AC
  Filled 2023-12-20: qty 2

## 2023-12-20 MED ORDER — FENTANYL CITRATE (PF) 100 MCG/2ML IJ SOLN
INTRAMUSCULAR | Status: AC | PRN
  Administered 2023-12-20 (×2): 25 ug via INTRAVENOUS

## 2023-12-20 MED ORDER — MIDAZOLAM HCL (PF) 2 MG/2ML IJ SOLN
INTRAMUSCULAR | Status: AC | PRN
Start: 2023-12-20 — End: 2023-12-20
  Administered 2023-12-20: 1 mg via INTRAVENOUS

## 2023-12-20 MED ORDER — SODIUM CHLORIDE 0.9 % IV SOLN: INTRAVENOUS | Status: DC

## 2023-12-20 NOTE — Procedures (Signed)
 Vascular and Interventional Radiology Procedure Note  Patient: Steven Patton DOB: 1935/09/01 Medical Record Number: [id number] Note Date/Time: 12/20/23 8:59 AM   Performing Physician: Thom Hall, MD Assistant(s): None  Diagnosis: Q MGUS   Procedure: BONE MARROW ASPIRATION and BIOPSY  Anesthesia: Conscious Sedation Complications: None Estimated Blood Loss: Minimal Specimens: Sent for Pathology  Findings:  Successful CT-guided bone marrow aspiration and biopsy A total of 1 cores were obtained. Hemostasis of the tract was achieved using Manual Pressure.  Plan: Bed rest for 1 hours.  See detailed procedure note with images in PACS. The patient tolerated the procedure well without incident or complication and was returned to Recovery in stable condition.    Thom Hall, MD V[facility] Interventional Radiology [facility] Radiology   Pager. [phone] Clinic. [phone]

## 2023-12-22 ENCOUNTER — Telehealth (HOSPITAL_COMMUNITY): Payer: Self-pay | Admitting: Radiology

## 2023-12-22 NOTE — Telephone Encounter (Signed)
 Called pt to schedule ESI with Dr. Johann on 11/24 at 2 pm. Left VM for him to call back. JM

## 2023-12-23 LAB — SURGICAL PATHOLOGY

## 2023-12-28 ENCOUNTER — Encounter (HOSPITAL_COMMUNITY): Payer: Self-pay

## 2024-01-10 ENCOUNTER — Inpatient Hospital Stay: Admitting: Hematology & Oncology

## 2024-01-10 ENCOUNTER — Inpatient Hospital Stay: Attending: Hematology & Oncology

## 2024-01-13 ENCOUNTER — Telehealth (HOSPITAL_COMMUNITY): Payer: Self-pay | Admitting: Radiology

## 2024-01-13 NOTE — Telephone Encounter (Signed)
 Called pt's daughter to schedule pt for ESI. No answer and VM was full. JM

## 2024-01-21 ENCOUNTER — Ambulatory Visit (HOSPITAL_COMMUNITY)
Admission: RE | Admit: 2024-01-21 | Discharge: 2024-01-21 | Disposition: A | Discharge status: Home / Self Care | Source: Ambulatory Visit | Attending: Orthopedic Surgery | Admitting: Orthopedic Surgery

## 2024-01-21 ENCOUNTER — Telehealth: Payer: Self-pay | Admitting: Hematology & Oncology

## 2024-01-21 ENCOUNTER — Encounter: Payer: Self-pay | Admitting: *Deleted

## 2024-01-21 DIAGNOSIS — M5416 Radiculopathy, lumbar region: Secondary | ICD-10-CM | POA: Insufficient documentation

## 2024-01-21 HISTORY — PX: IR EPIDUROGRAPHY: IMG2365

## 2024-01-21 MED ORDER — LIDOCAINE HCL (PF) 1 % IJ SOLN
30.0000 mL | Freq: Once | INTRAMUSCULAR | Status: AC
  Administered 2024-01-21: 10 mL via INTRADERMAL

## 2024-01-21 MED ORDER — METHYLPREDNISOLONE ACETATE 40 MG/ML IJ SUSP
INTRAMUSCULAR | Status: AC
  Filled 2024-01-21: qty 1

## 2024-01-21 MED ORDER — METHYLPREDNISOLONE ACETATE 80 MG/ML IJ SUSP
INTRAMUSCULAR | Status: AC
  Filled 2024-01-21: qty 1

## 2024-01-21 MED ORDER — METHYLPREDNISOLONE ACETATE 80 MG/ML IJ SUSP
80.0000 mg | Freq: Once | INTRAMUSCULAR | Status: AC
  Administered 2024-01-21: 80 mg via INTRA_ARTICULAR

## 2024-01-21 MED ORDER — IOHEXOL 180 MG/ML  SOLN
20.0000 mL | Freq: Once | INTRAMUSCULAR | Status: AC | PRN
  Administered 2024-01-21: 2 mL via EPIDURAL

## 2024-01-21 MED ORDER — IOHEXOL 180 MG/ML  SOLN
INTRAMUSCULAR | Status: AC
  Filled 2024-01-21: qty 10

## 2024-01-21 MED ORDER — LIDOCAINE HCL (PF) 1 % IJ SOLN
INTRAMUSCULAR | Status: AC
  Filled 2024-01-21: qty 30

## 2024-01-21 NOTE — Progress Notes (Signed)
 Patient BMbx completed in November however patient has no office follow up. Spoke to Dr Timmy and he is waiting for report on MYD88.  Called and spoke to [place] in pathology. Despite the BM report stating that MYD88 was pending, she states it was never ordered. Order was placed. Dr Timmy notified.   Oncology Nurse Navigator Documentation     01/21/2024    8:30 AM  Oncology Nurse Navigator Flowsheets  Navigator Location [facility]  Navigator Encounter Type Pathology Review  Barriers/Navigation Needs Coordination of Care  Interventions Coordination of Care  Coordination of Care Pathology  Time Spent with Patient 30

## 2024-01-21 NOTE — Procedures (Signed)
" °  Procedure:  R L4 and L5 selective nerve root block and transforaminal ESI 80mg  depomedrol Preprocedure diagnosis: The encounter diagnosis was Lumbar radiculopathy. Postprocedure diagnosis: same EBL:    minimal Complications:   none immediate  See full dictation in [employer].  CHARM Toribio Faes MD Main # [phone] Pager  [phone] Mobile [phone]    "

## 2024-01-21 NOTE — Telephone Encounter (Signed)
 Called to r/s missed appts/ LVM to return call for scheduling.

## 2024-01-24 ENCOUNTER — Telehealth: Payer: Self-pay | Admitting: Hematology & Oncology

## 2024-01-24 NOTE — Telephone Encounter (Signed)
 Called to r/s pt's missed appts. LVM to return call for scheduling.

## 2024-01-31 ENCOUNTER — Telehealth: Payer: Self-pay | Admitting: Hematology & Oncology

## 2024-01-31 NOTE — Telephone Encounter (Signed)
 Called to reschedule missed appts per inbasket. LVM to return call for scheduling.

## 2024-02-02 ENCOUNTER — Telehealth: Payer: Self-pay | Admitting: Hematology & Oncology

## 2024-02-02 NOTE — Telephone Encounter (Signed)
 Called to schedule follow up per inbasket. LVM to return call for scheduling.

## 2024-02-16 ENCOUNTER — Inpatient Hospital Stay: Attending: Hematology & Oncology

## 2024-02-16 ENCOUNTER — Inpatient Hospital Stay: Admitting: Hematology & Oncology

## 2024-02-16 ENCOUNTER — Other Ambulatory Visit: Payer: Self-pay

## 2024-02-16 ENCOUNTER — Encounter: Payer: Self-pay | Admitting: Hematology & Oncology

## 2024-02-16 ENCOUNTER — Encounter: Payer: Self-pay | Admitting: *Deleted

## 2024-02-16 VITALS — BP 130/84 | HR 95 | Temp 98.3°F | Resp 20

## 2024-02-16 DIAGNOSIS — D72820 Lymphocytosis (symptomatic): Secondary | ICD-10-CM

## 2024-02-16 DIAGNOSIS — N1831 Chronic kidney disease, stage 3a: Secondary | ICD-10-CM

## 2024-02-16 DIAGNOSIS — D472 Monoclonal gammopathy: Secondary | ICD-10-CM | POA: Diagnosis not present

## 2024-02-16 LAB — CBC WITH DIFFERENTIAL (CANCER CENTER ONLY)
Abs Immature Granulocytes: 0.06 K/uL (ref 0.00–0.07)
Basophils Absolute: 0.1 K/uL (ref 0.0–0.1)
Basophils Relative: 1 %
Eosinophils Absolute: 0.2 K/uL (ref 0.0–0.5)
Eosinophils Relative: 2 %
HCT: 48 % (ref 39.0–52.0)
Hemoglobin: 15.2 g/dL (ref 13.0–17.0)
Immature Granulocytes: 1 %
Lymphocytes Relative: 24 %
Lymphs Abs: 2.7 K/uL (ref 0.7–4.0)
MCH: 32.8 pg (ref 26.0–34.0)
MCHC: 31.7 g/dL (ref 30.0–36.0)
MCV: 103.4 fL — ABNORMAL HIGH (ref 80.0–100.0)
Monocytes Absolute: 1.1 K/uL — ABNORMAL HIGH (ref 0.1–1.0)
Monocytes Relative: 10 %
Neutro Abs: 7.1 K/uL (ref 1.7–7.7)
Neutrophils Relative %: 62 %
Platelet Count: 108 K/uL — ABNORMAL LOW (ref 150–400)
RBC: 4.64 MIL/uL (ref 4.22–5.81)
RDW: 15 % (ref 11.5–15.5)
WBC Count: 11.3 K/uL — ABNORMAL HIGH (ref 4.0–10.5)
nRBC: 0 % (ref 0.0–0.2)

## 2024-02-16 LAB — CMP (CANCER CENTER ONLY)
ALT: 13 U/L (ref 0–44)
AST: 21 U/L (ref 15–41)
Albumin: 4.4 g/dL (ref 3.5–5.0)
Alkaline Phosphatase: 49 U/L (ref 38–126)
Anion gap: 11 (ref 5–15)
BUN: 21 mg/dL (ref 8–23)
CO2: 30 mmol/L (ref 22–32)
Calcium: 9.7 mg/dL (ref 8.9–10.3)
Chloride: 101 mmol/L (ref 98–111)
Creatinine: 1.34 mg/dL — ABNORMAL HIGH (ref 0.61–1.24)
GFR, Estimated: 52 mL/min — ABNORMAL LOW
Glucose, Bld: 129 mg/dL — ABNORMAL HIGH (ref 70–99)
Potassium: 4.7 mmol/L (ref 3.5–5.1)
Sodium: 142 mmol/L (ref 135–145)
Total Bilirubin: 0.4 mg/dL (ref 0.0–1.2)
Total Protein: 6.6 g/dL (ref 6.5–8.1)

## 2024-02-16 LAB — VITAMIN B12: Vitamin B-12: 264 pg/mL (ref 180–914)

## 2024-02-16 LAB — LACTATE DEHYDROGENASE: LDH: 203 U/L (ref 105–235)

## 2024-02-16 NOTE — Progress Notes (Signed)
 " Hematology and Oncology Follow Up Visit  Jiaire Rosebrook [id number] 01-02-1936 89 y.o. 02/16/2024   Principle Diagnosis:  MGUS-0.2 g/dL of protein Monoclonal B-cell lymphocytosis  Current Therapy:   Observation     Interim History:  Mr. Edman is comes back for a follow-up.  We last saw him back on October.  We did do a bone marrow biopsy on him.  This was done on 12/20/2023.  The pathology report ([id number]) showed a normocellular bone marrow with orderly hematopoiesis.  There are 14% monoclonal B cells.  Given the fact that he does have a, IgM lambda monoclonal protein, this could certainly indicate that this might be a precursor to lymphoplasmacytic lymphoma.  A MYD88 test was sent off.  There is always for some reason still I would not back from what I can tell.  He comes in on his electric wheelchair.  He does has so many health issues.  He has a lot of arthritic issues.  His appetite is okay.  He is losing some weight.  He is on a diet pill.  He had a very quiet Christmas.  He has had no fever.  He has had no flu or COVID.  Para he has had no change in bowel or bladder habits.  There is been no cough.  Overall, I would say that his performance status is probably ECOG 2.  Medications:  Current Outpatient Medications:    allopurinol  (ZYLOPRIM ) 300 MG tablet, TAKE 1 TABLET TWICE DAILY, Disp: 180 tablet, Rfl: 3   AMBULATORY NON FORMULARY MEDICATION, Knee-high, firm compression, custom made, graduated compression stockings. Apply to lower extremities., Disp: 1 each, Rfl: prn   AMBULATORY NON FORMULARY MEDICATION, Light weight wheel chair., Disp: 1 each, Rfl: 0   AMBULATORY NON FORMULARY MEDICATION, Continuous positive airway pressure (CPAP) machine set on AutoPAP (4-20 cmH2O), with humidifier and all supplemental supplies as needed., Disp: 1 each, Rfl: 0   atorvastatin  (LIPITOR) 40 MG tablet, Take 1 tablet (40 mg total) by mouth at bedtime., Disp: 90 tablet, Rfl: 3   blood  glucose meter kit and supplies KIT, Dispense based on patient and insurance preference. Use up to four times daily as directed. (FOR ICD-9 250.00, 250.01)., Disp: 1 each, Rfl: 0   budesonide-glycopyrrolate -formoterol (BREZTRI  AEROSPHERE) 160-9-4.8 MCG/ACT AERO inhaler, Inhale 2 puffs into the lungs 2 (two) times daily., Disp: 1 g, Rfl: 11   gabapentin  (NEURONTIN ) 600 MG tablet, TAKE 1 TABLET THREE TIMES DAILY, Disp: 270 tablet, Rfl: 3   metFORMIN  (GLUCOPHAGE ) 500 MG tablet, TAKE 1 TABLET EVERY DAY WITH BREAKFAST, Disp: 90 tablet, Rfl: 3   nitroGLYCERIN  (NITROSTAT ) 0.4 MG SL tablet, Place 0.4 mg under the tongue every 5 (five) minutes as needed for chest pain., Disp: , Rfl:    phentermine  (ADIPEX-P ) 37.5 MG tablet, Take 1 tablet (37.5 mg total) by mouth daily before breakfast., Disp: 90 tablet, Rfl: 0   spironolactone  (ALDACTONE ) 25 MG tablet, TAKE 1 TABLET EVERY DAY, Disp: 90 tablet, Rfl: 3   torsemide  (DEMADEX ) 10 MG tablet, TAKE 1 TABLET EVERY DAY, Disp: 90 tablet, Rfl: 3   tretinoin  (ALTRALIN) 0.05 % gel, Use 1 application and massage into the skin of the beard, but not into the beard hair nightly, Disp: 45 g, Rfl: 11  Allergies: No Known Allergies  Past Medical History, Surgical history, Social history, and Family History were reviewed and updated.  Review of Systems: Review of Systems  Constitutional:  Positive for fatigue.  HENT:   Positive for sore throat.  Eyes: Negative.   Respiratory:  Positive for shortness of breath.   Cardiovascular:  Positive for leg swelling.  Gastrointestinal:  Positive for nausea.  Endocrine: Negative.   Musculoskeletal:  Positive for arthralgias, back pain, flank pain, gait problem, myalgias, neck pain and neck stiffness.  Skin: Negative.   Neurological:  Positive for gait problem and numbness.  Hematological: Negative.   Psychiatric/Behavioral: Negative.      Physical Exam: Vital signs show temperature 98.3.  Pulse 95.  Blood pressure 130/84.   Weight was not taken.   Wt Readings from Last 3 Encounters:  12/20/23 262 lb (118.8 kg)  08/19/23 255 lb (115.7 kg)  05/20/23 287 lb (130.2 kg)    Physical Exam Vitals reviewed.  HENT:     Head: Normocephalic and atraumatic.  Eyes:     Pupils: Pupils are equal, round, and reactive to light.  Cardiovascular:     Rate and Rhythm: Normal rate and regular rhythm.     Heart sounds: Normal heart sounds.  Pulmonary:     Effort: Pulmonary effort is normal.     Breath sounds: Normal breath sounds.  Abdominal:     General: Bowel sounds are normal.     Palpations: Abdomen is soft.  Musculoskeletal:        General: No tenderness or deformity. Normal range of motion.     Cervical back: Normal range of motion.  Lymphadenopathy:     Cervical: No cervical adenopathy.  Skin:    General: Skin is warm and dry.     Findings: No erythema or rash.  Neurological:     Mental Status: He is alert and oriented to person, place, and time.  Psychiatric:        Behavior: Behavior normal.        Thought Content: Thought content normal.        Judgment: Judgment normal.      Lab Results  Component Value Date   WBC 11.3 (H) 02/16/2024   HGB 15.2 02/16/2024   HCT 48.0 02/16/2024   MCV 103.4 (H) 02/16/2024   PLT 108 (L) 02/16/2024     Chemistry      Component Value Date/Time   NA 138 11/19/2023 1338   NA 139 10/14/2023 1122   K 5.4 (H) 11/19/2023 1338   CL 100 11/19/2023 1338   CO2 26 11/19/2023 1338   BUN 23 11/19/2023 1338   BUN 18 10/14/2023 1122   CREATININE 1.52 (H) 11/19/2023 1338   CREATININE 1.34 (H) 12/09/2020 1359      Component Value Date/Time   CALCIUM  9.8 11/19/2023 1338   ALKPHOS 54 11/19/2023 1338   AST 31 11/19/2023 1338   ALT 19 11/19/2023 1338   BILITOT 0.5 11/19/2023 1338      Impression and Plan: Mr. Fitzsimmons is a very nice 89 year old white male.  Again, he has a minimal MGUS.  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  Again, he may have a precursor to  [place]strm's macroglobulinemia.  I do think we are going to have to get a CT scan on him.  This may not be easy given his size.  Also, he is not all that mobile.  We will try to do a 24-hour urine on him.  It really would be nice if we had the molecular studies back from the bone marrow.  We will have to call Pathology and see if they can put them in the computer system somewhere.  I know that he has problems  that we may not be able to help.  However, we can certainly try to improve his quality of life.  I would like to get him back to see me in about 7 or 8 weeks.    Maude JONELLE Crease, MD 1/14/20261:55 PM "

## 2024-02-16 NOTE — Progress Notes (Signed)
 Called pathology and spoke to [place] regarding MYD88 testing requested on 01/21/2024. She states request was sent to [facility], however they received notice that specimen was exhausted and test was not run.  Dr Timmy notified.   Oncology Nurse Navigator Documentation     02/16/2024    2:45 PM  Oncology Nurse Navigator Flowsheets  Navigator Location [facility]  Navigator Encounter Type Pathology Review;Telephone  Telephone Outgoing Call  Barriers/Navigation Needs Coordination of Care  Interventions Coordination of Care  Coordination of Care Pathology  Time Spent with Patient 15

## 2024-02-17 LAB — KAPPA/LAMBDA LIGHT CHAINS
Kappa free light chain: 16.4 mg/L (ref 3.3–19.4)
Kappa, lambda light chain ratio: 1.37 (ref 0.26–1.65)
Lambda free light chains: 12 mg/L (ref 5.7–26.3)

## 2024-02-17 LAB — IGG, IGA, IGM
IgA: 129 mg/dL (ref 61–437)
IgG (Immunoglobin G), Serum: 525 mg/dL — ABNORMAL LOW (ref 603–1613)
IgM (Immunoglobulin M), Srm: 62 mg/dL (ref 15–143)

## 2024-02-22 LAB — PROTEIN ELECTROPHORESIS, SERUM, WITH REFLEX
A/G Ratio: 1.3 (ref 0.7–1.7)
Albumin ELP: 3.6 g/dL (ref 2.9–4.4)
Alpha-1-Globulin: 0.3 g/dL (ref 0.0–0.4)
Alpha-2-Globulin: 1 g/dL (ref 0.4–1.0)
Beta Globulin: 1 g/dL (ref 0.7–1.3)
Gamma Globulin: 0.5 g/dL (ref 0.4–1.8)
Globulin, Total: 2.8 g/dL (ref 2.2–3.9)
M-Spike, %: 0.1 g/dL — ABNORMAL HIGH
SPEP Interpretation: 0
Total Protein ELP: 6.4 g/dL (ref 6.0–8.5)

## 2024-02-22 LAB — IMMUNOFIXATION REFLEX, SERUM
IgA: 130 mg/dL (ref 61–437)
IgG (Immunoglobin G), Serum: 519 mg/dL — ABNORMAL LOW (ref 603–1613)
IgM (Immunoglobulin M), Srm: 61 mg/dL (ref 15–143)

## 2024-03-08 ENCOUNTER — Encounter: Payer: Self-pay | Admitting: Family Medicine

## 2024-03-08 ENCOUNTER — Ambulatory Visit: Admitting: Family Medicine

## 2024-03-08 NOTE — Progress Notes (Unsigned)
" ° °  Established Patient Office Visit  Patient ID: Steven Patton, male    DOB: [birth year]  Age: 89 y.o. MRN: [id number] PCP: Alvan Dorothyann BIRCH, MD  No chief complaint on file.   Subjective:     HPI  Discussed the use of AI scribe software for clinical note transcription with the patient, who gave verbal consent to proceed.  History of Present Illness    {History (Optional):23778}  ROS    Objective:     There were no vitals taken for this visit. {Vitals History (Optional):23777}  Physical Exam  {PhysExam Abridge (Optional):[id number]} No results found for any visits on 03/08/24.  {Labs (Optional):23779}  The ASCVD Risk score (Arnett DK, et al., 2019) failed to calculate for the following reasons:   The 2019 ASCVD risk score is only valid for ages 55 to 51   * - Cholesterol units were assumed    Assessment & Plan:   Problem List Items Addressed This Visit       Respiratory   COPD (chronic obstructive pulmonary disease) (HCC) - Primary     Endocrine   Controlled type 2 diabetes mellitus with stage 3 chronic kidney disease (HCC)     Genitourinary   CKD stage 3a, GFR 45-59 ml/min (HCC)     Other   Hyperlipidemia    Assessment and Plan Assessment & Plan     No follow-ups on file.    Dorothyann Alvan, MD [facility] at [facility]   "

## 2024-03-27 ENCOUNTER — Inpatient Hospital Stay: Admitting: Hematology & Oncology

## 2024-03-27 ENCOUNTER — Inpatient Hospital Stay

## 2024-04-06 ENCOUNTER — Ambulatory Visit: Admitting: Family Medicine
